# Patient Record
Sex: Female | Born: 1945
Health system: Southern US, Community
[De-identification: ages and names within clinical notes are randomized; demographics above are authoritative.]

## PROBLEM LIST (undated history)

## (undated) DIAGNOSIS — J189 Pneumonia, unspecified organism: Secondary | ICD-10-CM

## (undated) DIAGNOSIS — E669 Obesity, unspecified: Secondary | ICD-10-CM

## (undated) DIAGNOSIS — F329 Major depressive disorder, single episode, unspecified: Secondary | ICD-10-CM

## (undated) DIAGNOSIS — R011 Cardiac murmur, unspecified: Secondary | ICD-10-CM

## (undated) DIAGNOSIS — E039 Hypothyroidism, unspecified: Secondary | ICD-10-CM

## (undated) DIAGNOSIS — I10 Essential (primary) hypertension: Secondary | ICD-10-CM

## (undated) DIAGNOSIS — N200 Calculus of kidney: Secondary | ICD-10-CM

## (undated) DIAGNOSIS — M1990 Unspecified osteoarthritis, unspecified site: Secondary | ICD-10-CM

## (undated) DIAGNOSIS — N3281 Overactive bladder: Secondary | ICD-10-CM

## (undated) DIAGNOSIS — F32A Depression, unspecified: Secondary | ICD-10-CM

## (undated) DIAGNOSIS — K219 Gastro-esophageal reflux disease without esophagitis: Secondary | ICD-10-CM

## (undated) DIAGNOSIS — K5790 Diverticulosis of intestine, part unspecified, without perforation or abscess without bleeding: Secondary | ICD-10-CM

## (undated) DIAGNOSIS — R7301 Impaired fasting glucose: Secondary | ICD-10-CM

## (undated) HISTORY — DX: Major depressive disorder, single episode, unspecified: F32.9

## (undated) HISTORY — DX: Diverticulosis of intestine, part unspecified, without perforation or abscess without bleeding: K57.90

## (undated) HISTORY — DX: Overactive bladder: N32.81

## (undated) HISTORY — DX: Hypothyroidism, unspecified: E03.9

## (undated) HISTORY — DX: Impaired fasting glucose: R73.01

## (undated) HISTORY — DX: Depression, unspecified: F32.A

## (undated) HISTORY — DX: Obesity, unspecified: E66.9

## (undated) HISTORY — PX: APPENDECTOMY: SHX54

## (undated) HISTORY — PX: TONSILLECTOMY: SUR1361

---

## 1973-05-05 HISTORY — PX: THYROIDECTOMY: SHX17

## 1998-01-08 ENCOUNTER — Emergency Department (HOSPITAL_COMMUNITY): Admission: EM | Admit: 1998-01-08 | Discharge: 1998-01-08 | Payer: Self-pay | Admitting: Emergency Medicine

## 1998-10-30 ENCOUNTER — Encounter: Payer: Self-pay | Admitting: Emergency Medicine

## 1998-10-30 ENCOUNTER — Inpatient Hospital Stay (HOSPITAL_COMMUNITY): Admission: EM | Admit: 1998-10-30 | Discharge: 1998-11-02 | Payer: Self-pay | Admitting: Emergency Medicine

## 1998-10-30 ENCOUNTER — Encounter: Payer: Self-pay | Admitting: Specialist

## 2000-05-05 HISTORY — PX: ORIF FOREARM FRACTURE: SHX2124

## 2003-05-23 ENCOUNTER — Encounter: Admission: RE | Admit: 2003-05-23 | Discharge: 2003-05-23 | Payer: Self-pay | Admitting: Family Medicine

## 2004-09-26 ENCOUNTER — Other Ambulatory Visit: Admission: RE | Admit: 2004-09-26 | Discharge: 2004-09-26 | Payer: Self-pay | Admitting: Family Medicine

## 2005-09-02 DIAGNOSIS — R7301 Impaired fasting glucose: Secondary | ICD-10-CM | POA: Insufficient documentation

## 2005-09-02 HISTORY — DX: Impaired fasting glucose: R73.01

## 2005-10-06 ENCOUNTER — Ambulatory Visit: Payer: Self-pay | Admitting: Family Medicine

## 2005-11-10 ENCOUNTER — Ambulatory Visit: Payer: Self-pay | Admitting: Family Medicine

## 2006-01-19 ENCOUNTER — Encounter: Admission: RE | Admit: 2006-01-19 | Discharge: 2006-01-19 | Payer: Self-pay | Admitting: Family Medicine

## 2006-01-19 ENCOUNTER — Ambulatory Visit: Payer: Self-pay | Admitting: Family Medicine

## 2006-05-18 ENCOUNTER — Encounter: Admission: RE | Admit: 2006-05-18 | Discharge: 2006-05-18 | Payer: Self-pay | Admitting: Family Medicine

## 2006-05-18 ENCOUNTER — Ambulatory Visit: Payer: Self-pay | Admitting: Family Medicine

## 2006-06-08 ENCOUNTER — Ambulatory Visit: Payer: Self-pay | Admitting: Family Medicine

## 2006-06-29 ENCOUNTER — Ambulatory Visit (HOSPITAL_COMMUNITY): Admission: RE | Admit: 2006-06-29 | Discharge: 2006-06-29 | Payer: Self-pay | Admitting: Orthopedic Surgery

## 2006-08-10 ENCOUNTER — Ambulatory Visit: Payer: Self-pay | Admitting: Family Medicine

## 2006-11-26 ENCOUNTER — Other Ambulatory Visit: Admission: RE | Admit: 2006-11-26 | Discharge: 2006-11-26 | Payer: Self-pay | Admitting: Family Medicine

## 2006-11-26 ENCOUNTER — Ambulatory Visit: Payer: Self-pay | Admitting: Family Medicine

## 2007-05-20 ENCOUNTER — Ambulatory Visit: Payer: Self-pay | Admitting: Family Medicine

## 2007-06-21 ENCOUNTER — Ambulatory Visit: Payer: Self-pay | Admitting: Family Medicine

## 2007-08-02 ENCOUNTER — Ambulatory Visit: Payer: Self-pay | Admitting: Family Medicine

## 2007-09-06 ENCOUNTER — Ambulatory Visit: Payer: Self-pay | Admitting: Family Medicine

## 2007-12-29 ENCOUNTER — Ambulatory Visit: Payer: Self-pay | Admitting: Family Medicine

## 2008-04-17 ENCOUNTER — Ambulatory Visit: Payer: Self-pay | Admitting: Family Medicine

## 2008-07-27 ENCOUNTER — Ambulatory Visit: Payer: Self-pay | Admitting: Family Medicine

## 2008-11-30 ENCOUNTER — Ambulatory Visit: Payer: Self-pay | Admitting: Family Medicine

## 2009-07-25 ENCOUNTER — Ambulatory Visit: Payer: Self-pay | Admitting: Physician Assistant

## 2009-12-19 ENCOUNTER — Ambulatory Visit: Payer: Self-pay | Admitting: Family Medicine

## 2009-12-27 ENCOUNTER — Ambulatory Visit: Payer: Self-pay | Admitting: Family Medicine

## 2010-01-14 ENCOUNTER — Ambulatory Visit: Payer: Self-pay | Admitting: Physician Assistant

## 2010-03-07 ENCOUNTER — Ambulatory Visit: Payer: Self-pay | Admitting: Family Medicine

## 2010-05-07 ENCOUNTER — Ambulatory Visit: Admit: 2010-05-07 | Payer: Self-pay | Admitting: Family Medicine

## 2010-05-13 ENCOUNTER — Ambulatory Visit
Admission: RE | Admit: 2010-05-13 | Discharge: 2010-05-13 | Payer: Self-pay | Source: Home / Self Care | Attending: Family Medicine | Admitting: Family Medicine

## 2010-08-01 ENCOUNTER — Ambulatory Visit: Payer: Self-pay | Admitting: Family Medicine

## 2010-08-28 ENCOUNTER — Ambulatory Visit: Payer: Self-pay | Admitting: Family Medicine

## 2010-08-29 ENCOUNTER — Ambulatory Visit: Payer: Self-pay | Admitting: Family Medicine

## 2010-09-09 ENCOUNTER — Ambulatory Visit (INDEPENDENT_AMBULATORY_CARE_PROVIDER_SITE_OTHER): Payer: PRIVATE HEALTH INSURANCE | Admitting: Family Medicine

## 2010-09-09 DIAGNOSIS — E039 Hypothyroidism, unspecified: Secondary | ICD-10-CM

## 2010-09-09 DIAGNOSIS — E559 Vitamin D deficiency, unspecified: Secondary | ICD-10-CM

## 2010-09-09 DIAGNOSIS — Z79899 Other long term (current) drug therapy: Secondary | ICD-10-CM

## 2010-09-11 ENCOUNTER — Telehealth: Payer: Self-pay | Admitting: *Deleted

## 2010-09-11 NOTE — Telephone Encounter (Signed)
Spoke with pt ZO:XWRU. Notifed her that her CMP was normal as well as her vit d level. TSH was low, levothyroxine decreased from 200mg  to , called to [facility] [address] @ [phone]. Pt is scheduled for CPE 10/21/10 and we will recheck TSH at that appt.

## 2010-09-23 ENCOUNTER — Telehealth: Payer: Self-pay | Admitting: Family Medicine

## 2010-09-23 ENCOUNTER — Other Ambulatory Visit: Payer: Self-pay | Admitting: Family Medicine

## 2010-09-23 DIAGNOSIS — E039 Hypothyroidism, unspecified: Secondary | ICD-10-CM

## 2010-09-23 MED ORDER — SYNTHROID 175 MCG PO TABS: 175.0000 ug | ORAL_TABLET | Freq: Every day | ORAL | Status: DC

## 2010-09-24 NOTE — Telephone Encounter (Signed)
HANDLED BY DR Lynelle Doctor

## 2010-10-16 ENCOUNTER — Encounter: Payer: Self-pay | Admitting: *Deleted

## 2010-10-21 ENCOUNTER — Encounter: Payer: Self-pay | Admitting: Family Medicine

## 2010-10-21 ENCOUNTER — Ambulatory Visit (INDEPENDENT_AMBULATORY_CARE_PROVIDER_SITE_OTHER): Payer: PRIVATE HEALTH INSURANCE | Admitting: Family Medicine

## 2010-10-21 VITALS — BP 150/80 | HR 68 | Ht 65.0 in | Wt 263.0 lb

## 2010-10-21 DIAGNOSIS — M25561 Pain in right knee: Secondary | ICD-10-CM

## 2010-10-21 DIAGNOSIS — E039 Hypothyroidism, unspecified: Secondary | ICD-10-CM | POA: Insufficient documentation

## 2010-10-21 DIAGNOSIS — R319 Hematuria, unspecified: Secondary | ICD-10-CM

## 2010-10-21 DIAGNOSIS — M25569 Pain in unspecified knee: Secondary | ICD-10-CM

## 2010-10-21 DIAGNOSIS — Z0000 Encounter for general adult medical examination without abnormal findings: Secondary | ICD-10-CM

## 2010-10-21 LAB — POCT URINALYSIS DIPSTICK
Bilirubin, UA: NEGATIVE
Glucose, UA: NEGATIVE
Ketones, UA: NEGATIVE
Spec Grav, UA: 1.01
Urobilinogen, UA: NEGATIVE

## 2010-10-21 NOTE — Progress Notes (Signed)
  Subjective:    Patient ID: Katie Kaiser, female    DOB: 09/01/1945, 65 y.o.   MRN: [id number]  HPI Patient was originally scheduled for a CPE, but is running short on time and would prefer to get her knee injected today rather than her physical.  She is also here to get repeat thyroid bloodwork.  Dose was decreased to 6 weeks ago.  Denies any change in how she feels since lowering the dose.  Has had urinary leakage/incontinence at night for the last few weeks.  Getting up 3 times per night to void, which is typical for her.  Denies dysuria, fevers.  No h/o hematuria in the past.  Complaining of Right knee pain.  Stopped taking the Naproxen because she found the tylenol arthritis to be just as helpful.  Helps ease the pain, but still is limping, and would like cortisone shot.  Has had 2 cortisone shots previously with good results.  Hasn't been able to exercise due to the pain.  Previously loved to use the elliptical and she would like to restart.  Last knee x-ray was in 2005 (recommended to have more recently, but patient never did).  Showed mild medial degenerative changes   Review of Systems Weight gain noted by [place] (not by her).  Denies GI problems, fatigue, palpitations, fevers, skin concerns.  + knee pain    Objective:   Physical Exam BP 150/80  Pulse 68  Ht 5\' 5"  (1.651 m)  Wt 263 lb (119.296 kg)  BMI 43.77 kg/m2  LMP 05/05/1990 Well developed female, does not appear to be in any distress while sitting, moderate discomfort and limping with walking Back:  No CVA tenderness or spinal tenderness Abdomen:  No suprapubic tenderness Knees:  No effusions, warmth.  Mild crepitus on R.  FROM.  No pain with ROM, valgus or varus stress, no pain at joint lines.  Neuro:  Antalgic gait, otherwise grossly intact       Assessment & Plan:   1. Knee pain, right  PR DRAIN/INJECT LARGE JOINT/BURSA   known mild degenerative changes (last x-ray 2005)  2. Unspecified hypothyroidism  TSH    due for repeat TSH since dose adjusted 6 weeks ago.   3. Hematuria  Urine Culture  4. Routine general medical examination at a health care facility  POCT urinalysis dipstick, Visual acuity screening   R knee pain--likely due to arthritis.  Pt desires cortisone shot--this will be third injection for this joint.  Will need referral to ortho for evaluation if having ongoing pain.  Encouraged weight loss and elliptical or stationary bicycle for exercise.  Procedure note:  Patient verbally consented to procedure, stating understanding of risks and alternatives.  Right superolateral knee was cleansed with betadine swabs x 3, then used Ethyl Chloride to cool the skin.  Injected 30mg  of Kenalog and 4cc of 2% lidocaine and 4cc of Marcaine into R knee joint.  Patient tolerated the procedure without complications.  She noted significant improvement in her pain after injection, and walked normally down the hall to [employer].  Hematuria--some change in bladder symptoms.  Rule out infection.  Hypothyroidism: Has 1 pill left.  Takes branded Synthroid--will need refill sent to [facility] on [address] when results come in tomorrow--done

## 2010-10-21 NOTE — Patient Instructions (Signed)
Wear and Tear Disorders of the Knee Arthritis, Osteoarthritis Everyone will experience wear and tear injuries (arthritis, osteoarthritis) of the knee. These are the changes we all get as we age. They come from the joint stress of daily living. The amount of cartilage damage in your knee and your symptoms determine if you need surgery. Mild problems require approximately two months recovery time. More severe problems take several months to recover. With mild problems, your surgeon may find worn and rough cartilage surfaces. With severe changes, your surgeon may find cartilage that has completely worn away and exposed the bone. Loose bodies of bone and cartilage, bone spurs (excess bone growth), and injuries to the menisci (cushions between the large bones of your leg) are also common. All of these problems can cause pain. For a mild wear and tear problem, rough cartilage may simply need to be shaved and smoothed. For more severe problems with areas of exposed bone, your surgeon may use an instrument for roughing up the bone surfaces to stimulate new cartilage growth. Loose bodies are most often removed. Torn menisci may be trimmed or repaired.     CALL us IMMEDIATELY IF YOU DEVELOP ANY REDNESS OR WARMTH AT INJECTION SITE, OR  FEVER >101

## 2010-10-22 ENCOUNTER — Telehealth: Payer: Self-pay | Admitting: *Deleted

## 2010-10-22 LAB — TSH: TSH: 3.207 u[IU]/mL (ref 0.350–4.500)

## 2010-10-22 MED ORDER — SYNTHROID 175 MCG PO TABS: 175.0000 ug | ORAL_TABLET | Freq: Every day | ORAL | Status: DC

## 2010-10-22 NOTE — Telephone Encounter (Signed)
Pt notified that TSH was normal and rx for synthroid was sent to pharmacy.

## 2010-10-22 NOTE — Telephone Encounter (Signed)
Message copied by Melonie [place] on Tue Oct 22, 2010  8:34 AM ------      Message from: Joselyn Arrow      Created: Tue Oct 22, 2010  8:18 AM       Advise pt TSH normal and that I sent rx refill to pharmacy.  Plan to re-check in about 3 months (will discuss at her CPE)

## 2010-10-23 LAB — URINE CULTURE: Colony Count: 50000

## 2010-10-24 ENCOUNTER — Telehealth: Payer: Self-pay | Admitting: *Deleted

## 2010-10-24 NOTE — Telephone Encounter (Signed)
Advise--culture showed some bacteria, but a few different types, NOT indicative of an infection.  Doesn't require ABX.  Will re-check u/a at her next visit

## 2010-10-24 NOTE — Telephone Encounter (Signed)
Patient called requesting her urine culture results.

## 2010-10-24 NOTE — Telephone Encounter (Signed)
Patient notified that urine culture showed bacteria, but nothing specific to infection that would constitute ABX treatment. Dr.Knapp will recheck at patient's already scheduled CPE. Patient verbalized understanding.

## 2010-11-14 ENCOUNTER — Emergency Department (HOSPITAL_COMMUNITY)
Admission: EM | Admit: 2010-11-14 | Discharge: 2010-11-14 | Disposition: A | Discharge status: Home / Self Care | Payer: PRIVATE HEALTH INSURANCE | Attending: Emergency Medicine | Admitting: Emergency Medicine

## 2010-11-14 ENCOUNTER — Emergency Department (HOSPITAL_COMMUNITY): Payer: PRIVATE HEALTH INSURANCE

## 2010-11-14 DIAGNOSIS — X58XXXA Exposure to other specified factors, initial encounter: Secondary | ICD-10-CM | POA: Insufficient documentation

## 2010-11-14 DIAGNOSIS — E0590 Thyrotoxicosis, unspecified without thyrotoxic crisis or storm: Secondary | ICD-10-CM | POA: Insufficient documentation

## 2010-11-14 DIAGNOSIS — S9030XA Contusion of unspecified foot, initial encounter: Secondary | ICD-10-CM | POA: Insufficient documentation

## 2010-11-14 DIAGNOSIS — M7730 Calcaneal spur, unspecified foot: Secondary | ICD-10-CM | POA: Insufficient documentation

## 2010-11-14 DIAGNOSIS — Y93H9 Activity, other involving exterior property and land maintenance, building and construction: Secondary | ICD-10-CM | POA: Insufficient documentation

## 2010-11-18 ENCOUNTER — Encounter: Payer: Self-pay | Admitting: Family Medicine

## 2010-11-18 ENCOUNTER — Ambulatory Visit (INDEPENDENT_AMBULATORY_CARE_PROVIDER_SITE_OTHER): Payer: PRIVATE HEALTH INSURANCE | Admitting: Family Medicine

## 2010-11-18 ENCOUNTER — Ambulatory Visit: Payer: PRIVATE HEALTH INSURANCE | Admitting: Family Medicine

## 2010-11-18 VITALS — BP 140/80 | HR 72 | Ht 65.0 in | Wt 249.0 lb

## 2010-11-18 DIAGNOSIS — M722 Plantar fascial fibromatosis: Secondary | ICD-10-CM

## 2010-11-18 NOTE — Progress Notes (Signed)
Patient presents with complaint of L heel pain and bone spur.  See recent ER visit.  Has had pain in L foot for about 3 weeks.  Hurts in the heel with walking.  Denies pain upon getting out of bed in the morning.  Seems worse as she is on her feet throughout the day, and is limping by the end of the day.  She has tried icing the foot (temporarily helps), and was rx'd 800mg  of ibuprofen in ER, and has been taking three times daily since ER visit 4-5 days ago.  She doesn't see much improvement.  She was also given Vicodin, which she takes at bedtime, when pain is at its worst.   She wears [employer], which has a good arch support, and has been using a heel cup without benefit.  She would like a cortisone shot today.  Her knee pain has resolved s/p the last cortisone injection at her last visit here.  Past Medical History  Diagnosis Date  . Hypothyroid   . Depression   . Impaired fasting glucose 5/07  . Obesity   . Vitamin D deficiency     Past Surgical History  Procedure Date  . Thyroidectomy 1975  . Tonsillectomy   . Appendectomy   . Cesarean section     History   Social History  . Marital Status: Divorced    Spouse Name: N/A    Number of Children: N/A  . Years of Education: N/A   Occupational History  . Not on file.   Social History Main Topics  . Smoking status: Former Smoker    Quit date: 05/05/1978  . Smokeless tobacco: Not on file  . Alcohol Use: Yes     1-2 wine coolers once a week  . Drug Use: No  . Sexually Active: Not on file   Other Topics Concern  . Not on file   Social History Narrative  . No narrative on file    No family history on file.  Current outpatient prescriptions:Cholecalciferol (VITAMIN D) 1000 UNITS capsule, Take 1,000 Units by mouth daily.  , Disp: , Rfl: ;  fish oil-omega-3 fatty acids 1000 MG capsule, Take 2 g by mouth daily.  , Disp: , Rfl: ;  FLUoxetine (PROZAC) 20 MG tablet, Take 20 mg by mouth daily.  , Disp: , Rfl: ;   HYDROcodone-acetaminophen (VICODIN) 5-500 MG per tablet, Take 1 tablet by mouth at bedtime.  , Disp: , Rfl:  ibuprofen (ADVIL,MOTRIN) 800 MG tablet, Take 800 mg by mouth 3 (three) times daily.  , Disp: , Rfl: ;  SYNTHROID 175 MCG tablet, Take 1 tablet (175 mcg total) by mouth daily., Disp: 30 tablet, Rfl: 5  No Known Allergies  ROS: no fever, rash, other joint pain.  She has lost 14 pounds.  Denies GI problems or other concerns. She is scheduled for CPE later this week  PHYSICAL EXAM: BP 140/80  Pulse 72  Ht 5\' 5"  (1.651 m)  Wt 249 lb (112.946 kg)  BMI 41.44 kg/m2  LMP 05/05/1990 Well developed, pleasant, obese female in no distress at rest.  Limping and moderate discomfort during exam L foot--2+ pulse.  No edema.  Tender at plantar fascia insertion at medial calcaneus.   She brings in copy of her x-ray, showing calcaneal bone spur  Procedure:  Medial aspect of foot was cleansed with alcohol x 3.  Then injected 20mg  of Kenalog, along with 1cc of 2% lidocaine without epi and 1cc of Marcaine.  Injected through the medial aspect of the foot, aimed towards the point of maximal tenderness over medial calcaneous.  Patient tolerated the procedure well, with improvement of pain on direct palpation afterwards.  ASSESSMENT/PLAN: 1. Plantar fasciitis  PR INJECT TENDON SHEATH/LIGAMENT   left   S/p Cortisone shot today.  Continue heel cup.  Shown PF stretches.  Will refer to podiatrist if having ongoing problems  F/u as scheduled for CPE later this week

## 2010-11-18 NOTE — Patient Instructions (Signed)

## 2010-11-21 ENCOUNTER — Ambulatory Visit (INDEPENDENT_AMBULATORY_CARE_PROVIDER_SITE_OTHER): Payer: PRIVATE HEALTH INSURANCE | Admitting: Family Medicine

## 2010-11-21 ENCOUNTER — Other Ambulatory Visit (HOSPITAL_COMMUNITY)
Admission: RE | Admit: 2010-11-21 | Discharge: 2010-11-21 | Disposition: A | Discharge status: Home / Self Care | Payer: PRIVATE HEALTH INSURANCE | Source: Ambulatory Visit | Attending: Family Medicine | Admitting: Family Medicine

## 2010-11-21 ENCOUNTER — Encounter: Payer: Self-pay | Admitting: Family Medicine

## 2010-11-21 VITALS — BP 146/80 | HR 72 | Ht 65.0 in | Wt 250.0 lb

## 2010-11-21 DIAGNOSIS — M722 Plantar fascial fibromatosis: Secondary | ICD-10-CM

## 2010-11-21 DIAGNOSIS — Z0000 Encounter for general adult medical examination without abnormal findings: Secondary | ICD-10-CM

## 2010-11-21 DIAGNOSIS — R030 Elevated blood-pressure reading, without diagnosis of hypertension: Secondary | ICD-10-CM

## 2010-11-21 DIAGNOSIS — E039 Hypothyroidism, unspecified: Secondary | ICD-10-CM

## 2010-11-21 DIAGNOSIS — IMO0001 Reserved for inherently not codable concepts without codable children: Secondary | ICD-10-CM

## 2010-11-21 DIAGNOSIS — E559 Vitamin D deficiency, unspecified: Secondary | ICD-10-CM

## 2010-11-21 DIAGNOSIS — Z01419 Encounter for gynecological examination (general) (routine) without abnormal findings: Secondary | ICD-10-CM | POA: Insufficient documentation

## 2010-11-21 LAB — POCT URINALYSIS DIPSTICK
Bilirubin, UA: NEGATIVE
Blood, UA: NEGATIVE
Nitrite, UA: NEGATIVE
Protein, UA: NEGATIVE
pH, UA: 6

## 2010-11-21 LAB — LIPID PANEL
HDL: 50 mg/dL (ref >39)
LDL Cholesterol: 94 mg/dL (ref 0–99)

## 2010-11-21 MED ORDER — HYDROCODONE-ACETAMINOPHEN 5-500 MG PO TABS: 1.0000 | ORAL_TABLET | Freq: Four times a day (QID) | ORAL | Status: DC | PRN

## 2010-11-21 NOTE — Progress Notes (Signed)
Katie Kaiser is a 65 y.o. female who presents for a complete physical.  She has the following concerns: f/u L plantar fasciitis/heel spur.  Had injection Monday, some of the pain is improved, but still having significant pain.  Would like refill on Vicodin--helped to take at bedtime (doesn't take during the day).  Taking Aleve 2 pills BID during the day.   Immunization History  Administered Date(s) Administered  . DTaP 08/02/2007  . Td 10/09/1997  Gets flu shots annually Last Pap smear: 7/08 Last mammogram: 08/2010 Last colonoscopy: greater than 10 years ago (when hospitalized) Last DEXA: never Ophtho: never Dentist: 1.5 years ago Exercise: limited by pain from PF.  On her feet all day working as Producer, television/film/video  C-met, and Vitamin D were done and normal in May 2012.  TSH normal in 10/2010 (after dose adjustment)  Past Medical History  Diagnosis Date  . Hypothyroid   . Depression   . Impaired fasting glucose 5/07  . Obesity   . Vitamin D deficiency     Past Surgical History  Procedure Date  . Thyroidectomy 1975  . Tonsillectomy   . Appendectomy   . Cesarean section   . Orif forearm fracture 2002    History   Social History  . Marital Status: Divorced    Spouse Name: N/A    Number of Children: N/A  . Years of Education: N/A   Occupational History  . Not on file.   Social History Main Topics  . Smoking status: Former Smoker    Quit date: 05/05/1978  . Smokeless tobacco: Never Used  . Alcohol Use: No  . Drug Use: No  . Sexually Active: Not Currently   Other Topics Concern  . Not on file   Social History Narrative  . No narrative on file    Family History  Problem Relation Age of Onset  . Parkinsonism Mother   . Diabetes Brother   . Heart disease Brother   . Cancer Neg Hx   . HIV Brother    Current Outpatient Prescriptions on File Prior to Visit  Medication Sig Dispense Refill  . Cholecalciferol (VITAMIN D) 1000 UNITS capsule Take 1,000 Units by  mouth daily.        . fish oil-omega-3 fatty acids 1000 MG capsule Take 2 g by mouth daily.        Marland Kitchen FLUoxetine (PROZAC) 20 MG tablet Take 20 mg by mouth daily.        Marland Kitchen SYNTHROID 175 MCG tablet Take 1 tablet (175 mcg total) by mouth daily.  30 tablet  5  . ibuprofen (ADVIL,MOTRIN) 800 MG tablet Take 800 mg by mouth 3 (three) times daily.          No Known Allergies  ROS: The patient denies anorexia, fever,  headaches, decreased hearing, ear pain, sore throat, breast concerns, chest pain, palpitations, dizziness, syncope, dyspnea on exertion, cough, swelling, nausea, vomiting, diarrhea, constipation, abdominal pain, melena, hematochezia, indigestion/heartburn, hematuria, incontinence, dysuria, vaginal bleeding, discharge, odor or itch, genital lesions, joint pains, numbness, tingling, weakness, tremor, suspicious skin lesions, depression, anxiety, abnormal bleeding/bruising, or enlarged lymph nodes.  Urinary incontinence improved since cutting back fluids in the evenings  PHYSICAL EXAM: BP 146/80  Pulse 72  Ht 5\' 5"  (1.651 m)  Wt 250 lb (113.399 kg)  BMI 41.60 kg/m2  LMP 05/05/1990  General Appearance:    Alert, cooperative, obese female in no distress, appears stated age. Exam interrupted 3 times by phone calls which patient  answered  Head:    Normocephalic, without obvious abnormality, atraumatic  Eyes:    PERRL, conjunctiva/corneas clear, EOM's intact, fundi    benign  Ears:    Normal TM's and external ear canals  Nose:   Nares normal, mucosa normal, no drainage or sinus   tenderness  Throat:   Lips, mucosa, and tongue normal; teeth and gums normal  Neck:   Supple, no lymphadenopathy;  thyroid:  no   enlargement/tenderness/nodules; no carotid   bruit or JVD  Back:    Spine nontender, no curvature, ROM normal, no CVA     tenderness  Lungs:     Clear to auscultation bilaterally without wheezes, rales or     ronchi; respirations unlabored  Chest Wall:    No tenderness or deformity    Heart:    Regular rate and rhythm, S1 and S2 normal, no murmur, rub   or gallop  Breast Exam:    No tenderness, masses, or nipple discharge or inversion.      No axillary lymphadenopathy  Abdomen:     Soft, obese, non-tender, nondistended, normoactive bowel sounds, no masses, no hepatosplenomegaly  Genitalia:    Normal external genitalia without lesions.  BUS and vagina normal; cervix without lesions, or cervical motion tenderness. No abnormal vaginal discharge.  Exam limited by body habitus--no pelvic masses appreciable. Pap performed  Rectal:    Normal tone, no masses or tenderness; guaiac negative stool  Extremities:   No clubbing, cyanosis or edema.  No tenderness at PF insertion at calcaneous  Pulses:   2+ and symmetric all extremities  Skin:   Skin color, texture, turgor normal, no rashes or lesions  Lymph nodes:   Cervical, supraclavicular, and axillary nodes normal  Neurologic:   CNII-XII intact, normal strength, sensation and gait; reflexes 2+ and symmetric throughout          Psych:   Normal mood, affect, hygiene and grooming.    ASSESSMENT/PLAN: 1. Routine general medical examination at a health care facility  Visual acuity screening, POCT urinalysis dipstick, Ambulatory referral to Gastroenterology, Lipid panel, Cytology - PAP  2. Unspecified vitamin D deficiency  DG Bone Density  3. Unspecified hypothyroidism  DG Bone Density  4. Plantar fasciitis  HYDROcodone-acetaminophen (VICODIN) 5-500 MG per tablet  5. Elevated BP     PF--improved; nontender on exam today.  rx Vicodin since patient still having significant pain at night.  Expect to need short-term only.  Continue with Aleve 2 BID during day prn  Elevated BP--discussed need for low sodium diet, daily exercise and weight loss.  Check BP elsewhere Eye exam recommended (poor vision today; routine screening Routine dental visits recommended TSH due in September or October Colonoscopy referral [facility] D[facility] at [facility]  Discussed  monthly self breast exams and yearly mammograms after the age of 35; at least 30 minutes of aerobic activity at least 5 days/week; proper sunscreen use reviewed; healthy diet, including goals of calcium and vitamin D intake and alcohol recommendations (less than or equal to 1 drink/day) reviewed; regular seatbelt use; changing batteries in smoke detectors.  Immunization recommendations discussed--flu shots annually.  Will have pt look into insurance coverage for Zostavax.  Colonoscopy recommendations reviewed--pt willing to have now (past due)

## 2010-11-21 NOTE — Patient Instructions (Addendum)
Low sodium diet; start exercising; continue weight loss.  Check BP at pharmacy at least once a week if you can;  Follow up here in 2 months with list of blood pressures (we'll also do thyroid testing at visit)  Exercise 30-6 minutes daily, at least 5 days Ophtho (poor vision today; routine screening)--please schedule an eye appointment Routine dental visits recommended TSH in September or October We will be scheduling colonoscopy for you We will be scheduling Bone Density test for you   2 Gram Low Sodium Diet A 2 gram sodium diet restricts the amount of sodium in the diet to no more than 2 grams (g) or 2000 milligrams (mg) daily. Limiting the amount of sodium is often used to help lower blood pressure. It is important if you have heart, liver, or kidney problems. Many foods contain sodium for flavor and sometimes as a preservative. When the amount of sodium in a diet needs to be low, it is important to know what to look for when choosing foods and drinks. The following includes some information and guidelines to help make it easier for you to adapt to a low sodium diet. QUICK TIPS  Do not add salt to food.   Avoid convenience items and fast food.   Choose unsalted snack foods.   Buy lower sodium products, often labeled as "lower sodium" or "no salt added."   Check food labels to learn how much sodium is in 1 serving.   When eating at a restaurant, ask that your food be prepared with less salt or none, if possible.  READING FOOD LABELS FOR SODIUM INFORMATION The nutrition facts label is a good place to find how much sodium is in foods. Look for products with no more than 500 to 600 mg of sodium per meal and no more than 150 mg per serving. Remember that 2 g = 2000 mg. The food label may also list foods as:  Sodium-free: Less than 5 mg in a serving.   Very low sodium: 35 mg or less in a serving.   Low-sodium: 140 mg or less in a serving.   Light in sodium: 50% less sodium in a  serving. For example, if a food that usually has 300 mg of sodium is changed to become light in sodium, it will have 150 mg of sodium.   Reduced sodium: 25% less sodium in a serving. For example, if a food that usually has 400 mg of sodium is changed to reduced sodium, it will have 300 mg of sodium.  CHOOSING FOODS AVOID CHOOSE  Grains: Salted crackers and snack items. Some cereals, including instant hot cereals. Bread stuffing and biscuit mixes. Seasoned rice or pasta mixes. Grains: Unsalted snack items. Low-sodium cereals, oats, puffed wheat and rice, shredded wheat. English muffins and bread. Pasta.  Meats: Salted, canned, smoked, spiced, or pickled meats, including fish and poultry. Bacon, ham, sausage, cold cuts, hot dogs, anchovies. Meats: Low-sodium canned tuna and salmon. Fresh or frozen meat, poultry, and fish.  Dairy: Processed cheese and spreads. Cottage cheese. Buttermilk and condensed milk. Regular cheese. Dairy: Milk. Low-sodium cottage cheese. Yogurt. Sour cream. Low-sodium cheese.  Fruits and Vegetables: Regular canned vegetables. Regular canned tomato sauce and paste. Frozen vegetables in sauces. Olives. Rosita Fire. Relishes. Sauerkraut. Fruits and Vegetables: Low-sodium canned vegetables. Low-sodium tomato sauce and paste. Fresh and frozen vegetables. Fresh and frozen fruit.  Condiments: Canned and packaged gravies. Worcestershire sauce. Tartar sauce. Barbecue sauce. Soy sauce. Steak sauce. Ketchup. Onion, garlic, and table salt.  Meat flavorings and tenderizers. Condiments: Fresh and dried herbs and spices. Low-sodium varieties of mustard and ketchup. Lemon juice. Tabasco sauce. Horseradish.  SAMPLE 2 GRAM SODIUM MEAL PLAN Meal Foods Sodium (mg)  Breakfast 1 cup low-fat milk 2 slices whole-wheat toast 1 tablespoon heart-healthy margarine 1 hard-boiled egg 1 small orange [phone]  Lunch 1 cup raw carrots  cup hummus 1 cup  low-fat milk  cup red grapes 1 whole-wheat pita bread [phone]  Dinner 1 cup whole-wheat pasta 1 cup low-sodium tomato sauce 3 ounces lean ground beef 1 small side salad (1 cup raw spinach leaves,  cup cucumber,  cup yellow bell pepper) with 1 teaspoon olive oil and 1 teaspoon red wine vinegar 2 73 [phone] 25  Snack 1 container low-fat vanilla yogurt 3 graham cracker squares 107 127  Nutrient Analysis Calories: 2033 Protein: 77 g Carbohydrate: 282 g Fat: 72 g Sodium: 1971 mg Document Released: 04/21/2005 Document Re-Released: 10/09/2009 [facility] Patient Information 2011 [place], [place].

## 2010-11-22 ENCOUNTER — Telehealth: Payer: Self-pay

## 2010-11-22 NOTE — Telephone Encounter (Signed)
Left message for pt to call me back also faxed for dexa at  [facility] they will contact pt and set up appt

## 2010-11-22 NOTE — Telephone Encounter (Signed)
Pt called and has been informed and mailed copy

## 2010-11-23 LAB — HM PAP SMEAR: HM PAP: NORMAL

## 2010-11-26 ENCOUNTER — Encounter: Payer: Self-pay | Admitting: Family Medicine

## 2010-12-16 ENCOUNTER — Telehealth: Payer: Self-pay | Admitting: Family Medicine

## 2010-12-16 NOTE — Telephone Encounter (Signed)
Okay to refer to Dr. Al Corpus (he is with [facility]) for plantar fasciitis

## 2010-12-16 NOTE — Telephone Encounter (Signed)
Shot helped for 2 days, but not now.  Pt wants referral to foot specialist.  Someone told her about Dr. Al Corpus, she would like to be referred to Dr. Al Corpus if you think this would be a good fit.

## 2011-01-13 ENCOUNTER — Other Ambulatory Visit: Payer: PRIVATE HEALTH INSURANCE | Admitting: Internal Medicine

## 2011-01-22 ENCOUNTER — Ambulatory Visit: Payer: PRIVATE HEALTH INSURANCE | Admitting: Family Medicine

## 2011-01-29 ENCOUNTER — Ambulatory Visit (AMBULATORY_SURGERY_CENTER): Payer: PRIVATE HEALTH INSURANCE | Admitting: *Deleted

## 2011-01-29 VITALS — Ht 65.0 in | Wt 249.6 lb

## 2011-01-29 DIAGNOSIS — Z1211 Encounter for screening for malignant neoplasm of colon: Secondary | ICD-10-CM

## 2011-01-29 MED ORDER — PEG-KCL-NACL-NASULF-NA ASC-C 100 G PO SOLR: 1.0000 | Freq: Once | ORAL | Status: DC

## 2011-02-03 ENCOUNTER — Ambulatory Visit: Payer: PRIVATE HEALTH INSURANCE | Admitting: Family Medicine

## 2011-02-04 ENCOUNTER — Ambulatory Visit (AMBULATORY_SURGERY_CENTER): Payer: PRIVATE HEALTH INSURANCE | Admitting: Internal Medicine

## 2011-02-04 ENCOUNTER — Encounter: Payer: Self-pay | Admitting: Internal Medicine

## 2011-02-04 ENCOUNTER — Other Ambulatory Visit: Payer: PRIVATE HEALTH INSURANCE | Admitting: Internal Medicine

## 2011-02-04 VITALS — BP 132/66 | HR 67 | Temp 97.8°F | Resp 18 | Ht 66.0 in | Wt 249.0 lb

## 2011-02-04 DIAGNOSIS — Z1211 Encounter for screening for malignant neoplasm of colon: Secondary | ICD-10-CM

## 2011-02-04 HISTORY — PX: COLONOSCOPY: SHX174

## 2011-02-04 LAB — HM COLONOSCOPY

## 2011-02-04 MED ORDER — SODIUM CHLORIDE 0.9 % IV SOLN: 500.0000 mL | INTRAVENOUS | Status: DC

## 2011-02-04 NOTE — Progress Notes (Signed)
Pt was uncomfortable with the scope advancement.  Medication was titrated per Dr. Marvell Fuller orders.  Pt did relax and rested comfortably as the scope was withdrawn.  Pt's eyes closed and sleeping. maw

## 2011-02-04 NOTE — Patient Instructions (Addendum)
The colonoscopy was normal - no polyps or cancer were seen. You should have a routine repeat colonoscopy in 10 years. Iva Boop, MD, [facility] Resume all medications.

## 2011-02-05 ENCOUNTER — Telehealth: Payer: Self-pay | Admitting: *Deleted

## 2011-02-05 NOTE — Telephone Encounter (Signed)
Follow up Call- Patient questions:From beginning to the end of the procedure, everyone was wonderful.  Please thank everyone for me for my wonderful care   Do you have a fever, pain , or abdominal swelling? no Pain Score  0 *  Have you tolerated food without any problems? yes  Have you been able to return to your normal activities? yes  Do you have any questions about your discharge instructions: Diet   no Medications  no Follow up visit  no  Do you have questions or concerns about your Care? no  Actions: * If pain score is 4 or above: No action needed, pain <4.

## 2011-02-12 ENCOUNTER — Telehealth: Payer: Self-pay | Admitting: *Deleted

## 2011-02-12 NOTE — Telephone Encounter (Signed)
Patient called and wanted to let you know that she has to work late tomorrow evening so she will not be able to make her 3pm appt tomorrow. She wants you to know that everything is ok, she is doing well and she will reschedule to see you in the next 3 weeks sometime. Thanks.

## 2011-02-12 NOTE — Telephone Encounter (Signed)
noted 

## 2011-02-13 ENCOUNTER — Ambulatory Visit: Payer: PRIVATE HEALTH INSURANCE | Admitting: Family Medicine

## 2011-03-17 ENCOUNTER — Encounter: Payer: Self-pay | Admitting: Family Medicine

## 2011-03-17 ENCOUNTER — Ambulatory Visit (INDEPENDENT_AMBULATORY_CARE_PROVIDER_SITE_OTHER): Payer: PRIVATE HEALTH INSURANCE | Admitting: Family Medicine

## 2011-03-17 VITALS — BP 130/78 | HR 74 | Wt 254.0 lb

## 2011-03-17 DIAGNOSIS — S63509A Unspecified sprain of unspecified wrist, initial encounter: Secondary | ICD-10-CM

## 2011-03-17 DIAGNOSIS — IMO0002 Reserved for concepts with insufficient information to code with codable children: Secondary | ICD-10-CM

## 2011-03-17 DIAGNOSIS — S93409A Sprain of unspecified ligament of unspecified ankle, initial encounter: Secondary | ICD-10-CM

## 2011-03-17 DIAGNOSIS — S8390XA Sprain of unspecified site of unspecified knee, initial encounter: Secondary | ICD-10-CM

## 2011-03-17 DIAGNOSIS — Z2911 Encounter for prophylactic immunotherapy for respiratory syncytial virus (RSV): Secondary | ICD-10-CM

## 2011-03-17 DIAGNOSIS — S43409A Unspecified sprain of unspecified shoulder joint, initial encounter: Secondary | ICD-10-CM

## 2011-03-17 NOTE — Patient Instructions (Signed)
Use heat for 20 minutes 3 or 4 times a day. Continue on your ibuprofen and this should slowly go.

## 2011-03-17 NOTE — Progress Notes (Signed)
  Subjective:    Patient ID: Katie Kaiser, female    DOB: 02/14/1946, 65 y.o.   MRN: [id number]  HPI On November 7 she fell wall and a trip to [place] injuring her left shoulder,arm and left knee and ankle. He went to the emergency room. They did evaluate her knee and found some degenerative changes apparently did not evaluate other problem areas. She has been using ice packs as well as ibuprofen 800 mg 3 times a day and getting relief   Review of Systems     Objective:   Physical Exam Alert and in no distress. Left shoulder exam shows good motion with some palpable tenderness over the deltoid. No laxity noted. Full motion of the elbow. She is tender proximal to the wrist but no swelling in wrist itself. Good strength of her arms and hands. Left knee exam shows no effusion or laxity. Right ankle does have some slight swelling near lateral malleolus with good motion and strength.       Assessment & Plan:  Shoulder, wrist, knee and ankle sprain Supportive care with heat and ibuprofen. Continue with present medications. Zostavax also given.

## 2011-04-02 ENCOUNTER — Encounter: Payer: Self-pay | Admitting: Family Medicine

## 2011-04-02 ENCOUNTER — Ambulatory Visit (INDEPENDENT_AMBULATORY_CARE_PROVIDER_SITE_OTHER): Payer: PRIVATE HEALTH INSURANCE | Admitting: Family Medicine

## 2011-04-02 DIAGNOSIS — E039 Hypothyroidism, unspecified: Secondary | ICD-10-CM

## 2011-04-02 LAB — TSH: TSH: 1.34 u[IU]/mL (ref 0.350–4.500)

## 2011-04-02 NOTE — Patient Instructions (Signed)
We have referred you to [facility] to discuss weight loss surgical options. Continue to try and get at least 30 minutes of exercise daily (goal is 5-6 days/week), and continue lowfat diet  We are checking your thyroid today, and will notify you of results, and adjust meds if necessary

## 2011-04-02 NOTE — Progress Notes (Signed)
Patient presents for f/u on blood pressure and thyroid, and to discuss lap banding.  She had some borderline/elevated blood pressures at previous visit, but most recently they have been improved. She states that she thyroid medication dose was lowered from 200 to 175 mcg back in early May, and has felt more fatigued since then.  TSH was re-checked in mid-June and was normal.  She is very disappointed about her weight, stating that her weight, and inability to lose it, is causing her to feel depressed.  She denies depression other than related to her weight.  She wants help losing weight.  In the past she has tried various diets, took weight loss pills, joined gyms.  She lost some of the weight, but regains it.  She is now interested in pursuing lap band surgery.  She has not yet attended the seminar by [facility].  She fell 3 weeks ago while in [place], and injured the left side of her body.  Has known arthritis in her R knee. Seeing Dr. Darrelyn Hillock for her ongoing arm pain from the fall.  Prior to injury, was going to the gym 3-4 times/week, and going 4 miles on the elliptical.  She isn't snacking, doesn't eat after 6pm, avoids fried foods, and tries to avoid white foods/carbs.    Past Medical History  Diagnosis Date  . Hypothyroid   . Depression   . Impaired fasting glucose 5/07  . Obesity   . Vitamin D deficiency     Past Surgical History  Procedure Date  . Thyroidectomy 1975  . Tonsillectomy   . Appendectomy   . Cesarean section   . Orif forearm fracture 2002  . Colonoscopy 02/04/2011    diverticulosis    History   Social History  . Marital Status: Divorced    Spouse Name: N/A    Number of Children: N/A  . Years of Education: N/A   Occupational History  . Not on file.   Social History Main Topics  . Smoking status: Former Smoker    Quit date: 05/05/1978  . Smokeless tobacco: Never Used  . Alcohol Use: No  . Drug Use: No  . Sexually Active: Not Currently   Other Topics Concern  .  Not on file   Social History Narrative  . No narrative on file    Family History  Problem Relation Age of Onset  . Parkinsonism Mother   . Diabetes Brother   . Heart disease Brother   . Cancer Neg Hx   . Colon cancer Neg Hx   . Stomach cancer Neg Hx   . HIV Brother    Current Outpatient Prescriptions on File Prior to Visit  Medication Sig Dispense Refill  . aspirin 81 MG tablet Take 81 mg by mouth daily.        . Cholecalciferol (VITAMIN D) 1000 UNITS capsule Take 1,000 Units by mouth daily.        . fish oil-omega-3 fatty acids 1000 MG capsule Take 2 g by mouth daily.        Marland Kitchen FLUoxetine (PROZAC) 20 MG tablet Take 20 mg by mouth daily.        Marland Kitchen ibuprofen (ADVIL,MOTRIN) 800 MG tablet Take 800 mg by mouth 3 (three) times daily.        Marland Kitchen SYNTHROID 175 MCG tablet Take 1 tablet (175 mcg total) by mouth daily.  30 tablet  5   No Known Allergies  ROS:  +fatigue, weight gain.  Denies hair/skin/bowel changes.  Denies  chest pain, headaches, dizziness, GI complaints. + knee and L sided pain from recent fall  PHYSICAL EXAM: BP 132/86  Pulse 72  Ht 5\' 5"  (1.651 m)  Wt 256 lb (116.121 kg)  BMI 42.60 kg/m2  LMP 05/05/1990 Well developed, pleasant female in no distress Neck: no lymphadenopathy or thyromegaly Heart: regular rate and rhythm without murmur Lungs: clear bilaterally Extremities: no edema Skin: no rash  ASSESSMENT/PLAN: 1. Unspecified hypothyroidism  TSH  2. Obesity, Class III, BMI 40-49.9 (morbid obesity)  Ambulatory referral to General Surgery   65 year old obese female, who has struggled with weight loss. She has been dieting, and exercising regularly.  Perhaps there is some contribution of hypothyroidism--will check TSH and adjust dose as indicated.  Her complications from her obesity involve worsening of OA in her knee, and borderline blood pressure  She is being referred to [facility] to attend their seminar, and determine if she is a candidate for weight loss surgery.   Discussed the need to continue with diet and exercise. Try and increase exercise to 5-6 days/week.

## 2011-06-21 ENCOUNTER — Emergency Department (HOSPITAL_COMMUNITY)
Admission: EM | Admit: 2011-06-21 | Discharge: 2011-06-21 | Disposition: A | Discharge status: Home / Self Care | Payer: PRIVATE HEALTH INSURANCE | Attending: Emergency Medicine | Admitting: Emergency Medicine

## 2011-06-21 ENCOUNTER — Encounter (HOSPITAL_COMMUNITY): Payer: Self-pay | Admitting: *Deleted

## 2011-06-21 DIAGNOSIS — L03119 Cellulitis of unspecified part of limb: Secondary | ICD-10-CM | POA: Insufficient documentation

## 2011-06-21 DIAGNOSIS — L03116 Cellulitis of left lower limb: Secondary | ICD-10-CM

## 2011-06-21 DIAGNOSIS — L02419 Cutaneous abscess of limb, unspecified: Secondary | ICD-10-CM | POA: Insufficient documentation

## 2011-06-21 DIAGNOSIS — M25519 Pain in unspecified shoulder: Secondary | ICD-10-CM | POA: Insufficient documentation

## 2011-06-21 DIAGNOSIS — Z87828 Personal history of other (healed) physical injury and trauma: Secondary | ICD-10-CM | POA: Insufficient documentation

## 2011-06-21 DIAGNOSIS — F3289 Other specified depressive episodes: Secondary | ICD-10-CM | POA: Insufficient documentation

## 2011-06-21 DIAGNOSIS — E039 Hypothyroidism, unspecified: Secondary | ICD-10-CM | POA: Insufficient documentation

## 2011-06-21 DIAGNOSIS — F329 Major depressive disorder, single episode, unspecified: Secondary | ICD-10-CM | POA: Insufficient documentation

## 2011-06-21 DIAGNOSIS — M25579 Pain in unspecified ankle and joints of unspecified foot: Secondary | ICD-10-CM | POA: Insufficient documentation

## 2011-06-21 DIAGNOSIS — M2540 Effusion, unspecified joint: Secondary | ICD-10-CM | POA: Insufficient documentation

## 2011-06-21 DIAGNOSIS — M25569 Pain in unspecified knee: Secondary | ICD-10-CM | POA: Insufficient documentation

## 2011-06-21 DIAGNOSIS — M79609 Pain in unspecified limb: Secondary | ICD-10-CM | POA: Insufficient documentation

## 2011-06-21 MED ORDER — CEPHALEXIN 500 MG PO CAPS: 500.0000 mg | ORAL_CAPSULE | Freq: Four times a day (QID) | ORAL | Status: AC

## 2011-06-21 NOTE — ED Notes (Signed)
Pt in c/o left leg pain since fall in novemeber, states pain has gotten progressively worse, increased last night

## 2011-06-21 NOTE — ED Provider Notes (Signed)
History     CSN: [id number]  Arrival date & time 06/21/11  1343   First MD Initiated Contact with Patient 06/21/11 1354      Chief Complaint  Patient presents with  . Leg Pain    (Consider location/radiation/quality/duration/timing/severity/associated sxs/prior treatment) Patient is a 66 y.o. female presenting with leg pain. The history is provided by the patient.  Leg Pain  The incident occurred more than 2 days ago.  Pt states she fell in November. Since then she has been evaluated multiple times by her orthopedist and is currently doing some PT for injury to left arm and left leg. She is still having some pain in left ankle, left knee, left shoulder since that fall. No new injuries. She states about 4 days ago noticed a small area of redness to left anterior shin. States this redness has gotten slightly larger and now has some harderning to the touch over that area. States when she was in PT, she was told "you may want to get this checked out to make sure its not a blood clot." Pt states she showed this to her orthopedist who did not think anything of it. States today area is a little more painful so she wanted to get it checked. She denies fever, chills, pain to the calf, recent surgeries, recent travel. States she is on the go everyday, works 6 days a week, not sure if maybe bumped this are on something. No other complaints.   Past Medical History  Diagnosis Date  . Hypothyroid   . Depression   . Impaired fasting glucose 5/07  . Obesity   . Vitamin d deficiency     Past Surgical History  Procedure Date  . Thyroidectomy 1975  . Tonsillectomy   . Appendectomy   . Cesarean section   . Orif forearm fracture 2002  . Colonoscopy 02/04/2011    diverticulosis    Family History  Problem Relation Age of Onset  . Parkinsonism Mother   . Diabetes Brother   . Heart disease Brother   . Cancer Neg Hx   . Colon cancer Neg Hx   . Stomach cancer Neg Hx   . HIV Brother     History   Substance Use Topics  . Smoking status: Former Smoker    Quit date: 05/05/1978  . Smokeless tobacco: Never Used  . Alcohol Use: No    OB History    Grav Para Term Preterm Abortions TAB SAB Ect Mult Living                  Review of Systems  Constitutional: Negative for fever and chills.  HENT: Negative.   Eyes: Negative.   Respiratory: Negative.   Cardiovascular: Negative.   Gastrointestinal: Negative.   Genitourinary: Negative.   Musculoskeletal: Positive for joint swelling and arthralgias.  Skin: Positive for color change.  Neurological: Negative.   Psychiatric/Behavioral: Negative.     Allergies  Review of patient's allergies indicates no known allergies.  Home Medications   Current Outpatient Rx  Name Route Sig Dispense Refill  . ASPIRIN 81 MG PO TABS Oral Take 81 mg by mouth daily.      Marland Kitchen VITAMIN D 1000 UNITS PO CAPS Oral Take 1,000 Units by mouth daily.      . OMEGA-3 FATTY ACIDS 1000 MG PO CAPS Oral Take 2 g by mouth daily.      Marland Kitchen FLUOXETINE HCL 20 MG PO TABS Oral Take 20 mg by mouth daily.      Marland Kitchen  IBUPROFEN 800 MG PO TABS Oral Take 800 mg by mouth 3 (three) times daily.      Marland Kitchen SYNTHROID 175 MCG PO TABS Oral Take 1 tablet (175 mcg total) by mouth daily. 30 tablet 5    Dispense as written.    BP 174/90  Pulse 85  Temp 98.1 F (36.7 C)  Resp 20  SpO2 100%  LMP 05/05/1990  Physical Exam  Nursing note and vitals reviewed. Constitutional: She is oriented to person, place, and time. She appears well-developed and well-nourished. No distress.  HENT:  Head: Normocephalic and atraumatic.  Eyes: Conjunctivae are normal.  Neck: Neck supple.  Cardiovascular: Normal rate and regular rhythm.   Pulmonary/Chest: Effort normal. No respiratory distress.  Musculoskeletal: Normal range of motion.       Normal rom and exam of both left ankle and left knee. There is a small area about 3 cm in diameter to the left lateral shin that is erythmous, indurated, tender to  palpation. No fluctuance. No streaking. No calf tenderness. No swelling in left leg compared to right. Negative homans test  Neurological: She is alert and oriented to person, place, and time.  Skin: Skin is warm and dry.  Psychiatric: She has a normal mood and affect.    ED Course  Procedures (including critical care time)  Pt has no clinical signs or exam findings of DVT. She has no leg swelling, no calf tenderness, negative homans sign. No prior hx of blood clots. No recent surgeries, travel, exogenous estrogens. PT does have a small area that appears to be cellulitic to left leg. Will start on antibiotics with close follow up.  No diagnosis found.    MDM          Lottie Mussel, PA 06/21/11 1421

## 2011-06-21 NOTE — Discharge Instructions (Signed)
I do not see any signs of a blood clot in your leg. Start keflex as prescribed for possible infection. Keep a close eye on the leg, if have increased redness, swelling, streaks coming from the area, fever, return to the hospital otherwise follow up with primary care doctor in 2-3 days.   Cellulitis Cellulitis is an infection of the skin and the tissue beneath it. The area is typically red and tender. It is caused by germs (bacteria) (usually staph or strep) that enter the body through cuts or sores. Cellulitis most commonly occurs in the arms or lower legs.  HOME CARE INSTRUCTIONS   If you are given a prescription for medications which kill germs (antibiotics), take as directed until finished.   If the infection is on the arm or leg, keep the limb elevated as able.   Use a warm cloth several times per day to relieve pain and encourage healing.   See your caregiver for recheck of the infected site as directed if problems arise.   Only take over-the-counter or prescription medicines for pain, discomfort, or fever as directed by your caregiver.  SEEK MEDICAL CARE IF:   The area of redness (inflammation) is spreading, there are red streaks coming from the infected site, or if a part of the infection begins to turn dark in color.   The joint or bone underneath the infected skin becomes painful after the skin has healed.   The infection returns in the same or another area after it seems to have gone away.   A boil or bump swells up. This may be an abscess.   New, unexplained problems such as pain or fever develop.  SEEK IMMEDIATE MEDICAL CARE IF:   You have a fever.   You or your child feels drowsy or lethargic.   There is vomiting, diarrhea, or lasting discomfort or feeling ill (malaise) with muscle aches and pains.  MAKE SURE YOU:   Understand these instructions.   Will watch your condition.   Will get help right away if you are not doing well or get worse.  Document Released:  01/29/2005 Document Revised: 01/01/2011 Document Reviewed: 12/08/2007 [facility] Patient Information 2012 [place], [place].

## 2011-06-22 NOTE — ED Provider Notes (Signed)
Medical screening examination/treatment/procedure(s) were performed by non-physician practitioner and as supervising physician I was immediately available for consultation/collaboration. Jaevin Medearis Y.   Gavin Pound. Lela Gell, MD 06/22/11 1058

## 2011-07-25 ENCOUNTER — Ambulatory Visit: Payer: PRIVATE HEALTH INSURANCE | Admitting: Medical

## 2011-08-11 ENCOUNTER — Encounter: Payer: Self-pay | Admitting: Internal Medicine

## 2011-08-11 ENCOUNTER — Ambulatory Visit (INDEPENDENT_AMBULATORY_CARE_PROVIDER_SITE_OTHER): Payer: PRIVATE HEALTH INSURANCE | Admitting: Family Medicine

## 2011-08-11 ENCOUNTER — Encounter: Payer: Self-pay | Admitting: Family Medicine

## 2011-08-11 VITALS — BP 130/80 | HR 76 | Wt 250.0 lb

## 2011-08-11 DIAGNOSIS — M76899 Other specified enthesopathies of unspecified lower limb, excluding foot: Secondary | ICD-10-CM

## 2011-08-11 DIAGNOSIS — M7061 Trochanteric bursitis, right hip: Secondary | ICD-10-CM

## 2011-08-11 NOTE — Progress Notes (Signed)
  Subjective:    Patient ID: Katie Kaiser, female    DOB: 10-Nov-1945, 66 y.o.   MRN: [id number]  HPI Over the weekend while walking more than normal she experienced a sudden onset of right hip pain. No history of injury to that side. No other joints are involved. She had a mammogram approximately one year ago   Review of Systems     Objective:   Physical Exam Full motion of the hip without pain. Tenderness to palpation over the right greater trochanter.       Assessment & Plan:  Trochanteric bursitis. I discussed options with her including anti-inflammatories versus injection. She much would like an injection. 3 cc of Xylocaine and 40 mg of Kenalog was injected into the greater enteric bursa without difficulty. She obtained very quick relief of her symptoms.

## 2011-08-18 ENCOUNTER — Other Ambulatory Visit: Payer: Self-pay | Admitting: *Deleted

## 2011-08-18 DIAGNOSIS — E039 Hypothyroidism, unspecified: Secondary | ICD-10-CM

## 2011-08-18 MED ORDER — SYNTHROID 175 MCG PO TABS: 175.0000 ug | ORAL_TABLET | Freq: Every day | ORAL | Status: DC

## 2011-08-27 ENCOUNTER — Encounter (HOSPITAL_COMMUNITY): Payer: Self-pay | Admitting: General Practice

## 2011-08-27 ENCOUNTER — Emergency Department (HOSPITAL_COMMUNITY): Payer: PRIVATE HEALTH INSURANCE

## 2011-08-27 ENCOUNTER — Emergency Department (HOSPITAL_COMMUNITY)
Admission: EM | Admit: 2011-08-27 | Discharge: 2011-08-27 | Disposition: A | Discharge status: Home / Self Care | Payer: PRIVATE HEALTH INSURANCE | Attending: Emergency Medicine | Admitting: Emergency Medicine

## 2011-08-27 DIAGNOSIS — Z7982 Long term (current) use of aspirin: Secondary | ICD-10-CM | POA: Insufficient documentation

## 2011-08-27 DIAGNOSIS — E039 Hypothyroidism, unspecified: Secondary | ICD-10-CM | POA: Insufficient documentation

## 2011-08-27 DIAGNOSIS — S62639A Displaced fracture of distal phalanx of unspecified finger, initial encounter for closed fracture: Secondary | ICD-10-CM | POA: Insufficient documentation

## 2011-08-27 DIAGNOSIS — F329 Major depressive disorder, single episode, unspecified: Secondary | ICD-10-CM | POA: Insufficient documentation

## 2011-08-27 DIAGNOSIS — Z79899 Other long term (current) drug therapy: Secondary | ICD-10-CM | POA: Insufficient documentation

## 2011-08-27 DIAGNOSIS — M79609 Pain in unspecified limb: Secondary | ICD-10-CM | POA: Insufficient documentation

## 2011-08-27 DIAGNOSIS — F3289 Other specified depressive episodes: Secondary | ICD-10-CM | POA: Insufficient documentation

## 2011-08-27 DIAGNOSIS — M7989 Other specified soft tissue disorders: Secondary | ICD-10-CM | POA: Insufficient documentation

## 2011-08-27 DIAGNOSIS — W268XXA Contact with other sharp object(s), not elsewhere classified, initial encounter: Secondary | ICD-10-CM | POA: Insufficient documentation

## 2011-08-27 DIAGNOSIS — S61219A Laceration without foreign body of unspecified finger without damage to nail, initial encounter: Secondary | ICD-10-CM

## 2011-08-27 DIAGNOSIS — S61209A Unspecified open wound of unspecified finger without damage to nail, initial encounter: Secondary | ICD-10-CM | POA: Insufficient documentation

## 2011-08-27 MED ORDER — HYDROCODONE-ACETAMINOPHEN 5-325 MG PO TABS: 1.0000 | ORAL_TABLET | Freq: Four times a day (QID) | ORAL | Status: AC | PRN

## 2011-08-27 NOTE — Discharge Instructions (Signed)
Finger Avulsion  When the tip of the finger is lost, a new nail may grow back if part of the fingernail is left. The new nail may be deformed. If just the tip of the finger is lost, no repair may be needed unless there is bone showing. If bone is showing, your caregiver may need to remove the protruding bone and put on a bandage. Your caregiver will do what is best for you. Most of the time when a fingertip is lost, the end will gradually grow back on and look fairly normal, but it may remain sensitive to pressure and temperature extremes for a long time. HOME CARE INSTRUCTIONS   Keep your hand elevated above your heart to relieve pain and swelling.   Keep your dressing dry and clean.   Change your bandage in 24 hours or as directed.   After your bandage is changed, soak your hand in warm soapy water for 10 to 15 minutes. Do this 3 times per day. This helps reduce pain and swelling.   After soaking your hand, apply a clean, dry bandage. Change your bandage if it is wet or dirty.   Only take over-the-counter or prescription medicines for pain, discomfort, or fever as directed by your caregiver.   See your caregiver as needed for problems.  SEEK MEDICAL CARE IF:   You have increased pain, swelling, drainage, or bleeding.   You have a fever.   You have swelling that spreads from your finger and into your hand.  Make sure to check to see if you need a tetanus booster. Document Released: 06/30/2001 Document Revised: 04/10/2011 Document Reviewed: 05/25/2008 [facility] Patient Information 2012 [place], [place].Finger Fracture Fractures of fingers are breaks in the bones of the fingers. There are many types of fractures. There are different ways of treating these fractures, all of which can be correct. Your caregiver will discuss the best way to treat your fracture. TREATMENT  Finger fractures can be treated with:   Non-reduction - this means the bones are in place. The finger is splinted without  changing the positions of the bone pieces. The splint is usually left on for about a week to ten days. This will depend on your fracture and what your caregiver thinks.   Closed reduction - the bones are put back into position without using surgery. The finger is then splinted.   ORIF (open reduction and internal fixation) - the fracture site is opened. Then the bone pieces are fixed into place with pins or some type of hardware. This is seldom required. It depends on the severity of the fracture.  Your caregiver will discuss the type of fracture you have and the treatment that will be best for that problem. If surgery is the treatment of choice, the following is information for you to know and also let your caregiver know about prior to surgery. LET YOUR CAREGIVER KNOW ABOUT:  Allergies   Medications taken including herbs, eye drops, over the counter medications, and creams   Use of steroids (by mouth or creams)   Previous problems with anesthetics or Novocaine   Possibility of pregnancy, if this applies   History of blood clots (thrombophlebitis)   History of bleeding or blood problems   Previous surgery   Other health problems  AFTER THE PROCEDURE After surgery, you will be taken to the recovery area where a nurse will check your progress. Once you're awake, stable, and taking fluids well, barring other problems you will be allowed to  go home. Once home an ice pack applied to your operative site may help with discomfort and keep the swelling down. HOME CARE INSTRUCTIONS   Follow your caregiver's instructions as to activities, exercises, physical therapy, and driving a car.   Use your finger and exercise as directed.   Only take over-the-counter or prescription medicines for pain, discomfort, or fever as directed by your caregiver. Do not take aspirin until your caregiver OK's it, as this can increase bleeding immediately following surgery.   Stop using ibuprofen if it upsets  your stomach. Let your caregiver know about it.  SEEK MEDICAL CARE IF:  You have increased bleeding (more than a small spot) from the wound or from beneath your splint.   You develop redness, swelling, or increasing pain in the wound or from beneath your splint.   There is pus coming from the wound or from beneath your splint.   An unexplained oral temperature above 102 F (38.9 C) develops, or as your caregiver suggests.   There is a foul smell coming from the wound or dressing or from beneath your splint.  SEEK IMMEDIATE MEDICAL CARE IF:   You develop a rash.   You have difficulty breathing.   You have any allergic problems.  MAKE SURE YOU:   Understand these instructions.   Will watch your condition.   Will get help right away if you are not doing well or get worse.  Document Released: 08/03/2000 Document Revised: 04/10/2011 Document Reviewed: 12/09/2007 [facility] Patient Information 2012 [place], [place].Nail Avulsion Injury Nail avulsion means that you have lost the whole, or part of a nail. The nail will usually grow back in 2 to 6 months. If your injury damaged the growth center of the nail, the nail may be deformed, split, or not stuck to the nail bed. Sometimes the avulsed nail is stitched back in place. This provides temporary protection to the nail bed until the new nail grows in.  HOME CARE INSTRUCTIONS   Raise (elevate) your injury as much as possible.   Protect the injury and cover it with bandages (dressings) or splints as instructed.   Change dressings as instructed.  SEEK MEDICAL CARE IF:   There is increasing pain, redness, or swelling.   You cannot move your fingers or toes.  Document Released: 05/29/2004 Document Revised: 04/10/2011 Document Reviewed: 03/23/2009 [facility] Patient Information 2012 [place], [place].

## 2011-08-27 NOTE — ED Notes (Signed)
Pt was doing yard-work, cut finger at 1830 on motor of leaf blower. Laceration to top of left middle finger as well as on the fingernail, X-Ray needed on finger.

## 2011-08-27 NOTE — ED Provider Notes (Signed)
History     CSN: [id number]  Arrival date & time 08/27/11  1846   First MD Initiated Contact with Patient 08/27/11 1917     7:16  HPI Patient reports she was using a blower. Reports a loose piece on the leaf blower somehow cut her left middle finger. Reports significant bleeding and possible deformity. Unable to control bleeding. Denies taking blood thinners.   Patient is a 66 y.o. female presenting with hand pain. The history is provided by the patient.  Hand Pain This is a new problem. The current episode started today. The problem has been unchanged. Pertinent negatives include no chills, fever, numbness or weakness. Exacerbated by: Palpation. Treatments tried: Pressure. The treatment provided no relief.    Past Medical History  Diagnosis Date  . Hypothyroid   . Depression   . Impaired fasting glucose 5/07  . Obesity   . Vitamin d deficiency     Past Surgical History  Procedure Date  . Thyroidectomy 1975  . Tonsillectomy   . Appendectomy   . Cesarean section   . Orif forearm fracture 2002  . Colonoscopy 02/04/2011    diverticulosis    Family History  Problem Relation Age of Onset  . Parkinsonism Mother   . Diabetes Brother   . Heart disease Brother   . Cancer Neg Hx   . Colon cancer Neg Hx   . Stomach cancer Neg Hx   . HIV Brother     History  Substance Use Topics  . Smoking status: Former Smoker    Quit date: 05/05/1978  . Smokeless tobacco: Never Used  . Alcohol Use: Yes     occasionally, 2-3 times a week    OB History    Grav Para Term Preterm Abortions TAB SAB Ect Mult Living                  Review of Systems  Constitutional: Negative for fever and chills.  Skin: Positive for wound. Negative for color change.  Neurological: Negative for weakness and numbness.    Allergies  Review of patient's allergies indicates no known allergies.  Home Medications   Current Outpatient Rx  Name Route Sig Dispense Refill  . ASPIRIN 81 MG PO TABS Oral  Take 81 mg by mouth daily.      Marland Kitchen VITAMIN D 1000 UNITS PO CAPS Oral Take 1,000 Units by mouth daily.      . OMEGA-3 FATTY ACIDS 1000 MG PO CAPS Oral Take 2 g by mouth daily.      . IBUPROFEN 200 MG PO TABS Oral Take 600 mg by mouth every 8 (eight) hours as needed. For pain.    Marland Kitchen LEVOTHYROXINE SODIUM 200 MCG PO TABS Oral Take 200 mcg by mouth daily.    Marland Kitchen HYDROCODONE-ACETAMINOPHEN 5-325 MG PO TABS Oral Take 1-2 tablets by mouth every 6 (six) hours as needed for pain. 15 tablet 0    BP 173/91  Pulse 67  Temp(Src) 98.1 F (36.7 C) (Oral)  Resp 18  Ht 5\' 5"  (1.651 m)  Wt 250 lb (113.399 kg)  BMI 41.60 kg/m2  SpO2 98%  LMP 05/05/1990  Physical Exam  Vitals reviewed. Constitutional: She is oriented to person, place, and time. Vital signs are normal. She appears well-developed and well-nourished. No distress.  HENT:  Head: Normocephalic and atraumatic.  Eyes: Pupils are equal, round, and reactive to light.  Neck: Neck supple.  Pulmonary/Chest: Effort normal.  Musculoskeletal:       Decreased range  of motion left third distal phalanx. Likely due to pain and swelling patient does tendon function. Is able to flex and extend against resistance.  Neurological: She is alert and oriented to person, place, and time.  Skin: Skin is warm and dry. No rash noted. No erythema. No pallor.       Patient has partial avulsion of finger and fingernail of the left third digit. To palpation in persistently bleeding. No obvious laceration suture.  Psychiatric: She has a normal mood and affect. Her behavior is normal.    ED Course  Wound repair Date/Time: 08/27/2011 9:30 PM Performed by: Thomasene Lot Authorized by: Thomasene Lot Consent: Verbal consent obtained. Consent given by: patient Patient understanding: patient states understanding of the procedure being performed Imaging studies: imaging studies available Patient identity confirmed: verbally with patient Preparation: Patient was prepped  and draped in the usual sterile fashion. Local anesthesia used: yes Anesthesia: digital block Local anesthetic: lidocaine 1% without epinephrine Anesthetic total: 5 ml Patient sedated: no Comments: 2 4-0 Prolene sutures along bilateral nailbed patient has persistent bleeding from partial avulsion/ laceration of distal tip of finger. Also used wound seal powder and a pressure dressing to help stop bleeding. Patient had a finger splint placed  By ortho tech, jon     Labs Reviewed - No data to display Dg Finger Middle Left  08/27/2011  *RADIOLOGY REPORT*  Clinical Data: Finger laceration  LEFT MIDDLE FINGER 2+V  Comparison: None.  Findings: Mildly displaced fracture at the base of the distal phalanx of the left third digit. Bones are osteopenic, which may mask subtle fracture.  There is also a nondisplaced tuft fracture of the third digit.  If there is nail bed injury, this may predispose to osteomyelitis.  No radiopaque foreign body.  IMPRESSION: Tuft fracture and fracture at the base of the distal phalanx of the third digit.  Original Report Authenticated By: Harrel Lemon, M.D.     1. Laceration of finger   2. Fracture of distal phalanx of finger       MDM   Patient's finger reexamined after splint placement. Wound appears to be hemostatic. Advised changing the dressing every 24 hours and monitor for signs of infection. Advised she may return if wound continues to bleed. Patient agrees with plan and is ready for discharge      Thomasene Lot, Cordelia Poche 08/28/11 7829

## 2011-08-29 NOTE — ED Provider Notes (Signed)
Medical screening examination/treatment/procedure(s) were performed by non-physician practitioner and as supervising physician I was immediately available for consultation/collaboration.   Rolan Bucco, MD 08/29/11 [phone]

## 2011-09-04 ENCOUNTER — Other Ambulatory Visit: Payer: Self-pay

## 2011-09-04 ENCOUNTER — Encounter: Payer: Self-pay | Admitting: Family Medicine

## 2011-09-04 ENCOUNTER — Ambulatory Visit (INDEPENDENT_AMBULATORY_CARE_PROVIDER_SITE_OTHER): Payer: PRIVATE HEALTH INSURANCE | Admitting: Family Medicine

## 2011-09-04 VITALS — BP 128/80 | HR 80 | Temp 100.6°F

## 2011-09-04 DIAGNOSIS — R059 Cough, unspecified: Secondary | ICD-10-CM

## 2011-09-04 DIAGNOSIS — R05 Cough: Secondary | ICD-10-CM

## 2011-09-04 MED ORDER — ALBUTEROL SULFATE HFA 108 (90 BASE) MCG/ACT IN AERS: 2.0000 | INHALATION_SPRAY | Freq: Four times a day (QID) | RESPIRATORY_TRACT | Status: DC | PRN

## 2011-09-04 NOTE — Telephone Encounter (Signed)
Sent med to [facility]

## 2011-09-04 NOTE — Progress Notes (Signed)
  Subjective:    Patient ID: Katie Kaiser, female    DOB: 30-Dec-1945, 66 y.o.   MRN: [id number]  HPI She has a six-day history of started with cough and slight shortness of breath but no sore throat, fever, chills, headache, sneezing, itchy watery eyes. She did develop some rhinorrhea 2 days ago. She was seen recently in the emergency room for a finger fracture and does have sutures in that finger.   Review of Systems     Objective:   Physical Exam alert and in no distress. Tympanic membranes and canals are normal. Throat is clear. Tonsils are normal. Neck is supple without adenopathy or thyromegaly. Cardiac exam shows a regular sinus rhythm without murmurs or gallops. Lungs showed a few scattered rhonchi that do clear with a nebulizer treatment        Assessment & Plan:  Cough, etiology unclear. She has no good history for a bacterial or viral infection. I will get a chest x-ray and followup pending this. Albuterol inhaler given.

## 2011-09-05 ENCOUNTER — Other Ambulatory Visit: Payer: Self-pay

## 2011-09-05 ENCOUNTER — Ambulatory Visit
Admission: RE | Admit: 2011-09-05 | Discharge: 2011-09-05 | Disposition: A | Discharge status: Home / Self Care | Payer: PRIVATE HEALTH INSURANCE | Source: Ambulatory Visit | Attending: Family Medicine | Admitting: Family Medicine

## 2011-09-05 DIAGNOSIS — R05 Cough: Secondary | ICD-10-CM

## 2011-09-05 DIAGNOSIS — R059 Cough, unspecified: Secondary | ICD-10-CM

## 2011-09-05 MED ORDER — AZITHROMYCIN 500 MG PO TABS: 500.0000 mg | ORAL_TABLET | Freq: Every day | ORAL | Status: DC

## 2011-09-05 NOTE — Telephone Encounter (Signed)
Sent azithromycin 500 mg 1 QD #3 0 refill

## 2011-09-08 ENCOUNTER — Encounter: Payer: Self-pay | Admitting: Medical

## 2011-09-08 ENCOUNTER — Ambulatory Visit (INDEPENDENT_AMBULATORY_CARE_PROVIDER_SITE_OTHER): Payer: PRIVATE HEALTH INSURANCE | Admitting: Medical

## 2011-09-08 VITALS — BP 140/72 | HR 72 | Temp 98.3°F | Resp 16 | Wt 246.0 lb

## 2011-09-08 DIAGNOSIS — T148XXA Other injury of unspecified body region, initial encounter: Secondary | ICD-10-CM

## 2011-09-08 DIAGNOSIS — Z4802 Encounter for removal of sutures: Secondary | ICD-10-CM

## 2011-09-08 DIAGNOSIS — J189 Pneumonia, unspecified organism: Secondary | ICD-10-CM

## 2011-09-08 DIAGNOSIS — S62609A Fracture of unspecified phalanx of unspecified finger, initial encounter for closed fracture: Secondary | ICD-10-CM

## 2011-09-08 MED ORDER — LEVOFLOXACIN 500 MG PO TABS: 500.0000 mg | ORAL_TABLET | Freq: Every day | ORAL | Status: AC

## 2011-09-08 NOTE — Patient Instructions (Signed)
Begin Levaquin antibiotic daily for 7-10 days.  Rest, drink plenty of water, continue inhaler 3-4 times daily.   Keep finger clean with soap and water, use some salt water soaks with the finger, continue the splint for another 5 weeks.  The antibiotic is for both the finger and the pneumonia.   If worse or not improving in the next 3 days, call or return.  Otherwise recheck in 1 week.

## 2011-09-08 NOTE — Progress Notes (Signed)
Subjective: Here for recheck and suture removal.  Was seen here last week for pneumonia, was put on 3 day course of Azithromycin but worse now.  She notes worsening cough, more SOB ane wheezing, coughing up phlegm.  She was given inhaler and using this 3-4 times daily.  Denies hx/o COPD or asthma.  She also needs sutures removed from left third finger.   She got finger caught in a rotating part of leaf blower 12 days ago and part of end of her finger and nail was cut off.  She was seen in the ED, had 2 sutures placed, and here for suture removal.  She has been using finger splint daily.  No other aggravating or relieving factors.  No other c/o.  The following portions of the patient's history were reviewed and updated as appropriate: allergies, current medications, past family history, past medical history, past social history, past surgical history and problem list.   Objective:   Filed Vitals:   09/08/11 1122  BP: 140/72  Pulse: 72  Temp: 98.3 F (36.8 C)  Resp: 16    General appearance: Alert, WD/WN, no distress, ill appearing                             Skin: left 3rd distal phalanx with 2 interrupted blue Prelone bilaterally, nail and superivical portion of dorsal distal phalanx with obvious recent trauma but healing, scaling currently, distal phalanx with purplish blood blister, crusting along site of medial distal phalanx at site of recent avulsion, mild generalized tenderness, slight erythema generalized, but no warmth or induration, no fluctuance or purulent drainage.                           Head: no sinus tenderness                            Eyes: conjunctiva normal, corneas clear, PERRLA                            Ears: pearly TMs, external ear canals normal                          Nose: septum midline, turbinates swollen, with erythema and clear discharge             Mouth/throat: MMM, tongue normal, mild pharyngeal erythema                           Neck: supple, no  adenopathy, no thyromegaly, nontender                          Heart: RRR, normal S1, S2, no murmurs                         Lungs: +bronchial breath sounds, +scattered rhonchi, no wheezes, no rales MSK: finger ROM WNL Neuro: normal sensation of fingers                Extremities: no edema, nontender     Assessment and Plan:   Encounter Diagnoses  Name Primary?  . Pneumonia Yes  . Avulsion injury   . Finger fracture   . Visit for suture removal  Pneumonia - begin Levaquin, rest, hydrate well, c/t Albuterol.  Recheck 1wk, sooner prn if worse or not improving.  Left 3rd distal phalanx with some erythema today, but healing/improving.  Begin Levaquin, salt water warm finger soaks, finger elevation, call if worse.  Removed 2 simple interrupted sutures from left 3rd distal phalanx.  Pt tolerated procedure well.  C/t splint x 5 more weeks.   Recheck 1wk.

## 2011-09-12 ENCOUNTER — Encounter: Payer: Self-pay | Admitting: Medical

## 2011-09-12 ENCOUNTER — Ambulatory Visit (INDEPENDENT_AMBULATORY_CARE_PROVIDER_SITE_OTHER): Payer: PRIVATE HEALTH INSURANCE | Admitting: Medical

## 2011-09-12 VITALS — BP 130/80 | HR 84 | Temp 98.4°F | Resp 16

## 2011-09-12 DIAGNOSIS — J189 Pneumonia, unspecified organism: Secondary | ICD-10-CM

## 2011-09-12 DIAGNOSIS — S62609A Fracture of unspecified phalanx of unspecified finger, initial encounter for closed fracture: Secondary | ICD-10-CM

## 2011-09-12 DIAGNOSIS — L089 Local infection of the skin and subcutaneous tissue, unspecified: Secondary | ICD-10-CM

## 2011-09-12 NOTE — Progress Notes (Signed)
Subjective: Here for recheck on pneumonia and finger.  I saw her earlier in the week for ongoing cough and feeling ill with SOB from pneumonia.  Since earlier in the week feels much improvement, still has some cough though.   Breathing a lot better.  2 days after starting Levaquin noticed remarkable improvement.  Finger is looking better too, less redness, less swelling and pain.  No other c/o.  The following portions of the patient's history were reviewed and updated as appropriate: allergies, current medications, past family history, past medical history, past social history, past surgical history and problem list.   Objective:   Filed Vitals:   09/12/11 1604  BP: 130/80  Pulse: 84  Temp: 98.4 F (36.9 C)  Resp: 16    General appearance: Alert, WD/WN, no distress                             Skin: left 3rd distal phalanx with nail and superficial portion of dorsal distal phalanx with obvious recent trauma but healing, scaling currently, distal phalanx with purplish blood blister,less generalized tenderness, less erythema than earlier in the week, but no warmth or induration, no fluctuance or purulent drainage.                           Head: no sinus tenderness                            Eyes: conjunctiva normal, corneas clear, PERRLA                            Ears: pearly TMs, external ear canals normal                          Nose: septum midline, turbinates swollen, with erythema and clear discharge             Mouth/throat: MMM, tongue normal, mild pharyngeal erythema                           Neck: supple, no adenopathy, no thyromegaly, nontender                          Heart: RRR, normal S1, S2, no murmurs                         Lungs: few +scattered rhonchi, no wheezes, no rales MSK: finger ROM WNL Neuro: normal sensation of fingers             Assessment and Plan:   Encounter Diagnoses  Name Primary?  . Pneumonia Yes  . Finger fracture   . Finger infection     Pneumonia - improved significantly.  Finish Levaquin, rest, hydrate well, c/t Albuterol.  Call if worse or not improving.  Left 3rd distal phalanx improved. Finish Levaquin, c/t salt water soaks, rest, c/t splint x 5 more weeks.  Recheck in 5wk.

## 2011-09-26 ENCOUNTER — Other Ambulatory Visit: Payer: Self-pay | Admitting: Family Medicine

## 2011-09-26 ENCOUNTER — Telehealth: Payer: Self-pay | Admitting: Family Medicine

## 2011-09-26 DIAGNOSIS — F3289 Other specified depressive episodes: Secondary | ICD-10-CM

## 2011-09-26 DIAGNOSIS — F329 Major depressive disorder, single episode, unspecified: Secondary | ICD-10-CM

## 2011-09-26 MED ORDER — FLUOXETINE HCL 20 MG PO TABS: 20.0000 mg | ORAL_TABLET | Freq: Every day | ORAL | Status: DC

## 2011-09-26 NOTE — Telephone Encounter (Signed)
Is this okay?

## 2011-09-26 NOTE — Telephone Encounter (Signed)
Advise pt rx sent to pharmacy--needs to f/u if symptoms aren't improving in the next month (4-6 weeks)

## 2011-09-26 NOTE — Telephone Encounter (Signed)
I got this as a separate telephone message--med wasn't in system, so I redid it--I did it as a tablet rather than a capsule.  If pt has a preference, then go ahead and change to capsule (it is just how it came up in system when I typed in fluoxetine 20mg --they make both types, and she didn't specify in her message).  If she doesn't mind tablet, then deny this, as tablet was already sent to pharmacy

## 2011-09-26 NOTE — Telephone Encounter (Signed)
Pt states that she was not taking this as much but due to recent stress she want to continue and needs refill

## 2011-09-26 NOTE — Telephone Encounter (Signed)
Addended by: Barbette Or A on: 09/26/2011 03:53 PM   Modules accepted: Orders

## 2011-09-26 NOTE — Telephone Encounter (Signed)
Pt notified that she would only be at 20mg  and she could come in 2 weeks and discuss going up to 40mg 

## 2011-09-26 NOTE — Telephone Encounter (Signed)
PT INFORMED

## 2011-09-26 NOTE — Telephone Encounter (Signed)
Cancelled the tablet and sent in the capsule

## 2011-09-26 NOTE — Telephone Encounter (Signed)
She really should make OV to discuss what is going on.  It looks like her last rx was for 20mg , not 40mg .  You don't usually start all at once at the higher dose (will have increased side effects and increased anxiety).  Start at 20mg .  Okay to send in for capsules, but then call and cancel the tablets that was already sent in through phone message.   Schedule appt for 2 weeks, at which time we can consider increasing the dose to 40 mg if needed

## 2011-09-26 NOTE — Telephone Encounter (Signed)
Pt would like to stay on the capsule and go back to 40mg  just for a month if she can then will go back down to 20mg . Pt states she is going through a lot with her Son getting a divorce and is heartbroken and she just would like to go back up to 40mg 

## 2011-11-03 ENCOUNTER — Encounter: Payer: Self-pay | Admitting: Family Medicine

## 2011-11-03 ENCOUNTER — Ambulatory Visit (INDEPENDENT_AMBULATORY_CARE_PROVIDER_SITE_OTHER): Payer: PRIVATE HEALTH INSURANCE | Admitting: Family Medicine

## 2011-11-03 VITALS — BP 136/72 | HR 76 | Ht 65.0 in | Wt 247.0 lb

## 2011-11-03 DIAGNOSIS — F329 Major depressive disorder, single episode, unspecified: Secondary | ICD-10-CM

## 2011-11-03 DIAGNOSIS — F3289 Other specified depressive episodes: Secondary | ICD-10-CM | POA: Insufficient documentation

## 2011-11-03 MED ORDER — FLUOXETINE HCL 20 MG PO CAPS: 20.0000 mg | ORAL_CAPSULE | Freq: Every day | ORAL | Status: DC

## 2011-11-03 MED ORDER — BUPROPION HCL ER (XL) 150 MG PO TB24: ORAL_TABLET | ORAL | Status: DC

## 2011-11-03 NOTE — Patient Instructions (Addendum)
Continue the prozac at 20mg . Start wellbutrin 150 mg in the mornings. Take 1 tablet for a week, then, if not having side effects then increase to 2 tablets in the morning.  Consider counseling  If and when you do well, we can consider tapering down the prozac further, to see if you need to continue it longterm.  For now, stay on both meds together.

## 2011-11-03 NOTE — Progress Notes (Signed)
Chief Complaint  Patient presents with  . Medication Dose Change    patient interested in switching from Prozac to Zoloft, took Zoloft in the past(long time ago) she thinks it worked well and was changed due to doctor preference.   HPI:  Has been on prozac for years for depression.  It seemed to work fine for a while, but hasn't been helping much for the last 6-8 months.  Feeling sad, cries easily, hard to fall asleep.  Feels more sadness.  Some stress (son and his wife are having marital problems), but think she's dealing with it.  Has been feeling this way since Christmas.  Went down from 40mg  to 20mg  of prozac several months ago, as she didn't seem to think it was effective, and so she figured she would wean herself off of it, hoping to start something else instead.  Also recalls having been on Cymbalta, and felt it was ineffective.  Started going back to the gym yesterday, and starting to get in the sun a little for some Vitamin D.  Saw endocrinologist in [place] (Dr. Cherlyn Roberts) 3 months ago, and thyroid dose was increased to 0.2, and diagnosed with Vitamin D deficiency.  She was treated with Rx Vitamin D.  She reports feeling better.  She also had an ultrasound of her thyroid. She just had her TSH rechecked 2 weeks ago and level was perfect. Has another appointment in September.  PHYSICAL EXAM: BP 136/72  Pulse 76  Ht 5\' 5"  (1.651 m)  Wt 247 lb (112.038 kg)  BMI 41.10 kg/m2  LMP 05/05/1990 Pleasant, well-appearing obese female in no distress Psych: normal mood, affect, hygiene and grooming with full range of affect, despite her intermittently reporting that she feels sad. Exam was limited to counseling and discussion.  ASSESSMENT/PLAN: 1. Depressive disorder, not elsewhere classified  buPROPion (WELLBUTRIN XL) 150 MG 24 hr tablet, FLUoxetine (PROZAC) 20 MG capsule   Add wellbutrin. Continue prozac at 20mg  150 mg x 1 week, then increase to 300mg . At some point, if doing very well at f/u, can  consider coming off the prozac to see if wellbutrin can treat the depression alone

## 2011-12-02 ENCOUNTER — Other Ambulatory Visit: Payer: Self-pay | Admitting: Family Medicine

## 2011-12-02 MED ORDER — BUPROPION HCL ER (XL) 300 MG PO TB24: 300.0000 mg | ORAL_TABLET | Freq: Every day | ORAL | Status: DC

## 2011-12-02 NOTE — Telephone Encounter (Signed)
She was due to f/u, and it appears that she doesn't have an appointment scheduled.  Please schedule appt for f/u depression.  Do NOT refill Wellbutrin XL 150mg --instead, please change to Wellbutrin XL 300mg , 1 tab daily, #30 with no refills.  Thanks

## 2011-12-02 NOTE — Telephone Encounter (Signed)
Is this ok?

## 2011-12-02 NOTE — Telephone Encounter (Signed)
Sent med in and pt has appt also changed med per Dr.Knapp

## 2011-12-11 ENCOUNTER — Ambulatory Visit (INDEPENDENT_AMBULATORY_CARE_PROVIDER_SITE_OTHER): Payer: PRIVATE HEALTH INSURANCE | Admitting: Family Medicine

## 2011-12-11 ENCOUNTER — Encounter: Payer: Self-pay | Admitting: Family Medicine

## 2011-12-11 VITALS — BP 118/72 | HR 68 | Ht 65.0 in | Wt 232.0 lb

## 2011-12-11 DIAGNOSIS — G4700 Insomnia, unspecified: Secondary | ICD-10-CM

## 2011-12-11 DIAGNOSIS — F3289 Other specified depressive episodes: Secondary | ICD-10-CM

## 2011-12-11 DIAGNOSIS — F329 Major depressive disorder, single episode, unspecified: Secondary | ICD-10-CM

## 2011-12-11 MED ORDER — TRAZODONE HCL 50 MG PO TABS: 50.0000 mg | ORAL_TABLET | Freq: Every evening | ORAL | Status: DC | PRN

## 2011-12-11 NOTE — Patient Instructions (Signed)
Start trazadone at bedtime to help you sleep.  If one tablet isn't effective, then try two.   If this isn't effective, or if you can't tolerate the medication, then call to change to [place]. If it is needed longterm, we may need to change from the Ambien to the long-acting version which is safer to use longterm.

## 2011-12-11 NOTE — Progress Notes (Signed)
Chief Complaint  Patient presents with  . Follow-up    depression follow up.   HPI:  Patient presents for follow up on depression.  Last month Wellbutrin was added to her 20mg  of Prozac (she had self decreased dose).  She thinks the Wellbutrin helped decrease her appetite--she is eating healthier, less, and has lost 15 pounds and is feeling great.  Doing better overall as far as moods, but still is not sleeping well. She isn't crying as often.  She is still upset about her situation--her son moved out, leaving the wife pregnant, and with an 16 month old.  She is angry with her son's choice. She is having some trouble falling asleep.  She tried melatonin and benadryl without improvement in her sleep. She denies side effects of the Wellbutrin (trouble sleeping preceded being on med.  Past Medical History  Diagnosis Date  . Hypothyroid   . Depression   . Impaired fasting glucose 5/07  . Obesity   . Vitamin d deficiency    History   Social History  . Marital Status: Divorced    Spouse Name: N/A    Number of Children: N/A  . Years of Education: N/A   Occupational History  . Not on file.   Social History Main Topics  . Smoking status: Former Smoker    Quit date: 05/05/1978  . Smokeless tobacco: Never Used  . Alcohol Use: Yes     occasionally, 2-3 times a week  . Drug Use: No  . Sexually Active: Not Currently   Other Topics Concern  . Not on file   Social History Narrative  . No narrative on file   Current Outpatient Prescriptions on File Prior to Visit  Medication Sig Dispense Refill  . aspirin 81 MG tablet Take 81 mg by mouth daily.        Marland Kitchen buPROPion (WELLBUTRIN XL) 300 MG 24 hr tablet Take 1 tablet (300 mg total) by mouth daily.  30 tablet  0  . Cholecalciferol (VITAMIN D) 1000 UNITS capsule Take 1,000 Units by mouth daily.        . fish oil-omega-3 fatty acids 1000 MG capsule Take 2 g by mouth daily.        Marland Kitchen FLUoxetine (PROZAC) 20 MG capsule Take 1 capsule (20 mg total)  by mouth daily.  90 capsule  1  . ibuprofen (ADVIL,MOTRIN) 200 MG tablet Take 600 mg by mouth every 8 (eight) hours as needed. For pain.      Marland Kitchen levothyroxine (SYNTHROID, LEVOTHROID) 200 MCG tablet Take 200 mcg by mouth daily. Name brand only      . albuterol (PROVENTIL HFA;VENTOLIN HFA) 108 (90 BASE) MCG/ACT inhaler Inhale 2 puffs into the lungs every 6 (six) hours as needed for wheezing.  1 Inhaler  0  . traZODone (DESYREL) 50 MG tablet Take 1-2 tablets (50-100 mg total) by mouth at bedtime as needed for sleep.  60 tablet  1  (Trazadone added today, NOT taking prior to visit)  No Known Allergies ROS:  Denies headaches, fevers, URI symptoms, chest pain, shortness of breath. Knee pain is improved.  No GI complaints.  See HPI  PHYSICAL EXAM: BP 118/72  Pulse 68  Ht 5\' 5"  (1.651 m)  Wt 232 lb (105.235 kg)  BMI 38.61 kg/m2  LMP 05/05/1990 Well developed, pleasant female, 15 pounds lighter, smiling, in no distress Psych: normal mood, affect, hygiene and grooming  ASSESSMENT/PLAN: 1. Insomnia  traZODone (DESYREL) 50 MG tablet  2. Depressive disorder,  not elsewhere classified  traZODone (DESYREL) 50 MG tablet   Depression improved--continue on both Wellbutrin and Prozac. Insomnia--Given that her insomnia appears to be more chronic rather than sporadic, will try trazadone 50 mg nightly, may increase to 100mg  if needed.  If ineffective, consider ambien, but if needs it every night, may need to change to the CR. She will call and let us know if Trazadone is ineffective or not tolerating.   Risks/side effects of meds reviewed.  Hypothyroidism--TSH last checked the end of November.  Will need to recheck within the next few months, especially if has ongoing weight loss (not as cause necessarily, but dose may need to be adjusted if continues to lose weight).

## 2011-12-17 ENCOUNTER — Telehealth: Payer: Self-pay | Admitting: Family Medicine

## 2011-12-17 MED ORDER — ZOLPIDEM TARTRATE 5 MG PO TABS: 5.0000 mg | ORAL_TABLET | Freq: Every evening | ORAL | Status: DC | PRN

## 2011-12-17 NOTE — Telephone Encounter (Signed)
Called pt and informed her of med change and appt needed in 1 mth pt verbalize understanding called med in per [place]

## 2011-12-17 NOTE — Telephone Encounter (Signed)
Let her know that it could be the medication. As per Dr. Delford Field note switch her to Ambien 5 mg,#30, 1 each bedtime and have her followup with me or Dr. Lynelle Doctor in about a month

## 2011-12-17 NOTE — Telephone Encounter (Signed)
Pt saw Dr. Lynelle Doctor last week and was placed on Trazodone to assist her with sleeping. Since taking the new med she has had moments of feeling faint and lightheaded and just a general feeling weirdness. Pt wants to know if it is the medication, please call pt

## 2011-12-19 ENCOUNTER — Telehealth: Payer: Self-pay | Admitting: Family Medicine

## 2011-12-24 NOTE — Telephone Encounter (Signed)
LM

## 2012-01-06 ENCOUNTER — Other Ambulatory Visit: Payer: Self-pay | Admitting: Family Medicine

## 2012-01-06 DIAGNOSIS — F3289 Other specified depressive episodes: Secondary | ICD-10-CM

## 2012-01-06 DIAGNOSIS — F329 Major depressive disorder, single episode, unspecified: Secondary | ICD-10-CM

## 2012-01-06 NOTE — Telephone Encounter (Signed)
done

## 2012-01-06 NOTE — Telephone Encounter (Signed)
Is this ok?

## 2012-01-16 ENCOUNTER — Other Ambulatory Visit: Payer: Self-pay | Admitting: Family Medicine

## 2012-01-16 ENCOUNTER — Telehealth: Payer: Self-pay | Admitting: Family Medicine

## 2012-01-16 NOTE — Telephone Encounter (Signed)
PHARMACIST SAID THAT PT DIDN'T HAVE A REFILL AND SOMEONE ELSE HAD CALLED IT IN ?

## 2012-01-16 NOTE — Telephone Encounter (Signed)
Is this ok?

## 2012-01-16 NOTE — Telephone Encounter (Signed)
It was called in for #30 with 1 refill last month. Did she get the refill?  If she has used #60, then deny and schedule f/u. If she didn't, then okay for the refill (#30 only).

## 2012-01-16 NOTE — Telephone Encounter (Signed)
DONE

## 2012-01-16 NOTE — Addendum Note (Signed)
Addended byJoselyn Arrow on: 01/16/2012 04:38 PM   Modules accepted: Orders

## 2012-01-16 NOTE — Telephone Encounter (Signed)
According to computer, it was you Dennard Nip) who called in #30 per Dr. Jola Babinski approval--per computer, it looks like it was done as #30 with 1 refill.  If she only got #30 mid August, then okay for another #30.  If she got rx'd by another MD, I'd like to know from which doctor (last month should have been from our office)

## 2012-02-13 ENCOUNTER — Telehealth: Payer: Self-pay | Admitting: Family Medicine

## 2012-02-13 MED ORDER — ZOLPIDEM TARTRATE 5 MG PO TABS: 5.0000 mg | ORAL_TABLET | Freq: Every evening | ORAL | Status: DC | PRN

## 2012-02-13 NOTE — Telephone Encounter (Signed)
Renew this with one refill and reinforced the need to use this sparingly

## 2012-02-16 ENCOUNTER — Encounter: Payer: Self-pay | Admitting: Family Medicine

## 2012-02-16 ENCOUNTER — Ambulatory Visit (INDEPENDENT_AMBULATORY_CARE_PROVIDER_SITE_OTHER): Payer: Self-pay | Admitting: Family Medicine

## 2012-02-16 VITALS — BP 126/70 | HR 72 | Temp 97.9°F | Ht 65.0 in | Wt 221.0 lb

## 2012-02-16 DIAGNOSIS — N390 Urinary tract infection, site not specified: Secondary | ICD-10-CM

## 2012-02-16 DIAGNOSIS — R109 Unspecified abdominal pain: Secondary | ICD-10-CM

## 2012-02-16 DIAGNOSIS — R1032 Left lower quadrant pain: Secondary | ICD-10-CM

## 2012-02-16 LAB — POCT URINALYSIS DIPSTICK
Ketones, UA: NEGATIVE
Urobilinogen, UA: NEGATIVE
pH, UA: 5

## 2012-02-16 MED ORDER — CIPROFLOXACIN HCL 500 MG PO TABS: 500.0000 mg | ORAL_TABLET | Freq: Two times a day (BID) | ORAL | Status: DC

## 2012-02-16 MED ORDER — METRONIDAZOLE 500 MG PO TABS: 500.0000 mg | ORAL_TABLET | Freq: Four times a day (QID) | ORAL | Status: DC

## 2012-02-16 NOTE — Progress Notes (Signed)
Chief Complaint  Patient presents with  . Flank Pain    left side pain, lower abdominal pain and pain over her kidney area x 10 days.   HPI: Starting feeling bad just over a week ago--thought she hurt her back.  Saw chiropractor, did x-ray, and told she needed adjustment.  Then she had pain low in her L abdomen, thought it was diverticulitis, but then pain also radiated to her lower back.  Denies nausea, vomiting or fevers.  A few weeks ago she was bothered with constipation, but has resolved, and bowels are now normal.  Denies any blood or mucus in the stools.  Has been using ibuprofen and using an ice pack for the back. Having some urinary urgency and frequency, small amounts.  Denies dysuria. Denies vaginal discharge or bleeding.  Denies any pain radiating into the leg.  Denies numbness/tingling/weakness of the left leg.  Recently had TFT's done by endo in [place], and TSH 0.16--has appt with him in 2 weeks.  "Wellbutrin is a godsend"--no longer uses food to feel better, and has helped her lose weight.  She has lost 26 pounds in the last 3.5 months.  Past Medical History  Diagnosis Date  . Hypothyroid   . Depression   . Impaired fasting glucose 5/07  . Obesity   . Vitamin D deficiency    Past Surgical History  Procedure Date  . Thyroidectomy 1975  . Tonsillectomy   . Appendectomy   . Cesarean section   . Orif forearm fracture 2002  . Colonoscopy 02/04/2011    diverticulosis   History   Social History  . Marital Status: Divorced    Spouse Name: N/A    Number of Children: N/A  . Years of Education: N/A   Occupational History  . Not on file.   Social History Main Topics  . Smoking status: Former Smoker    Quit date: 05/05/1978  . Smokeless tobacco: Never Used  . Alcohol Use: Yes     occasionally, 2-3 times a week  . Drug Use: No  . Sexually Active: Not Currently   Other Topics Concern  . Not on file   Social History Narrative  . No narrative on file   Current  outpatient prescriptions:aspirin 81 MG tablet, Take 81 mg by mouth daily.  , Disp: , Rfl: ;  buPROPion (WELLBUTRIN XL) 300 MG 24 hr tablet, TAKE 1 TABLET (300 MG TOTAL) BY MOUTH DAILY., Disp: 30 tablet, Rfl: 11;  Cholecalciferol (VITAMIN D) 1000 UNITS capsule, Take 1,000 Units by mouth daily.  , Disp: , Rfl: ;  fish oil-omega-3 fatty acids 1000 MG capsule, Take 2 g by mouth daily.  , Disp: , Rfl:  FLUoxetine (PROZAC) 20 MG capsule, Take 1 capsule (20 mg total) by mouth daily., Disp: 90 capsule, Rfl: 1;  ibuprofen (ADVIL,MOTRIN) 200 MG tablet, Take 600 mg by mouth every 8 (eight) hours as needed. For pain., Disp: , Rfl: ;  levothyroxine (SYNTHROID, LEVOTHROID) 200 MCG tablet, Take 200 mcg by mouth daily. Name brand only, Disp: , Rfl:  zolpidem (AMBIEN) 5 MG tablet, Take 1 tablet (5 mg total) by mouth at bedtime as needed for sleep., Disp: 30 tablet, Rfl: 0;  albuterol (PROVENTIL HFA;VENTOLIN HFA) 108 (90 BASE) MCG/ACT inhaler, Inhale 2 puffs into the lungs every 6 (six) hours as needed for wheezing., Disp: 1 Inhaler, Rfl: 0  No Known Allergies  ROS:  Denies fevers, vomiting.  See HPI re: bowel changes.  Denies vaginal discharge.  +urinary complaints.  Denies URI symptoms, chest pain, shortness of breath, skin rash, edema. + weight loss.  Denies hair/skin changes.  Moods are good.  PHYSICAL EXAM: BP 126/70  Pulse 72  Temp 97.9 F (36.6 C) (Oral)  Ht 5\' 5"  (1.651 m)  Wt 221 lb (100.245 kg)  BMI 36.78 kg/m2  LMP 05/05/1990  Well developed, well-appearing female in no distress Neck: no lymphadenopathy or thyromegaly or mass Heart: regular rate and rhythm Lungs: clear bilaterally Back: no spine or CVA tenderness.  Only very mild tenderness at L SI joint.  No muscle spasm Abdomen: soft,  Normal bowel sounds.  No organomegaly or mass.  Tender at LLQ.  No mass or rebound tenderness or guarding.  No hernia.  No inguinal adenopathy.  No pain with weight-bearing or range of motion Neuro: alert and  oriented.  DTR's 2+ and symmetric.  Normal strength and sensation.  Negative SLR.   Urine dip:  2+ leuks, 1+ protein, 1+ blood.  ASSESSMENT/PLAN: 1. Flank pain  POCT Urinalysis Dipstick  2. LLQ abdominal pain  metroNIDAZOLE (FLAGYL) 500 MG tablet, ciprofloxacin (CIPRO) 500 MG tablet  3. UTI (lower urinary tract infection)  ciprofloxacin (CIPRO) 500 MG tablet    LLQ abdominal pain, with abnormal urine dip.  Exam seems more consistent with diverticulitis (mild, pt has had in past, feels similar), but given abnormal urine dip, and lack of insurance, will not send for culture, but just treat with cipro and flagyl, which would cover both.  Return next week if symptoms persist or worsen.  May ultimately need to send urine culture if urinary symptoms (and abnormal urine dip) persist. May need CBC and/or scan if symptoms not improving.  hypothyroidsim--discussed that likely her med dose would be decreased at her f/u with her endo in 2 weeks.

## 2012-02-16 NOTE — Patient Instructions (Addendum)
We are treating you presumptively for diverticulitis and UTI (urinary infection).  Take the medications as directed for 10 days.  Return if symptoms persist or worsen, especially if you develop fever, vomiting (where you can't keep down the medicine).  If symptoms persist/worsen, you may need additional evaluation/testing done (bloodwork, possible scan).  Avoid alcohol with the metronidazole.  Keep up the good work losing weight!!  You are going to likely have your thyroid dose adjusted by your endocrinologist--looks like the dose is a little too high for you.

## 2012-02-19 ENCOUNTER — Telehealth: Payer: Self-pay | Admitting: Family Medicine

## 2012-02-19 DIAGNOSIS — R110 Nausea: Secondary | ICD-10-CM

## 2012-02-19 MED ORDER — ONDANSETRON 8 MG PO TBDP: 8.0000 mg | ORAL_TABLET | Freq: Three times a day (TID) | ORAL | Status: DC | PRN

## 2012-02-19 NOTE — Telephone Encounter (Signed)
Flagyl may contribute to nausea--can try taking with or without food to see if makes a difference.  Okay to rx zofran or phenergan if needed for nausea, but continue with ABX

## 2012-02-19 NOTE — Telephone Encounter (Signed)
Spoke with patient, called in Zofran 8mg  ODT and she will continue on her Flagyl as rx'd.

## 2012-02-20 ENCOUNTER — Telehealth: Payer: Self-pay | Admitting: Family Medicine

## 2012-02-20 ENCOUNTER — Encounter: Payer: Self-pay | Admitting: Family Medicine

## 2012-02-20 MED ORDER — ONDANSETRON HCL 4 MG PO TABS: ORAL_TABLET | ORAL | Status: DC

## 2012-02-20 NOTE — Telephone Encounter (Signed)
The Zofran that was called in does have potential interaction (QT prolongation) with some of her other meds, including the Cipro, but isn't contraindicated, just to use with caution.  She isn't going to be using it longterm, just prn, short-term, and should be okay--I recommend using the lower dose though--change to 4mg  tablet (ODT) instead, and use sparingly, only if very nauseated (not every 8hrs).  The only other option is phenergan, which may make her drowsy and affect her job.

## 2012-02-20 NOTE — Telephone Encounter (Signed)
Pt called and stated that when she went to pick up medication that was sent in for her yesterday, the pharmacy would not fill. Per pt the pharmacy stated that the mediation that was called in had a drug interaction with one of her other meds. Pt states she is still having nausea issues and would like something else. [facility] [address].

## 2012-02-20 NOTE — Telephone Encounter (Signed)
Sent in zofran 4mg  to [facility]

## 2012-02-20 NOTE — Telephone Encounter (Signed)
Pt informed given to sabrina to document

## 2012-02-22 ENCOUNTER — Emergency Department (HOSPITAL_COMMUNITY): Payer: Self-pay

## 2012-02-22 ENCOUNTER — Emergency Department (HOSPITAL_COMMUNITY)
Admission: EM | Admit: 2012-02-22 | Discharge: 2012-02-22 | Disposition: A | Discharge status: Home / Self Care | Payer: Self-pay | Attending: Emergency Medicine | Admitting: Emergency Medicine

## 2012-02-22 ENCOUNTER — Encounter (HOSPITAL_COMMUNITY): Payer: Self-pay | Admitting: *Deleted

## 2012-02-22 DIAGNOSIS — R1032 Left lower quadrant pain: Secondary | ICD-10-CM | POA: Insufficient documentation

## 2012-02-22 DIAGNOSIS — Z79899 Other long term (current) drug therapy: Secondary | ICD-10-CM | POA: Insufficient documentation

## 2012-02-22 DIAGNOSIS — N390 Urinary tract infection, site not specified: Secondary | ICD-10-CM | POA: Insufficient documentation

## 2012-02-22 DIAGNOSIS — Z9089 Acquired absence of other organs: Secondary | ICD-10-CM | POA: Insufficient documentation

## 2012-02-22 DIAGNOSIS — Z7982 Long term (current) use of aspirin: Secondary | ICD-10-CM | POA: Insufficient documentation

## 2012-02-22 DIAGNOSIS — E039 Hypothyroidism, unspecified: Secondary | ICD-10-CM | POA: Insufficient documentation

## 2012-02-22 LAB — COMPREHENSIVE METABOLIC PANEL
Albumin: 3.6 g/dL (ref 3.5–5.2)
BUN: 16 mg/dL (ref 6–23)
Calcium: 8.9 mg/dL (ref 8.4–10.5)
Chloride: 103 mEq/L (ref 96–112)
Creatinine, Ser: 0.84 mg/dL (ref 0.50–1.10)
Total Bilirubin: 0.2 mg/dL — ABNORMAL LOW (ref 0.3–1.2)
Total Protein: 6.8 g/dL (ref 6.0–8.3)

## 2012-02-22 LAB — CBC WITH DIFFERENTIAL/PLATELET
Basophils Relative: 0 % (ref 0–1)
Eosinophils Absolute: 0.2 10*3/uL (ref 0.0–0.7)
Eosinophils Relative: 3 % (ref 0–5)
HCT: 34.8 % — ABNORMAL LOW (ref 36.0–46.0)
Hemoglobin: 11.8 g/dL — ABNORMAL LOW (ref 12.0–15.0)
MCH: 27.4 pg (ref 26.0–34.0)
MCHC: 33.9 g/dL (ref 30.0–36.0)
MCV: 80.7 fL (ref 78.0–100.0)
Monocytes Absolute: 0.5 10*3/uL (ref 0.1–1.0)
Monocytes Relative: 6 % (ref 3–12)

## 2012-02-22 LAB — URINALYSIS, ROUTINE W REFLEX MICROSCOPIC
Glucose, UA: NEGATIVE mg/dL
Protein, ur: NEGATIVE mg/dL

## 2012-02-22 LAB — URINE MICROSCOPIC-ADD ON

## 2012-02-22 MED ORDER — IOHEXOL 300 MG/ML  SOLN
100.0000 mL | Freq: Once | INTRAMUSCULAR | Status: AC | PRN
  Administered 2012-02-22: 100 mL via INTRAVENOUS

## 2012-02-22 MED ORDER — DEXTROSE 5 % IV SOLN
1.0000 g | Freq: Once | INTRAVENOUS | Status: AC
  Administered 2012-02-22: 1 g via INTRAVENOUS
  Filled 2012-02-22: qty 10

## 2012-02-22 MED ORDER — ONDANSETRON HCL 4 MG/2ML IJ SOLN
4.0000 mg | Freq: Once | INTRAMUSCULAR | Status: AC
  Administered 2012-02-22: 4 mg via INTRAVENOUS
  Filled 2012-02-22: qty 2

## 2012-02-22 MED ORDER — SULFAMETHOXAZOLE-TRIMETHOPRIM 800-160 MG PO TABS: 1.0000 | ORAL_TABLET | Freq: Two times a day (BID) | ORAL | Status: DC

## 2012-02-22 MED ORDER — TRAMADOL HCL 50 MG PO TABS: 50.0000 mg | ORAL_TABLET | Freq: Four times a day (QID) | ORAL | Status: DC | PRN

## 2012-02-22 MED ORDER — MORPHINE SULFATE 4 MG/ML IJ SOLN
4.0000 mg | Freq: Once | INTRAMUSCULAR | Status: AC
  Administered 2012-02-22: 4 mg via INTRAVENOUS
  Filled 2012-02-22: qty 1

## 2012-02-22 NOTE — ED Notes (Addendum)
Pt reports lower left abdominal pain x2 weeks. Pt went to pcp on Monday, pcp prescribed medications cipro, flagyl, and zofran. pt has hx of diverticulitis. Last night the pain returned and today has gotten progressively worse, at present 9/10.  Last bowel movement today.

## 2012-02-22 NOTE — ED Provider Notes (Signed)
History     CSN: [id number]  Arrival date & time 02/22/12  1638   First MD Initiated Contact with Patient 02/22/12 1719      Chief Complaint  Patient presents with  . Abdominal Pain    (Consider location/radiation/quality/duration/timing/severity/associated sxs/prior treatment) HPI Pt with 2 weeks of LLQ pain. Seen by PMD on Monday and started on antibiotics for possible diverticulitis. Pt is not improved. Pt describes pain as sharp and episodic. No N/V/D/C. No GI bleeding. No fever or chills. No urinary symptoms Past Medical History  Diagnosis Date  . Hypothyroid   . Depression   . Impaired fasting glucose 5/07  . Obesity   . Vitamin D deficiency     Past Surgical History  Procedure Date  . Thyroidectomy 1975  . Tonsillectomy   . Appendectomy   . Cesarean section   . Orif forearm fracture 2002  . Colonoscopy 02/04/2011    diverticulosis    Family History  Problem Relation Age of Onset  . Parkinsonism Mother   . Diabetes Brother   . Heart disease Brother   . Cancer Neg Hx   . Colon cancer Neg Hx   . Stomach cancer Neg Hx   . HIV Brother     History  Substance Use Topics  . Smoking status: Former Smoker    Quit date: 05/05/1978  . Smokeless tobacco: Never Used  . Alcohol Use: Yes     occasionally, 2-3 times a week    OB History    Grav Para Term Preterm Abortions TAB SAB Ect Mult Living                  Review of Systems  Constitutional: Negative for fever and chills.  Respiratory: Negative for shortness of breath.   Cardiovascular: Negative for chest pain.  Gastrointestinal: Positive for abdominal pain. Negative for nausea, vomiting, diarrhea, constipation, blood in stool and abdominal distention.  Genitourinary: Positive for flank pain. Negative for dysuria, frequency and hematuria.  Musculoskeletal: Negative for back pain.  Skin: Negative for pallor, rash and wound.  Neurological: Negative for dizziness, weakness, light-headedness, numbness and  headaches.    Allergies  Review of patient's allergies indicates no known allergies.  Home Medications   Current Outpatient Rx  Name Route Sig Dispense Refill  . ASPIRIN 81 MG PO TABS Oral Take 81 mg by mouth daily.      . BUPROPION HCL ER (XL) 300 MG PO TB24 Oral Take 300 mg by mouth daily.    Marland Kitchen VITAMIN D 1000 UNITS PO CAPS Oral Take 1,000 Units by mouth daily.      . OMEGA-3 FATTY ACIDS 1000 MG PO CAPS Oral Take 2 g by mouth daily.      Marland Kitchen FLUOXETINE HCL 20 MG PO CAPS Oral Take 1 capsule (20 mg total) by mouth daily. 90 capsule 1  . IBUPROFEN 200 MG PO TABS Oral Take 600 mg by mouth every 8 (eight) hours as needed. For pain.    Marland Kitchen LEVOTHYROXINE SODIUM 200 MCG PO TABS Oral Take 200 mcg by mouth daily. Name brand only    . OSTEO BI-FLEX ADV JOINT SHIELD PO TABS Oral Take 1 tablet by mouth daily.    Marland Kitchen ONDANSETRON 8 MG PO TBDP Oral Take 8 mg by mouth every 8 (eight) hours as needed. nausea    . ZOLPIDEM TARTRATE 5 MG PO TABS Oral Take 5 mg by mouth at bedtime.    . SULFAMETHOXAZOLE-TRIMETHOPRIM 800-160 MG PO TABS Oral Take  1 tablet by mouth 2 (two) times daily. 14 tablet 0  . TRAMADOL HCL 50 MG PO TABS Oral Take 1 tablet (50 mg total) by mouth every 6 (six) hours as needed for pain. 15 tablet 0    BP 151/60  Pulse 65  Temp 98.4 F (36.9 C) (Oral)  Resp 16  SpO2 100%  LMP 05/05/1990  Physical Exam  Nursing note and vitals reviewed. Constitutional: She is oriented to person, place, and time. She appears well-developed and well-nourished. No distress.  HENT:  Head: Normocephalic and atraumatic.  Mouth/Throat: Oropharynx is clear and moist.  Eyes: EOM are normal. Pupils are equal, round, and reactive to light.  Neck: Normal range of motion. Neck supple.  Cardiovascular: Normal rate and regular rhythm.   Pulmonary/Chest: Effort normal and breath sounds normal. No respiratory distress. She has no wheezes. She has no rales.  Abdominal: Soft. Bowel sounds are normal. She exhibits no  distension and no mass. There is tenderness (LLQ TTP. No rebound or guarding). There is no rebound and no guarding.  Musculoskeletal: Normal range of motion. She exhibits no edema and no tenderness.       No flank tenderness to percussion   Neurological: She is alert and oriented to person, place, and time.  Skin: Skin is warm and dry. No rash noted. No erythema.  Psychiatric: She has a normal mood and affect. Her behavior is normal.    ED Course  Procedures (including critical care time)  Labs Reviewed  CBC WITH DIFFERENTIAL - Abnormal; Notable for the following:    Hemoglobin 11.8 (*)     HCT 34.8 (*)     All other components within normal limits  COMPREHENSIVE METABOLIC PANEL - Abnormal; Notable for the following:    Glucose, Bld 111 (*)     Total Bilirubin 0.2 (*)     GFR calc non Af Amer 72 (*)     GFR calc Af Amer 83 (*)     All other components within normal limits  URINALYSIS, ROUTINE W REFLEX MICROSCOPIC - Abnormal; Notable for the following:    Color, Urine AMBER (*)  BIOCHEMICALS MAY BE AFFECTED BY COLOR   APPearance CLOUDY (*)     Leukocytes, UA MODERATE (*)     All other components within normal limits  URINE MICROSCOPIC-ADD ON - Abnormal; Notable for the following:    Squamous Epithelial / LPF FEW (*)     Bacteria, UA FEW (*)     All other components within normal limits  URINE CULTURE   Ct Abdomen Pelvis W Contrast  02/22/2012  *RADIOLOGY REPORT*  Clinical Data: Abdominal pain.  History of appendectomy.  CT ABDOMEN AND PELVIS WITH CONTRAST  Technique:  Multidetector CT imaging of the abdomen and pelvis was performed following the standard protocol during bolus administration of intravenous contrast.  Contrast: OMNIPAQUE IOHEXOL 300 MG/ML  SOLN  Comparison: 01/19/2006  Findings: The lung bases are clear.  There is no evidence for free intraperitoneal air.  Mild heterogeneity of the right hepatic lobe which is nonspecific. There is no focal abnormality within the  liver.  Normal appearance of the portal venous system and gallbladder.  Normal appearance of the spleen, pancreas and right adrenal gland.  Again noted are low density structures within the kidneys that most likely represent cysts.  There is mild nodularity of the left adrenal gland which is unchanged.  Again noted are small lymph nodes in the gastrohepatic ligament which have not significantly changed.  Small retroperitoneal lymph nodes appear stable.  No gross abnormality to the uterus are adnexa tissue.  There is fluid within the urinary bladder.  No gross abnormality to the small or large bowel.  No acute bony abnormality.  Marked degenerative disc disease at L4-L5 and L5-S1.  There is a hemangioma involving the T11 vertebral body.  IMPRESSION: No acute abnormalities within the abdomen or pelvis.   Original Report Authenticated By: Richarda Overlie, M.D.      1. UTI (urinary tract infection)       MDM   Pt symptoms have improved. UTI treated with IV abx and cultures drawn. Ct normal. Advised to d/c bactrim and cipro and f/u with PMD. Return for worsening symptoms or concerns       Loren Racer, MD 02/22/12 2129

## 2012-02-23 NOTE — ED Notes (Signed)
Prescription [facility] in [facility] [place] in pharmacy for vicodin 5/325mg  one q4h, dispense 10, no refills per [employer], pa-c.

## 2012-02-25 ENCOUNTER — Telehealth: Payer: Self-pay | Admitting: *Deleted

## 2012-02-25 NOTE — Telephone Encounter (Signed)
Patient called and wanted you to know that she had gotten really sick Sunday, so sick that she went to the ER(I know you are aware of this).  She had a CAT scan among other tests done-diagnosed with a UTI. ER doc gave her a shot of morphine and changed her abx to Bactrim. She was feeling a little bit better until yesterday she began to feel horrible again, today even worse. She states that she had a urine culture done but they stated it would not be ready until after 11:00am today. Patient would like to know if you can look at her culture and see if she is on the right medications. I told her I would ask, thanks.

## 2012-02-25 NOTE — Telephone Encounter (Signed)
Her culture shows enterococcus, but sensitivities aren't back yet to know which ABX are effective.  Continue on current ones until she hears back from [place] (probably tomorrow). She was given tramadol rx from ER, which is a pain pill.  Use this in interim to treat pain while waiting for results.

## 2012-02-25 NOTE — Telephone Encounter (Signed)
Patient advised.

## 2012-02-26 ENCOUNTER — Other Ambulatory Visit: Payer: Self-pay | Admitting: *Deleted

## 2012-02-26 DIAGNOSIS — N390 Urinary tract infection, site not specified: Secondary | ICD-10-CM

## 2012-02-26 LAB — URINE CULTURE: Colony Count: >=100000

## 2012-02-26 MED ORDER — AMOXICILLIN 875 MG PO TABS: 875.0000 mg | ORAL_TABLET | Freq: Two times a day (BID) | ORAL | Status: DC

## 2012-02-26 MED ORDER — HYDROCODONE-ACETAMINOPHEN 5-325 MG PO TABS: 1.0000 | ORAL_TABLET | Freq: Four times a day (QID) | ORAL | Status: DC | PRN

## 2012-02-26 NOTE — Telephone Encounter (Signed)
Okay for #6 of the Norco 5/325 (I think that is what you said she had)

## 2012-02-26 NOTE — Telephone Encounter (Signed)
Spoke with patient and she is not allergic to PCN. Called in amoxil 875 BID #14 to [facility]. Patient states that she took 2 of the norco last night and it did help some. She has 1 left and would like to know if you would be able to call her just a couple more for her to take for the next day or so until the abx gets in her system. Please advise, thanks.

## 2012-02-27 NOTE — ED Notes (Signed)
+   urine Chart sent to EDP office for review. 

## 2012-02-28 ENCOUNTER — Telehealth (HOSPITAL_COMMUNITY): Payer: Self-pay | Admitting: Emergency Medicine

## 2012-02-28 NOTE — ED Notes (Signed)
Chart returned from EDP office. Prescribed Nitrofurantoin 100 mg po qid x 7 days. Prescribed by Felicie Morn NP.

## 2012-02-29 ENCOUNTER — Telehealth (HOSPITAL_COMMUNITY): Payer: Self-pay | Admitting: Emergency Medicine

## 2012-03-01 ENCOUNTER — Telehealth (HOSPITAL_COMMUNITY): Payer: Self-pay | Admitting: Emergency Medicine

## 2012-03-02 NOTE — ED Notes (Signed)
rx called to [facility] by [facility] [phone]

## 2012-03-23 ENCOUNTER — Other Ambulatory Visit: Payer: Self-pay | Admitting: Family Medicine

## 2012-03-24 ENCOUNTER — Telehealth: Payer: Self-pay | Admitting: Internal Medicine

## 2012-03-24 ENCOUNTER — Other Ambulatory Visit: Payer: Self-pay | Admitting: *Deleted

## 2012-03-24 DIAGNOSIS — G4700 Insomnia, unspecified: Secondary | ICD-10-CM

## 2012-03-24 MED ORDER — ZOLPIDEM TARTRATE 5 MG PO TABS: 5.0000 mg | ORAL_TABLET | Freq: Every day | ORAL | Status: DC

## 2012-03-24 MED ORDER — ZOLPIDEM TARTRATE ER 6.25 MG PO TBCR: 6.2500 mg | EXTENDED_RELEASE_TABLET | Freq: Every evening | ORAL | Status: DC | PRN

## 2012-03-24 NOTE — Telephone Encounter (Signed)
Spoke with patient and she would love to try the ambien cr-called in 3 months to [facility].

## 2012-03-24 NOTE — Telephone Encounter (Signed)
It appears that she has been using the [place] almost daily since early August.  It is not meant for chronic, longterm use, just short-term or sporadic/intermittent.  If using longterm, then it is recommended that she change to Ambien CR 6.25mg , which is indicated for longterm use.  Or can make appt to discuss other meds--I know she didn't tolerate the trazadone.  Okay for #30 of the CR with 2 refills

## 2012-03-24 NOTE — Telephone Encounter (Signed)
Duplicate--see other message (which was for FIVE mg--pt not taking 10mg  dose)

## 2012-03-24 NOTE — Telephone Encounter (Signed)
Is this ok?

## 2012-03-24 NOTE — Telephone Encounter (Signed)
[facility] and said that the Ambien CR 6.25 generic form was $109, regula5mg  is only 10. Dr.Knapp ok'd me to call in #30, but patient was told to use sparingly as this form of the medication is not indicated for nightly use.

## 2012-03-29 ENCOUNTER — Encounter: Payer: Self-pay | Admitting: Family Medicine

## 2012-03-29 ENCOUNTER — Ambulatory Visit (INDEPENDENT_AMBULATORY_CARE_PROVIDER_SITE_OTHER): Payer: No Typology Code available for payment source | Admitting: Family Medicine

## 2012-03-29 VITALS — BP 138/80 | HR 72 | Temp 97.4°F | Ht 65.0 in | Wt 215.0 lb

## 2012-03-29 DIAGNOSIS — R6884 Jaw pain: Secondary | ICD-10-CM

## 2012-03-29 DIAGNOSIS — R51 Headache: Secondary | ICD-10-CM

## 2012-03-29 DIAGNOSIS — M62838 Other muscle spasm: Secondary | ICD-10-CM

## 2012-03-29 MED ORDER — CYCLOBENZAPRINE HCL 10 MG PO TABS: 5.0000 mg | ORAL_TABLET | Freq: Every evening | ORAL | Status: DC | PRN

## 2012-03-29 MED ORDER — NAPROXEN 500 MG PO TABS: 500.0000 mg | ORAL_TABLET | Freq: Two times a day (BID) | ORAL | Status: DC

## 2012-03-29 NOTE — Progress Notes (Signed)
Chief Complaint  Patient presents with  . Otalgia    left ear pain, started last week with a HA behind her left ear, throbbing and painful. Jaw pain and her ear feels warm. Also mentions right mid back pain x several weeks.   HPI:  Started 8 days ago with headache behind her left ear, into her head.  Lasted a few days, then went around to the right side, but never into the right ear (just headache).  She is having "catching" of her right jaw for about a week, with clicking/popping.  Feels "warm stuff" inside her ear, "heat" which is shortlived.  Thinks she might be grinding her teeth at night as she has been under a lot of stress.  No h/o bruxism, no mention by dentist.  No change in diet, no gum chewing.  Denies clenching her teeth.  Soreness in left ear which is constant, but occasional sharp twinges of pain in her left ear.  Took ibuprofen 600mg  BID for 4-5 days, last week, not recently, not really sure if it helped.  Also complaining of pain at her R back, in flank area which comes and goes throughout the day.  Denies radiation of pain.  Denies urgency, frequency, incontinence or hematuria.  Pain with certain movements where it "catches", tender to touch.  Past Medical History  Diagnosis Date  . Hypothyroid   . Depression   . Impaired fasting glucose 5/07  . Obesity   . Vitamin D deficiency    Past Surgical History  Procedure Date  . Thyroidectomy 1975  . Tonsillectomy   . Appendectomy   . Cesarean section   . Orif forearm fracture 2002  . Colonoscopy 02/04/2011    diverticulosis   History   Social History  . Marital Status: Divorced    Spouse Name: N/A    Number of Children: N/A  . Years of Education: N/A   Occupational History  . Not on file.   Social History Main Topics  . Smoking status: Former Smoker    Quit date: 05/05/1978  . Smokeless tobacco: Never Used  . Alcohol Use: Yes     Comment: occasionally, 2-3 times a week  . Drug Use: No  . Sexually Active: Not  Currently   Other Topics Concern  . Not on file   Social History Narrative  . No narrative on file   Current outpatient prescriptions:aspirin 81 MG tablet, Take 81 mg by mouth daily.  , Disp: , Rfl: ;  buPROPion (WELLBUTRIN XL) 300 MG 24 hr tablet, Take 300 mg by mouth daily., Disp: , Rfl: ;  Cholecalciferol (VITAMIN D) 1000 UNITS capsule, Take 1,000 Units by mouth daily.  , Disp: , Rfl: ;  fish oil-omega-3 fatty acids 1000 MG capsule, Take 2 g by mouth daily.  , Disp: , Rfl:  FLUoxetine (PROZAC) 20 MG capsule, Take 1 capsule (20 mg total) by mouth daily., Disp: 90 capsule, Rfl: 1;  levothyroxine (SYNTHROID, LEVOTHROID) 200 MCG tablet, Take 200 mcg by mouth daily. Name brand only, Disp: , Rfl: ;  Misc Natural Products (OSTEO BI-FLEX ADV JOINT SHIELD) TABS, Take 1 tablet by mouth daily., Disp: , Rfl: ;  zolpidem (AMBIEN) 5 MG tablet, Take 1 tablet (5 mg total) by mouth at bedtime., Disp: 30 tablet, Rfl: 0 HYDROcodone-acetaminophen (NORCO/VICODIN) 5-325 MG per tablet, Take 1-2 tablets by mouth every 6 (six) hours as needed., Disp: 6 tablet, Rfl: 0;  ibuprofen (ADVIL,MOTRIN) 200 MG tablet, Take 600 mg by mouth every 8 (  eight) hours as needed. For pain., Disp: , Rfl: ;  ondansetron (ZOFRAN-ODT) 8 MG disintegrating tablet, Take 8 mg by mouth every 8 (eight) hours as needed. nausea, Disp: , Rfl:  zolpidem (AMBIEN CR) 6.25 MG CR tablet, Take 1 tablet (6.25 mg total) by mouth at bedtime as needed., Disp: 30 tablet, Rfl: 2  No Known Allergies  ROS:  Denies fevers, denies URI or allergy symptoms.  Some bloody nose.  No numbness/tingling, weakness.  No decrease in hearing.  No nausea, vomiting. +intentional weight loss, which she attributes to the Wellbutrin decreasing her stress eating  PHYSICAL EXAM: BP 138/80  Pulse 72  Temp 97.4 F (36.3 C) (Oral)  Ht 5\' 5"  (1.651 m)  Wt 215 lb (97.523 kg)  BMI 35.78 kg/m2  LMP  05/05/1990  Well developed, pleasant female in no distress. HEENT:  PERRL,  conjunctiva clear. Tender at L mastoid and SCM with mild spasm.  Pain with SCM contraction/testing.  TM's and EAC's normal (curvy canal). OP clear. Sinuses nontender. Mild clicking of TMJ's bilaterally, nontender at TMJ.  Upon wiggling her jaw around, was able to cause acute onset of ear pain. Neck: no lymphadenopathy or mass Heart: regular rate and rhythm Lungs: clear bilaterally Back: no spine tenderness, no CVA tenderness.  Focal area of spasm at paraspinous muscle in thoracic spine, which reproduces pain.   ASSESSMENT/PLAN: 1. Muscle spasm  naproxen (NAPROSYN) 500 MG tablet, cyclobenzaprine (FLEXERIL) 10 MG tablet  2. Headache  naproxen (NAPROSYN) 500 MG tablet  3. TMJ (dislocation of temporomandibular joint)  naproxen (NAPROSYN) 500 MG tablet    Otalgia--likely some component related to TMJ, also having muscular headaches related to spasm in SCM. Thoracic paraspinous muscle spasm.  Naproxen 500mg  BID #30--risks and side effects reviewed Stop ibuprofen, may use Tylenol as needed for pain if pain not adequately controlled with naproxen alone. Flexeril --start at 1/2 tablet, increase to full if needed.  Use just at bedtime due to it making you sleepy, and make sure you have 8 hours before driving. Heat, stretches and massage to the back and neck will also help. If you are having ongoing jaw pain, despite the anti-inflammatories you might want to discuss with your dentist.  F/u prn

## 2012-03-29 NOTE — Patient Instructions (Signed)
  Naproxen 500mg  BID #30 Stop ibuprofen, may use Tylenol as needed for pain if pain not adequately controlled with naproxen alone. Flexeril --start at 1/2 tablet, increase to full if needed.  Use just at bedtime due to it making you sleepy, and make sure you have 8 hours before driving. Heat, stretches and massage to the back and neck will also help. If you are having ongoing jaw pain, despite the anti-inflammatories you might want to discuss with your dentist.

## 2012-05-18 ENCOUNTER — Telehealth: Payer: Self-pay | Admitting: Internal Medicine

## 2012-05-19 ENCOUNTER — Other Ambulatory Visit: Payer: Self-pay | Admitting: *Deleted

## 2012-05-19 ENCOUNTER — Telehealth: Payer: Self-pay | Admitting: *Deleted

## 2012-05-19 DIAGNOSIS — E039 Hypothyroidism, unspecified: Secondary | ICD-10-CM

## 2012-05-19 MED ORDER — LEVOTHYROXINE SODIUM 200 MCG PO TABS: 200.0000 ug | ORAL_TABLET | Freq: Every day | ORAL | Status: DC

## 2012-05-19 NOTE — Telephone Encounter (Signed)
Patient called and would like synthroid called into [facility]. She was seeing an endocrinologist but it is too difficult for her to get there. Wants to know if you would be able to continue her thyroid care? Please advise. Thanks.

## 2012-05-19 NOTE — Telephone Encounter (Signed)
Faxed back denied, patient sees endo please send to them.

## 2012-05-19 NOTE — Telephone Encounter (Signed)
Last TSH was in October, and was LOW (over-replaced).  Was her dose lowered after that lab result? Did she ever discuss those results with the endocrinologist? May need visit and repeat labs, so refill only until she can be seen again for labs.  If dose was lowered, she would have been due for recheck TSH 6-8 weeks after med change. So, either way we probably need to see her (to ask about symptoms, check weight and draw labs, as an OV, not a nurse visit)

## 2012-05-19 NOTE — Telephone Encounter (Signed)
Done

## 2012-06-03 ENCOUNTER — Institutional Professional Consult (permissible substitution): Payer: No Typology Code available for payment source | Admitting: Family Medicine

## 2012-06-19 ENCOUNTER — Other Ambulatory Visit: Payer: Self-pay

## 2012-06-23 ENCOUNTER — Ambulatory Visit (INDEPENDENT_AMBULATORY_CARE_PROVIDER_SITE_OTHER): Payer: No Typology Code available for payment source | Admitting: Family Medicine

## 2012-06-23 ENCOUNTER — Encounter: Payer: Self-pay | Admitting: Family Medicine

## 2012-06-23 VITALS — BP 148/74 | HR 72 | Temp 98.2°F | Ht 65.0 in | Wt 222.0 lb

## 2012-06-23 DIAGNOSIS — E039 Hypothyroidism, unspecified: Secondary | ICD-10-CM

## 2012-06-23 DIAGNOSIS — N390 Urinary tract infection, site not specified: Secondary | ICD-10-CM

## 2012-06-23 DIAGNOSIS — F329 Major depressive disorder, single episode, unspecified: Secondary | ICD-10-CM

## 2012-06-23 DIAGNOSIS — F3289 Other specified depressive episodes: Secondary | ICD-10-CM

## 2012-06-23 DIAGNOSIS — R109 Unspecified abdominal pain: Secondary | ICD-10-CM

## 2012-06-23 LAB — POCT URINALYSIS DIPSTICK
Nitrite, UA: NEGATIVE
Urobilinogen, UA: NEGATIVE
pH, UA: 6

## 2012-06-23 MED ORDER — SYNTHROID 200 MCG PO TABS: 200.0000 ug | ORAL_TABLET | Freq: Every day | ORAL | Status: DC

## 2012-06-23 MED ORDER — AMOXICILLIN 875 MG PO TABS: 875.0000 mg | ORAL_TABLET | Freq: Two times a day (BID) | ORAL | Status: DC

## 2012-06-23 MED ORDER — FLUOXETINE HCL 20 MG PO CAPS: 20.0000 mg | ORAL_CAPSULE | Freq: Every day | ORAL | Status: DC

## 2012-06-23 NOTE — Patient Instructions (Signed)
Call or return if worsening abdominal pain, fevers, or other symptoms develop. Take full course of antibiotic, drink plenty of fluids  Okay to remain off prozac if moods remain stable.  Refill sent to pharmacy to restart if needed.  Sign release forms for your endocrinologist so I can review records, see if I'm supposed to be keeping your thyroid suppressed with slightly higher dose or not.

## 2012-06-23 NOTE — Progress Notes (Signed)
Chief Complaint  Patient presents with  . Abdominal Pain    lower left sided abdominal pain x 8-10 days.    Seen for same symptoms back in October.  Eventually diagnosed as enterococcus UTI, and treated with amoxacillin.  Symptoms resolved but recurred about 10 days ago.  Pain is in LLQ.  She denies any urinary complaints--ie no dysuria, hematuria, urgency, frequency, but she recalls that she also did not have any urinary symptoms the last time this occurred.  She had CT done in ER which was normal (some chronic/stable changes).  Denies fevers, nausea, vomiting, diarrhea, constipation or changes in stool. Pain has gradually returned over the last 10 days.  Ibuprofen 800mg  eases the pain.  There is a focal area of tenderness in L lower abdomen. No pain with bending/twisting/movements.  4-5/10 pain.  Denies fevers, skin rashes, joint pains or other concerns.  No vaginal discharge, odor, itch  DepressioN:  She has been off her prozac for over a month.  Head has been feeling a little fuzzy.  She is still on Wellbutrin and moods overall are unchanged. Episode at work when she hadn't eaten, turned quickly and bent forward, and felt discomfort in her chest, a "swooshing" feeling, felt like she was going to pass out.  Lasted just for a few seconds.  Hypothyroidism--had been seeing endo in [place].  She had 1 rx refill left from former endo.  Has not missed any doses.  Unable to get out there to have ongoing care.  Records reviewed--last TSH was in October, looks like TSH was suppressed, but dose wasn't adjusted.  Unsure if she could have h/o nodules/goiter where she is supposed to be maintained on suppressive doses.  Will need to get records.  Past Medical History  Diagnosis Date  . Hypothyroid   . Depression   . Impaired fasting glucose 5/07  . Obesity   . Vitamin D deficiency    Past Surgical History  Procedure Laterality Date  . Thyroidectomy  1975  . Tonsillectomy    . Appendectomy    .  Cesarean section    . Orif forearm fracture  2002  . Colonoscopy  02/04/2011    diverticulosis   History   Social History  . Marital Status: Divorced    Spouse Name: N/A    Number of Children: N/A  . Years of Education: N/A   Occupational History  . Not on file.   Social History Main Topics  . Smoking status: Former Smoker    Quit date: 05/05/1978  . Smokeless tobacco: Never Used  . Alcohol Use: Yes     Comment: occasionally, 2-3 times a week  . Drug Use: No  . Sexually Active: Not Currently   Other Topics Concern  . Not on file   Social History Narrative  . No narrative on file   Current Outpatient Prescriptions on File Prior to Visit  Medication Sig Dispense Refill  . aspirin 81 MG tablet Take 81 mg by mouth daily.        Marland Kitchen buPROPion (WELLBUTRIN XL) 300 MG 24 hr tablet Take 300 mg by mouth daily.      . Cholecalciferol (VITAMIN D) 1000 UNITS capsule Take 1,000 Units by mouth daily.        . cyclobenzaprine (FLEXERIL) 10 MG tablet Take 0.5-1 tablets (5-10 mg total) by mouth at bedtime as needed for muscle spasms.  20 tablet  0  . fish oil-omega-3 fatty acids 1000 MG capsule Take 2 g by  mouth daily.        Marland Kitchen HYDROcodone-acetaminophen (NORCO/VICODIN) 5-325 MG per tablet Take 1-2 tablets by mouth every 6 (six) hours as needed.  6 tablet  0  . ibuprofen (ADVIL,MOTRIN) 200 MG tablet Take 600 mg by mouth every 8 (eight) hours as needed. For pain.      . Misc Natural Products (OSTEO BI-FLEX ADV JOINT SHIELD) TABS Take 1 tablet by mouth daily.      . naproxen (NAPROSYN) 500 MG tablet Take 1 tablet (500 mg total) by mouth 2 (two) times daily with a meal.  30 tablet  0  . ondansetron (ZOFRAN-ODT) 8 MG disintegrating tablet Take 8 mg by mouth every 8 (eight) hours as needed. nausea      . zolpidem (AMBIEN CR) 6.25 MG CR tablet Take 1 tablet (6.25 mg total) by mouth at bedtime as needed.  30 tablet  2  . zolpidem (AMBIEN) 5 MG tablet Take 1 tablet (5 mg total) by mouth at bedtime.  30  tablet  0   No current facility-administered medications on file prior to visit.   No Known Allergies  ROS: denies fevers, nausea, vomiting, diarrhea, skin rash, urinary symptoms, vaginal discharge, skin rash, hair changes.  Depression stable.  See HPI.  PHYSICAL EXAM: BP 148/74  Pulse 72  Temp(Src) 98.2 F (36.8 C)  Ht 5\' 5"  (1.651 m)  Wt 222 lb (100.699 kg)  BMI 36.94 kg/m2  LMP 05/05/1990 Pleasant female in no distress Neck: no lymphadenopathy, thyromegaly or nodules noted Heart: regular rate and rhythm with 2/6 SEM noted at RUSB Lungs: clear bilaterally Abdomen: soft, mildly tender at LLQ.  No rebound tenderness, guarding or mass.  No organomegaly. No suprapubic tenderness Skin: no rash Psych: normal mood, affect, hygiene and grooming  Urine dip: trace leuks and trace blood  ASSESSMENT/PLAN:  Abdominal  pain, other specified site - Plan: POCT Urinalysis Dipstick  Infection of urinary tract - Plan: Urine culture  Depressive disorder, not elsewhere classified - Plan: FLUoxetine (PROZAC) 20 MG capsule  Unspecified hypothyroidism - Plan: SYNTHROID 200 MCG tablet, TSH  Urinary tract infection, site not specified - Plan: amoxicillin (AMOXIL) 875 MG tablet  LLQ abdominal pain, with mildly abnormal urine dip--previously with same symptoms with enterococcus UTI. Therefore, will treat with amox for UTI while awaiting culture.  Differential diagnosis reviewed with patient.  Return if symptoms persist/worsen.  Depression--prefers not to restart prozac.  Continue wellbutrin.  Sent refill to have on hand in case moods worsen, in order to restart. Plans to wait and only restart if she gets worse.  Hypothyroidism--last TSH was 0.16 in October.  Dose was not changed after that lab test.  Needs TSH. Sign ROR to see if she needs to be on suppressive dose, or if TSH goal should be 1-3.

## 2012-06-25 NOTE — Progress Notes (Signed)
Quick Note:  CALLED PT CELL # PT STATES SYMPTOMS A LITTLE BETTER NOT GONE AWAY COMPLETELY AND SHE SAID SHE WANTED TO FINISH ANTIBIOTIC SHE WHAT EVER IT IS THE ANTIBIOTIC IS WORKING ______

## 2012-06-30 ENCOUNTER — Ambulatory Visit: Payer: No Typology Code available for payment source | Admitting: Family Medicine

## 2012-06-30 ENCOUNTER — Telehealth: Payer: Self-pay | Admitting: Family Medicine

## 2012-06-30 NOTE — Telephone Encounter (Signed)
Noted.  Will discuss at visit next week. She still needs to get TSH done--make sure she doesn't run out of her thyroid med prior to labs being done

## 2012-06-30 NOTE — Telephone Encounter (Signed)
Patient called to let you know that she was scheduled for appointment this afternoon but she has come down with a stomach virus today, came on her violently. She has diarrhea and vomiting. She rescheduled for next Monday @ 1:30pm. She just wanted you to know that she is still having pain in her lower left side and did finish the abx as prescribed. She has done some "research" and really strongly feels that she has IBS, she states that "she just knows that this is what's wrong with her." Just an FYI, thanks.

## 2012-06-30 NOTE — Telephone Encounter (Signed)
Spoke with patient and she stated that she is not out of her thyroid medication and she will be here on Monday.

## 2012-07-05 ENCOUNTER — Encounter: Payer: Self-pay | Admitting: Family Medicine

## 2012-07-05 ENCOUNTER — Ambulatory Visit (INDEPENDENT_AMBULATORY_CARE_PROVIDER_SITE_OTHER): Payer: No Typology Code available for payment source | Admitting: Family Medicine

## 2012-07-05 ENCOUNTER — Other Ambulatory Visit: Payer: Self-pay | Admitting: Family Medicine

## 2012-07-05 VITALS — BP 130/74 | HR 72 | Ht 65.0 in | Wt 219.0 lb

## 2012-07-05 DIAGNOSIS — E039 Hypothyroidism, unspecified: Secondary | ICD-10-CM

## 2012-07-05 DIAGNOSIS — R1032 Left lower quadrant pain: Secondary | ICD-10-CM

## 2012-07-05 LAB — CBC WITH DIFFERENTIAL/PLATELET
Basophils Relative: 0 % (ref 0–1)
Eosinophils Absolute: 0.1 10*3/uL (ref 0.0–0.7)
HCT: 33.1 % — ABNORMAL LOW (ref 36.0–46.0)
Hemoglobin: 11.4 g/dL — ABNORMAL LOW (ref 12.0–15.0)
MCH: 27.3 pg (ref 26.0–34.0)
MCHC: 34.4 g/dL (ref 30.0–36.0)
Monocytes Absolute: 0.5 10*3/uL (ref 0.1–1.0)
Monocytes Relative: 8 % (ref 3–12)

## 2012-07-05 MED ORDER — HYOSCYAMINE SULFATE 0.125 MG SL SUBL: 0.1250 mg | SUBLINGUAL_TABLET | SUBLINGUAL | Status: DC | PRN

## 2012-07-05 NOTE — Patient Instructions (Addendum)
Diverticulosis Diverticulosis is a common condition that develops when small pouches (diverticula) form in the wall of the colon. The risk of diverticulosis increases with age. It happens more often in people who eat a low-fiber diet. Most individuals with diverticulosis have no symptoms. Those individuals with symptoms usually experience abdominal pain, constipation, or loose stools (diarrhea). HOME CARE INSTRUCTIONS   Increase the amount of fiber in your diet as directed by your caregiver or dietician. This may reduce symptoms of diverticulosis.  Your caregiver may recommend taking a dietary fiber supplement.  Drink at least 6 to 8 glasses of water each day to prevent constipation.  Try not to strain when you have a bowel movement.  Your caregiver may recommend avoiding nuts and seeds to prevent complications, although this is still an uncertain benefit.  Only take over-the-counter or prescription medicines for pain, discomfort, or fever as directed by your caregiver. FOODS WITH HIGH FIBER CONTENT INCLUDE:  Fruits. Apple, peach, pear, tangerine, raisins, prunes.  Vegetables. Brussels sprouts, asparagus, broccoli, cabbage, carrot, cauliflower, romaine lettuce, spinach, summer squash, tomato, winter squash, zucchini.  Starchy Vegetables. Baked beans, kidney beans, lima beans, split peas, lentils, potatoes (with skin).  Grains. Whole wheat bread, brown rice, bran flake cereal, plain oatmeal, white rice, shredded wheat, bran muffins. SEEK IMMEDIATE MEDICAL CARE IF:   You develop increasing pain or severe bloating.  You have an oral temperature above 102 F (38.9 C), not controlled by medicine.  You develop vomiting or bowel movements that are bloody or black. Document Released: 01/17/2004 Document Revised: 07/14/2011 Document Reviewed: 09/19/2009 [facility] Patient Information 2013 [place], [place].  Irritable Bowel Syndrome Irritable Bowel Syndrome (IBS) is caused by a disturbance of  normal bowel function. Other terms used are spastic colon, mucous colitis, and irritable colon. It does not require surgery, nor does it lead to cancer. There is no cure for IBS. But with proper diet, stress reduction, and medication, you will find that your problems (symptoms) will gradually disappear or improve. IBS is a common digestive disorder. It usually appears in late adolescence or early adulthood. Women develop it twice as often as men. CAUSES  After food has been digested and absorbed in the small intestine, waste material is moved into the colon (large intestine). In the colon, water and salts are absorbed from the undigested products coming from the small intestine. The remaining residue, or fecal material, is held for elimination. Under normal circumstances, gentle, rhythmic contractions on the bowel walls push the fecal material along the colon towards the rectum. In IBS, however, these contractions are irregular and poorly coordinated. The fecal material is either retained too long, resulting in constipation, or expelled too soon, producing diarrhea. SYMPTOMS  The most common symptom of IBS is pain. It is typically in the lower left side of the belly (abdomen). But it may occur anywhere in the abdomen. It can be felt as heartburn, backache, or even as a dull pain in the arms or shoulders. The pain comes from excessive bowel-muscle spasms and from the buildup of gas and fecal material in the colon. This pain:  Can range from sharp belly (abdominal) cramps to a dull, continuous ache.  Usually worsens soon after eating.  Is typically relieved by having a bowel movement or passing gas. Abdominal pain is usually accompanied by constipation. But it may also produce diarrhea. The diarrhea typically occurs right after a meal or upon arising in the morning. The stools are typically soft and watery. They are often flecked with  secretions (mucus). Other symptoms of IBS include:  Bloating.  Loss  of appetite.  Heartburn.  Feeling sick to your stomach (nausea).  Belching  Vomiting  Gas. IBS may also cause a number of symptoms that are unrelated to the digestive system:  Fatigue.  Headaches.  Anxiety  Shortness of breath  Difficulty in concentrating.  Dizziness. These symptoms tend to come and go. DIAGNOSIS  The symptoms of IBS closely mimic the symptoms of other, more serious digestive disorders. So your caregiver may wish to perform a variety of additional tests to exclude these disorders. He/she wants to be certain of learning what is wrong (diagnosis). The nature and purpose of each test will be explained to you. TREATMENT A number of medications are available to help correct bowel function and/or relieve bowel spasms and abdominal pain. Among the drugs available are:  Mild, non-irritating laxatives for severe constipation and to help restore normal bowel habits.  Specific anti-diarrheal medications to treat severe or prolonged diarrhea.  Anti-spasmodic agents to relieve intestinal cramps.  Your caregiver may also decide to treat you with a mild tranquilizer or sedative during unusually stressful periods in your life. The important thing to remember is that if any drug is prescribed for you, make sure that you take it exactly as directed. Make sure that your caregiver knows how well it worked for you. HOME CARE INSTRUCTIONS   Avoid foods that are high in fat or oils. Some examples [id number]:[id number] cream, butter, frankfurters, sausage, and other fatty meats.  Avoid foods that have a laxative effect, such as fruit, fruit juice, and dairy products.  Cut out carbonated drinks, chewing gum, and "gassy" foods, such as beans and cabbage. This may help relieve bloating and belching.  Bran taken with plenty of liquids may help relieve constipation.  Keep track of what foods seem to trigger your symptoms.  Avoid emotionally charged situations or circumstances that produce  anxiety.  Start or continue exercising.  Get plenty of rest and sleep. MAKE SURE YOU:   Understand these instructions.  Will watch your condition.  Will get help right away if you are not doing well or get worse. Document Released: 04/21/2005 Document Revised: 07/14/2011 Document Reviewed: 12/10/2007 [facility] Patient Information 2013 [place], [place].  Try taking a fiber supplement once or twice daily (ie metamucil or citricel).  Drink plenty of fluids. Try the anti-spasmodic medication as needed for abdominal pain. Follow up with Dr. Leone Payor if you have ongoing pain

## 2012-07-05 NOTE — Progress Notes (Signed)
Chief Complaint  Patient presents with  . Follow-up    still having left side pain and bowel problems, think she may have IBS.   Patient presents with ongoing complaint of LLQ, same as previous visit.  She thinks she may have IBS.  Had a course of amoxacillin, as this is what seemed to be effective for similar episode of LLQ pain in October, but she had no improvement.  Urine culture this time was negative (positive for enterococcus back then).    She reports having had bad constipation a few months ago.  Most recently she has had some mild constipation, straining.  Has loose stools a couple of times/week.  Denies blood or mucus in the stool Ongoing pain in LLQ, which comes and goes.  She had a stomach virus last week (vomiting and diarrhea), resolved.  Still hasn't started back to eating meats.  Pain overall unchanged (didn't get worse or better with illness)  October--treated presumptively for diverticulitis and UTI (no insurance)--got worse, went to ER.  Had urine culture which showed enterococcus, and had CT which was normal.  Treated with amoxacillin and symptoms completely resolved.  When she returned recently, we presumptively put her back on amoxacillin but urine culture this time was normal, and her symptoms didn't improve with antibiotics.  Colonoscopy with Dr. Leone Payor 02/2011 showed diverticulosis.  Hypothyroidism--was supposed to have TSH drawn at last visit, but in error, it did not get done. She denies any fatigue, weight change, just the bowel changes as above.  Past Medical History  Diagnosis Date  . Hypothyroid   . Depression   . Impaired fasting glucose 5/07  . Obesity   . Vitamin D deficiency    Past Surgical History  Procedure Laterality Date  . Thyroidectomy  1975  . Tonsillectomy    . Appendectomy    . Cesarean section    . Orif forearm fracture  2002  . Colonoscopy  02/04/2011    diverticulosis   History   Social History  . Marital Status: Divorced     Spouse Name: N/A    Number of Children: N/A  . Years of Education: N/A   Occupational History  . Not on file.   Social History Main Topics  . Smoking status: Former Smoker    Quit date: 05/05/1978  . Smokeless tobacco: Never Used  . Alcohol Use: Yes     Comment: occasionally, 2-3 times a week  . Drug Use: No  . Sexually Active: Not Currently   Other Topics Concern  . Not on file   Social History Narrative  . No narrative on file   Current Outpatient Prescriptions on File Prior to Visit  Medication Sig Dispense Refill  . aspirin 81 MG tablet Take 81 mg by mouth daily.        Marland Kitchen buPROPion (WELLBUTRIN XL) 300 MG 24 hr tablet Take 300 mg by mouth daily.      . Cholecalciferol (VITAMIN D) 1000 UNITS capsule Take 1,000 Units by mouth daily.        . fish oil-omega-3 fatty acids 1000 MG capsule Take 2 g by mouth daily.        Marland Kitchen FLUoxetine (PROZAC) 20 MG capsule Take 1 capsule (20 mg total) by mouth daily.  90 capsule  1  . ibuprofen (ADVIL,MOTRIN) 200 MG tablet Take 600 mg by mouth every 8 (eight) hours as needed. For pain.      . Misc Natural Products (OSTEO BI-FLEX ADV JOINT SHIELD) TABS Take  1 tablet by mouth daily.      Marland Kitchen SYNTHROID 200 MCG tablet Take 1 tablet (200 mcg total) by mouth daily.  30 tablet  0  . zolpidem (AMBIEN) 5 MG tablet Take 1 tablet (5 mg total) by mouth at bedtime.  30 tablet  0  . cyclobenzaprine (FLEXERIL) 10 MG tablet Take 0.5-1 tablets (5-10 mg total) by mouth at bedtime as needed for muscle spasms.  20 tablet  0  . zolpidem (AMBIEN CR) 6.25 MG CR tablet Take 1 tablet (6.25 mg total) by mouth at bedtime as needed.  30 tablet  2   No current facility-administered medications on file prior to visit.   No Known Allergies  ROS:  Denies fevers.   LLQ abdominal pain, and recent vomiting, diarrhea, and intermittent constipation and diarrhea per HPI.  Denies cough, URI symptoms, shortness of breath, bleeding, bruising, or other concerns.  See HPI  PHYSICAL  EXAM: BP 130/74  Pulse 72  Ht 5\' 5"  (1.651 m)  Wt 219 lb (99.338 kg)  BMI 36.44 kg/m2  LMP 05/05/1990 Well developed, pleasant female in no distress Neck: no lymphadenopathy or mass Heart: regular rate and rhythm with 2/6 SEM loudest at RUSB Lungs: clear bilaterally Abdomen: normal bowel sounds.  +tenderness at LLQ.  No rebound tenderness or guarding.  No hernias Extremities: no edema Skin: no rash Psych: normal mood, affect, hygiene and grooming  ASSESSMENT/PLAN:  Unspecified hypothyroidism - Plan: TSH  LLQ abdominal pain - Plan: CBC with Differential, hyoscyamine (LEVSIN/SL) 0.125 MG SL tablet  LLQ pain--ddx includes constipation (although then pain should have improved after recent AGE), diverticulosis, diverticulitis, IBS.   Has known diverticulosis--high fiber diet recommended.  Add fiber supplement R/o diverticulitis--CT normal in 02/2012 when pain more severe (ER visit), so doubtful, but will check CBC and treat as outpt if WBC elevated. Will check TSH to r/o thyroid contributing to constipation. Consider IBS, trial of antispasmodic prn pain.  If ongoing LLQ pain, f/u with Dr. Leone Payor

## 2012-07-06 LAB — VITAMIN B12: Vitamin B-12: 913 pg/mL — ABNORMAL HIGH (ref 211–911)

## 2012-07-07 ENCOUNTER — Other Ambulatory Visit: Payer: Self-pay | Admitting: *Deleted

## 2012-07-07 DIAGNOSIS — E039 Hypothyroidism, unspecified: Secondary | ICD-10-CM

## 2012-07-07 MED ORDER — SYNTHROID 200 MCG PO TABS: 200.0000 ug | ORAL_TABLET | Freq: Every day | ORAL | Status: DC

## 2012-07-08 ENCOUNTER — Encounter (HOSPITAL_COMMUNITY): Payer: Self-pay | Admitting: *Deleted

## 2012-07-08 ENCOUNTER — Emergency Department (HOSPITAL_COMMUNITY)
Admission: EM | Admit: 2012-07-08 | Discharge: 2012-07-08 | Disposition: A | Discharge status: Home / Self Care | Payer: No Typology Code available for payment source | Attending: Emergency Medicine | Admitting: Emergency Medicine

## 2012-07-08 DIAGNOSIS — E039 Hypothyroidism, unspecified: Secondary | ICD-10-CM | POA: Insufficient documentation

## 2012-07-08 DIAGNOSIS — Z87891 Personal history of nicotine dependence: Secondary | ICD-10-CM | POA: Insufficient documentation

## 2012-07-08 DIAGNOSIS — R109 Unspecified abdominal pain: Secondary | ICD-10-CM

## 2012-07-08 DIAGNOSIS — Z8639 Personal history of other endocrine, nutritional and metabolic disease: Secondary | ICD-10-CM | POA: Insufficient documentation

## 2012-07-08 DIAGNOSIS — F329 Major depressive disorder, single episode, unspecified: Secondary | ICD-10-CM | POA: Insufficient documentation

## 2012-07-08 DIAGNOSIS — Z7982 Long term (current) use of aspirin: Secondary | ICD-10-CM | POA: Insufficient documentation

## 2012-07-08 DIAGNOSIS — Z79899 Other long term (current) drug therapy: Secondary | ICD-10-CM | POA: Insufficient documentation

## 2012-07-08 DIAGNOSIS — Z862 Personal history of diseases of the blood and blood-forming organs and certain disorders involving the immune mechanism: Secondary | ICD-10-CM | POA: Insufficient documentation

## 2012-07-08 DIAGNOSIS — E669 Obesity, unspecified: Secondary | ICD-10-CM | POA: Insufficient documentation

## 2012-07-08 DIAGNOSIS — R1032 Left lower quadrant pain: Secondary | ICD-10-CM | POA: Insufficient documentation

## 2012-07-08 DIAGNOSIS — F3289 Other specified depressive episodes: Secondary | ICD-10-CM | POA: Insufficient documentation

## 2012-07-08 LAB — COMPREHENSIVE METABOLIC PANEL
ALT: 23 U/L (ref 0–35)
AST: 22 U/L (ref 0–37)
Albumin: 3.4 g/dL — ABNORMAL LOW (ref 3.5–5.2)
CO2: 24 mEq/L (ref 19–32)
Calcium: 9.1 mg/dL (ref 8.4–10.5)
Creatinine, Ser: 0.54 mg/dL (ref 0.50–1.10)
GFR calc non Af Amer: >90 mL/min (ref >90)
Sodium: 135 mEq/L (ref 135–145)

## 2012-07-08 LAB — CBC WITH DIFFERENTIAL/PLATELET
Basophils Absolute: 0 10*3/uL (ref 0.0–0.1)
Basophils Relative: 0 % (ref 0–1)
Eosinophils Relative: 2 % (ref 0–5)
Lymphocytes Relative: 29 % (ref 12–46)
MCHC: 33.8 g/dL (ref 30.0–36.0)
MCV: 81.1 fL (ref 78.0–100.0)
Monocytes Absolute: 0.5 10*3/uL (ref 0.1–1.0)
Neutro Abs: 4.2 10*3/uL (ref 1.7–7.7)
Platelets: 203 10*3/uL (ref 150–400)
RDW: 13.8 % (ref 11.5–15.5)
WBC: 6.8 10*3/uL (ref 4.0–10.5)

## 2012-07-08 LAB — URINALYSIS, MICROSCOPIC ONLY
Glucose, UA: NEGATIVE mg/dL
Hgb urine dipstick: NEGATIVE
Specific Gravity, Urine: 1.027 (ref 1.005–1.030)
pH: 6 (ref 5.0–8.0)

## 2012-07-08 MED ORDER — SODIUM CHLORIDE 0.9 % IV SOLN
1000.0000 mL | Freq: Once | INTRAVENOUS | Status: AC
  Administered 2012-07-08: 1000 mL via INTRAVENOUS

## 2012-07-08 MED ORDER — HYDROMORPHONE HCL PF 1 MG/ML IJ SOLN
1.0000 mg | Freq: Once | INTRAMUSCULAR | Status: AC
  Administered 2012-07-08: 1 mg via INTRAVENOUS
  Filled 2012-07-08: qty 1

## 2012-07-08 MED ORDER — SODIUM CHLORIDE 0.9 % IV SOLN
1000.0000 mL | INTRAVENOUS | Status: DC
  Administered 2012-07-08: 1000 mL via INTRAVENOUS

## 2012-07-08 MED ORDER — DICYCLOMINE HCL 20 MG PO TABS: 20.0000 mg | ORAL_TABLET | Freq: Two times a day (BID) | ORAL | Status: DC

## 2012-07-08 MED ORDER — ONDANSETRON HCL 4 MG/2ML IJ SOLN
4.0000 mg | Freq: Once | INTRAMUSCULAR | Status: AC
  Administered 2012-07-08: 4 mg via INTRAVENOUS
  Filled 2012-07-08: qty 2

## 2012-07-08 NOTE — ED Provider Notes (Signed)
History    CSN: [id number] Arrival date & time 07/08/12  1807 First MD Initiated Contact with Patient 07/08/12 1855      Chief Complaint  Patient presents with  . Abdominal Pain    HPI Comments: Patient has been having this problem waxing and waning since October..  She has been treated presumptively for diverticulitis as well as for an enterococcus UTI. She had a CT scan in October that was normal.  She saw her primary Dr. recently and is getting a referral to GI but her symptoms increased again today so she could not wait.  Patient is a 67 y.o. female presenting with abdominal pain. The history is provided by the patient.  Abdominal Pain Pain location:  LLQ Pain quality: sharp   Pain radiates to:  Back Pain severity:  Moderate Onset quality:  Gradual Duration: since october. Timing:  Intermittent Chronicity:  Recurrent Relieved by:  Nothing Associated symptoms: no chills, no dysuria and no nausea    patient has had some episodes of constipation associated with these spells. She currently is not having any difficulty with that  Past Medical History  Diagnosis Date  . Hypothyroid   . Depression   . Impaired fasting glucose 5/07  . Obesity   . Vitamin D deficiency     Past Surgical History  Procedure Laterality Date  . Thyroidectomy  1975  . Tonsillectomy    . Appendectomy    . Cesarean section    . Orif forearm fracture  2002  . Colonoscopy  02/04/2011    diverticulosis    Family History  Problem Relation Age of Onset  . Parkinsonism Mother   . Diabetes Brother   . Heart disease Brother   . Cancer Neg Hx   . Colon cancer Neg Hx   . Stomach cancer Neg Hx   . HIV Brother     History  Substance Use Topics  . Smoking status: Former Smoker    Quit date: 05/05/1978  . Smokeless tobacco: Never Used  . Alcohol Use: Yes     Comment: occasionally, 2-3 times a week    OB History   Grav Para Term Preterm Abortions TAB SAB Ect Mult Living                   Review of Systems  Constitutional: Negative for chills.  Gastrointestinal: Positive for abdominal pain. Negative for nausea.  Genitourinary: Negative for dysuria.    Allergies  Review of patient's allergies indicates no known allergies.  Home Medications   Current Outpatient Rx  Name  Route  Sig  Dispense  Refill  . aspirin 81 MG tablet   Oral   Take 81 mg by mouth daily.           Marland Kitchen buPROPion (WELLBUTRIN XL) 300 MG 24 hr tablet   Oral   Take 300 mg by mouth daily.         . Cholecalciferol (VITAMIN D) 1000 UNITS capsule   Oral   Take 1,000 Units by mouth daily.           . fish oil-omega-3 fatty acids 1000 MG capsule   Oral   Take 2 g by mouth daily.           Marland Kitchen FLUoxetine (PROZAC) 20 MG capsule   Oral   Take 1 capsule (20 mg total) by mouth daily.   90 capsule   1     Please keep this rx on file for pt.  She will call ...   . ibuprofen (ADVIL,MOTRIN) 200 MG tablet   Oral   Take 600 mg by mouth every 8 (eight) hours as needed. For pain.         . Misc Natural Products (OSTEO BI-FLEX ADV JOINT SHIELD) TABS   Oral   Take 1 tablet by mouth daily.         . Probiotic Product (PROBIOTIC PO)   Oral   Take 1 capsule by mouth daily.         Marland Kitchen SYNTHROID 200 MCG tablet   Oral   Take 1 tablet (200 mcg total) by mouth daily.   30 tablet   11     Dispense as written.   . zolpidem (AMBIEN) 5 MG tablet   Oral   Take 1 tablet (5 mg total) by mouth at bedtime.   30 tablet   0     BP 158/74  Pulse 74  Temp(Src) 97.9 F (36.6 C) (Oral)  Resp 16  SpO2 99%  LMP 05/05/1990  Physical Exam  Nursing note and vitals reviewed. Constitutional: She appears well-developed and well-nourished. No distress.  Obese  HENT:  Head: Normocephalic and atraumatic.  Right Ear: External ear normal.  Left Ear: External ear normal.  Eyes: Conjunctivae are normal. Right eye exhibits no discharge. Left eye exhibits no discharge. No scleral icterus.  Neck: Neck  supple. No tracheal deviation present.  Cardiovascular: Normal rate, regular rhythm and intact distal pulses.   Pulmonary/Chest: Effort normal and breath sounds normal. No stridor. No respiratory distress. She has no wheezes. She has no rales.  Abdominal: Soft. Bowel sounds are normal. She exhibits no distension. There is tenderness in the left lower quadrant. There is no rebound, no guarding and no CVA tenderness. No hernia.  Musculoskeletal: She exhibits no edema and no tenderness.  Neurological: She is alert. She has normal strength. No sensory deficit. Cranial nerve deficit:  no gross defecits noted. She exhibits normal muscle tone. She displays no seizure activity. Coordination normal.  Skin: Skin is warm and dry. No rash noted.  Psychiatric: She has a normal mood and affect.    ED Course  Procedures (including critical care time)  Labs Reviewed  CBC WITH DIFFERENTIAL - Abnormal; Notable for the following:    HCT 35.5 (*)    All other components within normal limits  COMPREHENSIVE METABOLIC PANEL - Abnormal; Notable for the following:    Glucose, Bld 113 (*)    Albumin 3.4 (*)    Alkaline Phosphatase 121 (*)    Total Bilirubin 0.2 (*)    All other components within normal limits  URINALYSIS, MICROSCOPIC ONLY - Abnormal; Notable for the following:    Color, Urine AMBER (*)    APPearance CLOUDY (*)    Leukocytes, UA SMALL (*)    All other components within normal limits  LIPASE, BLOOD  URINALYSIS, ROUTINE W REFLEX MICROSCOPIC   No results found.    MDM  The patient presents with this recurrent left lower abdominal pain. Previously she had a CT scan that did not show evidence of diverticulitis. Her laboratory testing in the emergency department are unremarkable.  At this time do not feel there is evidence to suggest an acute emergency medical condition. Her abdomen is benign and she doesn't appear in any acute surgical process. The patient can safely followup with her primary  Dr. and gastroenterologist for further evaluation        Celene Kras, MD 07/08/12 2121

## 2012-07-08 NOTE — ED Notes (Addendum)
Pt reports left sided abdominal pain. Was seen at pcp on 3/3 wth possible dx of diverticulosis. Pt reports left sided abdominal pain that now radiates into back, 7/10 pain. Denies n/v/d. Was suppose to be seen at pcp again tom, but could not wait due to pain.

## 2012-07-19 ENCOUNTER — Telehealth: Payer: Self-pay | Admitting: *Deleted

## 2012-07-19 DIAGNOSIS — R109 Unspecified abdominal pain: Secondary | ICD-10-CM

## 2012-07-19 MED ORDER — DICYCLOMINE HCL 20 MG PO TABS: ORAL_TABLET | ORAL | Status: DC

## 2012-07-19 NOTE — Telephone Encounter (Signed)
Patient called and just wanted to let you know that after she last saw you she was in so much pain that she went to ER. She was given Bentyl and she is finally well. Just wanted it noted in her chart.

## 2012-07-19 NOTE — Telephone Encounter (Signed)
done

## 2012-07-19 NOTE — Telephone Encounter (Signed)
Patient called back and would like to know if you would be able to call in an rx to [facility] on [address] if she has a flare up she has some on hand.

## 2012-08-13 ENCOUNTER — Telehealth: Payer: Self-pay | Admitting: Family Medicine

## 2012-08-13 ENCOUNTER — Encounter: Payer: Self-pay | Admitting: Internal Medicine

## 2012-08-13 NOTE — Telephone Encounter (Signed)
Pt called and stated that she is again having ab pain. She states that it is bad today. She states that last time she was in you told her if continued you would make a referral to Dr. Leone Payor. She would like to get that started asap if possible. Please make referral and call pt with status.

## 2012-08-13 NOTE — Telephone Encounter (Signed)
Ok for referral to Dr. Leone Payor.  She has had multiple visits with me, and ER visits for this

## 2012-08-13 NOTE — Telephone Encounter (Signed)
Patient is aware of her appointment to Dr. Leone Payor at [facility] on May 14.2014 @ 845 pm. CLS Patient is aware that this is his first available appointment and she will be placed on the cancellation list. Patient said to keep the appointment and she will wait and maybe they will have a cancellation so she can be seen early. CLS   Patient is aware that Dr. Lynelle Doctor want to refer her to see Dr. Leone Payor for the Abd. Pain. CLS

## 2012-08-25 ENCOUNTER — Telehealth: Payer: Self-pay | Admitting: *Deleted

## 2012-08-25 NOTE — Telephone Encounter (Signed)
Patient called and wanted to let you know that she has stopped taking Ambien completely she was afraid of taking too often and never being able to sleep again without it. She has a current rx for 20mg  of prozac, she was on 40mg  some time ago. She said that she has been having a lot of depression/anxiety issues in dealing with her son's divorce.  She went back up to the 40mg  and states that she is much better on the 40mg , are you okay with continuing on this dose? If so she will need a refill sent to [facility] as she has #7 of the 20mg  remaining. Thanks.

## 2012-08-25 NOTE — Telephone Encounter (Signed)
We have only rx'd 20mg  in the last year (she had decreased dose from 40 to 20 prior to July 2013 visit, where she returned with worsening depression and wellbutrin was started).  OV is needed for f/u on depression.  Med refills and dose changes are not made through phone encounters.

## 2012-08-26 ENCOUNTER — Ambulatory Visit (INDEPENDENT_AMBULATORY_CARE_PROVIDER_SITE_OTHER): Payer: Self-pay | Admitting: Family Medicine

## 2012-08-26 ENCOUNTER — Encounter: Payer: Self-pay | Admitting: Family Medicine

## 2012-08-26 VITALS — BP 128/80 | HR 68 | Ht 64.0 in | Wt 217.0 lb

## 2012-08-26 DIAGNOSIS — F329 Major depressive disorder, single episode, unspecified: Secondary | ICD-10-CM

## 2012-08-26 DIAGNOSIS — F3289 Other specified depressive episodes: Secondary | ICD-10-CM

## 2012-08-26 MED ORDER — FLUOXETINE HCL 40 MG PO CAPS: 40.0000 mg | ORAL_CAPSULE | Freq: Every day | ORAL | Status: DC

## 2012-08-26 NOTE — Progress Notes (Signed)
Chief Complaint  Patient presents with  . follow-up    follow-up on med   Patient presents for follow up on her depression.  Her son is going through a divorce, and she has trouble sleeping.  She tried Ambien, but she didn't like the way it made her feel, and she was afraid of getting hooked on it. She stopped taking it several weeks ago.  She got some counseling at the [facility] (last went a few months ago).  She went back up to 40mg  of Prozac about 3 weeks ago, and within 2-3 days she noticed she was sleeping better.  She continues to take Wellbutrin daily.  "This is the best I have felt in a long time", no longer having trouble falling asleep.   She tried Trazadone and didn't like (recalls "weird sensations", heart raced, felt "off", like she thought she would faint during the day).  She has tried melatonin and diphenhydramine-containing OTC meds without benefit.  She still has some left sided abdominal pain, and has appt scheduled with Dr. Leone Payor.  Past Medical History  Diagnosis Date  . Hypothyroid   . Depression   . Impaired fasting glucose 5/07  . Obesity   . Vitamin D deficiency    Current Outpatient Prescriptions on File Prior to Visit  Medication Sig Dispense Refill  . aspirin 81 MG tablet Take 81 mg by mouth daily.        Marland Kitchen buPROPion (WELLBUTRIN XL) 300 MG 24 hr tablet Take 300 mg by mouth daily.      . Cholecalciferol (VITAMIN D) 1000 UNITS capsule Take 1,000 Units by mouth daily.        . fish oil-omega-3 fatty acids 1000 MG capsule Take 2 g by mouth daily.        Marland Kitchen ibuprofen (ADVIL,MOTRIN) 200 MG tablet Take 600 mg by mouth every 8 (eight) hours as needed. For pain.      . Misc Natural Products (OSTEO BI-FLEX ADV JOINT SHIELD) TABS Take 1 tablet by mouth daily.      . Probiotic Product (PROBIOTIC PO) Take 1 capsule by mouth daily.      Marland Kitchen SYNTHROID 200 MCG tablet Take 1 tablet (200 mcg total) by mouth daily.  30 tablet  11  . dicyclomine (BENTYL) 20 MG tablet Take 1  tablet every 6 hours as needed for abdominal pain  30 tablet  0  . zolpidem (AMBIEN) 5 MG tablet Take 1 tablet (5 mg total) by mouth at bedtime.  30 tablet  0   No current facility-administered medications on file prior to visit.   No Known Allergies  ROS:  Denies fevers, URI symptoms, weight changes, thyroid symptoms.  +abdominal pain.  Some L hip pain since [place].  See HPI  PHYSICAL EXAM: BP 128/80  Pulse 68  Ht 5\' 4"  (1.626 m)  Wt 217 lb (98.431 kg)  BMI 37.23 kg/m2  LMP 05/05/1990  Well developed, pleasant female in no distress.  Mildly antalgic gait noted. Normal mood, affect, hygiene and grooming.  Normal speech, eye contact.  ASSESSMENT/PLAN: Depressive disorder, not elsewhere classified - Plan: FLUoxetine (PROZAC) 40 MG capsule  Continue with prozac 40mg , along with the wellbutrin.  If sleep issues recur, can retry agents she has at home (ie melatonin, benadryl), and okay to go back to using [place] as needed.  Encouraged to continue with regular counseling.  Briefly discussed stress reduction techniques.  F/u with GI as scheduled re:  ongoing issues with L sided abdominal pain

## 2012-08-26 NOTE — Patient Instructions (Signed)
Continue with prozac 40, along with the wellbutrin.  If sleep issues recur, can retry agents she has at home (ie melatonin, benadryl), and okay to go back to using [place] as needed. Continue with regular counseling.

## 2012-08-27 ENCOUNTER — Telehealth: Payer: Self-pay | Admitting: Family Medicine

## 2012-08-27 DIAGNOSIS — F3289 Other specified depressive episodes: Secondary | ICD-10-CM

## 2012-08-27 DIAGNOSIS — F329 Major depressive disorder, single episode, unspecified: Secondary | ICD-10-CM

## 2012-08-27 NOTE — Telephone Encounter (Signed)
lm

## 2012-09-15 ENCOUNTER — Ambulatory Visit: Payer: No Typology Code available for payment source | Admitting: Internal Medicine

## 2012-09-15 ENCOUNTER — Telehealth: Payer: Self-pay | Admitting: Family Medicine

## 2012-09-15 NOTE — Telephone Encounter (Signed)
noted 

## 2012-09-22 ENCOUNTER — Telehealth: Payer: Self-pay | Admitting: Internal Medicine

## 2012-09-22 NOTE — Telephone Encounter (Signed)
She mentioned left hip pain that started the day prior to visit (for depression f/u), after [place].  This was not evaluated, and may not be related to a muscle spasm.  Needs eval for muscle relaxants

## 2012-09-22 NOTE — Telephone Encounter (Signed)
Pt is having left leg pain last 3 weeks and she said she mention it to you the last time she was in. She has keep it wrapped, ice and elevated and would like a muscle relaxer to relax the muscle. send to [facility]

## 2012-09-22 NOTE — Telephone Encounter (Signed)
Spoke with patient and scheduled her with Dr.Lalonde for 09/24/12 @ 9:15 am she would like a phone call if anyone cancels tomorrow.

## 2012-09-24 ENCOUNTER — Ambulatory Visit: Payer: Self-pay | Admitting: Family Medicine

## 2012-11-08 ENCOUNTER — Telehealth: Payer: Self-pay | Admitting: Internal Medicine

## 2012-11-08 DIAGNOSIS — G4700 Insomnia, unspecified: Secondary | ICD-10-CM

## 2012-11-08 MED ORDER — ZOLPIDEM TARTRATE 5 MG PO TABS: 5.0000 mg | ORAL_TABLET | Freq: Every day | ORAL | Status: DC

## 2012-11-08 NOTE — Telephone Encounter (Signed)
Spoke with patient and she would like #30 called in just to use on occasion, if needed.

## 2012-11-08 NOTE — Telephone Encounter (Signed)
This was a fax refill request.  Per OV in April, pt stated she wasn't using med, didn't like the way it made her feel.  I'm okay with a refill of #30 if in fact she is using only occasionally, and is effective, without side effects

## 2012-11-08 NOTE — Telephone Encounter (Signed)
Refill request for ambien 5mg  to [facility]

## 2012-12-09 DIAGNOSIS — H2510 Age-related nuclear cataract, unspecified eye: Secondary | ICD-10-CM | POA: Diagnosis not present

## 2012-12-21 DIAGNOSIS — M1710 Unilateral primary osteoarthritis, unspecified knee: Secondary | ICD-10-CM | POA: Diagnosis not present

## 2012-12-21 DIAGNOSIS — M25569 Pain in unspecified knee: Secondary | ICD-10-CM | POA: Diagnosis not present

## 2012-12-22 DIAGNOSIS — L819 Disorder of pigmentation, unspecified: Secondary | ICD-10-CM | POA: Diagnosis not present

## 2012-12-22 DIAGNOSIS — D235 Other benign neoplasm of skin of trunk: Secondary | ICD-10-CM | POA: Diagnosis not present

## 2012-12-22 DIAGNOSIS — D1801 Hemangioma of skin and subcutaneous tissue: Secondary | ICD-10-CM | POA: Diagnosis not present

## 2013-01-07 DIAGNOSIS — M1710 Unilateral primary osteoarthritis, unspecified knee: Secondary | ICD-10-CM | POA: Diagnosis not present

## 2013-01-14 DIAGNOSIS — M1710 Unilateral primary osteoarthritis, unspecified knee: Secondary | ICD-10-CM | POA: Diagnosis not present

## 2013-01-17 DIAGNOSIS — Z23 Encounter for immunization: Secondary | ICD-10-CM | POA: Diagnosis not present

## 2013-01-21 DIAGNOSIS — M1710 Unilateral primary osteoarthritis, unspecified knee: Secondary | ICD-10-CM | POA: Diagnosis not present

## 2013-02-02 ENCOUNTER — Telehealth: Payer: Self-pay | Admitting: Family Medicine

## 2013-02-02 MED ORDER — BUPROPION HCL ER (XL) 300 MG PO TB24: 300.0000 mg | ORAL_TABLET | Freq: Every day | ORAL | Status: DC

## 2013-02-02 NOTE — Telephone Encounter (Signed)
Pt called requesting refill on wellbutrin. She states she is out. Please sent to [facility] [address]. Pt states she is doing well and doesn't think she needs to come in. I informed pt you would let her know if an appt was required.

## 2013-02-02 NOTE — Telephone Encounter (Signed)
She is due for 6 month f/u on depression.  If doing well, okay to refill x 6 months, but she needs to schedule CPE--I don't see any documented in computer.  She only comes for acute visit.  Please schedule for CPE within the next 3-6 months and refill wellbutrin x 6 months.

## 2013-02-02 NOTE — Telephone Encounter (Signed)
Pt is coming in for a CPE on 06/08/13. i have sent med to pharmacy

## 2013-02-09 ENCOUNTER — Telehealth: Payer: Self-pay | Admitting: Family Medicine

## 2013-02-09 NOTE — Telephone Encounter (Signed)
As previously recommended, she needs to schedule a PHYSICAL. Yes, pneumonia vaccine is recommended for 40 year olds, given at their physicals.

## 2013-02-09 NOTE — Telephone Encounter (Signed)
Pt wants to know if you think she should  have a PNEUMONIA shot. Please inform pt.

## 2013-02-10 NOTE — Telephone Encounter (Signed)
There is no particular rush (it isn't a seasonal thing), so it can be given at her physical.  If she prefers to have one sooner, she should get Prevnar along with the high dose flu shot at our office.  Okay for NV.  (there are two different types of pneumonia vaccines, and I'm happy to discuss the differences and recommendations at her visit; if she prefers sooner, my recommendation is Prevnar now (and possibly pneumovax in 5 years), along with flu shot)

## 2013-02-10 NOTE — Telephone Encounter (Signed)
Pt does have a CPE scheduled for Feb. Pt would like to know it she can have shot before then. Please advise.

## 2013-02-10 NOTE — Telephone Encounter (Signed)
Pt was advised of Dr. Delford Field recommendations she states right now she has a head cold but will call back to set up an appt.

## 2013-03-08 ENCOUNTER — Ambulatory Visit (INDEPENDENT_AMBULATORY_CARE_PROVIDER_SITE_OTHER): Payer: Medicare Other | Admitting: Medical

## 2013-03-08 ENCOUNTER — Telehealth: Payer: Self-pay | Admitting: Family Medicine

## 2013-03-08 ENCOUNTER — Encounter: Payer: Self-pay | Admitting: Medical

## 2013-03-08 VITALS — BP 150/80 | HR 86 | Temp 98.2°F | Resp 20 | Wt 223.0 lb

## 2013-03-08 DIAGNOSIS — R062 Wheezing: Secondary | ICD-10-CM | POA: Diagnosis not present

## 2013-03-08 DIAGNOSIS — R059 Cough, unspecified: Secondary | ICD-10-CM

## 2013-03-08 DIAGNOSIS — R05 Cough: Secondary | ICD-10-CM

## 2013-03-08 MED ORDER — HYDROCODONE-HOMATROPINE 5-1.5 MG/5ML PO SYRP: 5.0000 mL | ORAL_SOLUTION | Freq: Three times a day (TID) | ORAL | Status: DC | PRN

## 2013-03-08 MED ORDER — ALBUTEROL SULFATE HFA 108 (90 BASE) MCG/ACT IN AERS: 2.0000 | INHALATION_SPRAY | Freq: Four times a day (QID) | RESPIRATORY_TRACT | Status: DC | PRN

## 2013-03-08 MED ORDER — LEVOFLOXACIN 500 MG PO TABS: 500.0000 mg | ORAL_TABLET | Freq: Every day | ORAL | Status: DC

## 2013-03-08 NOTE — Progress Notes (Signed)
Subjective:  Katie Kaiser is a 67 y.o. female who presents for cough.  She notes 3 day hx/o dry cough, head congestion, bloody mucous drainage from nose.  Has felt a little SOB.  Denies fever, NV, ear pain, sore throat.  Treatment to date: cough suppressants.  Takes Claritin D in general.  + sick contacts.   She does not smoke.  Last year had similar symptoms, ended up being diagnosed with pneumonia.  No other aggravating or relieving factors.  No other c/o.  The following portions of the patient's history were reviewed and updated as appropriate: allergies, current medications, past family history, past medical history, past social history, past surgical history and problem list.  ROS as in subjective  Past Medical History  Diagnosis Date  . Hypothyroid   . Depression   . Impaired fasting glucose 5/07  . Obesity   . Vitamin D deficiency   . Diverticulosis     Objective: BP 150/80  Pulse 86  Temp(Src) 98.2 F (36.8 C) (Oral)  Resp 20  Wt 223 lb (101.152 kg)  SpO2 98%  LMP 05/05/1990   General appearance: Alert, WD/WN, no distress,somewhat ill appearing                             Skin: warm, no rash, no diaphoresis                           Head: no sinus tenderness                            Eyes: conjunctiva normal, corneas clear, PERRLA                            Ears: pearly TMs, external ear canals normal                          Nose: septum midline, turbinates swollen, with erythema and dry mucoid discharge             Mouth/throat: MMM, tongue normal, mild pharyngeal erythema                           Neck: supple, no adenopathy, no thyromegaly, nontender                          Heart: RRR, normal S1, S2, no murmurs                         Lungs: +bronchial breath sounds, +scattered rhonchi, few faint wheezes, no rales                Extremities: no edema, nontender     Assessment: Encounter Diagnoses  Name Primary?  . Cough Yes  . Wheezing      Plan:   Reviewed 09/2011 pneumonia with minimal symptoms.  Given current clinical picture despite vitals relatively normal, begin Levaquin, albuterol inhaler, hycodan prn QHS.  Hydrate well, rest, avoid other until improving.   Call/return in 2-3 days if symptoms are worse or not improving.

## 2013-03-09 ENCOUNTER — Telehealth: Payer: Self-pay | Admitting: Internal Medicine

## 2013-03-09 ENCOUNTER — Other Ambulatory Visit: Payer: Self-pay | Admitting: Medical

## 2013-03-09 MED ORDER — LEVOFLOXACIN 500 MG PO TABS: 500.0000 mg | ORAL_TABLET | Freq: Every day | ORAL | Status: DC

## 2013-03-09 NOTE — Telephone Encounter (Signed)
Discussed in detail with patient, all questions answered.  Will discuss the pneumonia vaccine (and give) at her upcoming CPE

## 2013-03-09 NOTE — Telephone Encounter (Signed)
Pt called this morning stating she has lost her levaquin bottle with 6 pills left in it. She states she took the first pill at the pharmacy waiting for her syrup to get filled but then she don't know what she did with it. shes not sure if it dropped out of her bag or what. But she needs it sent for 6 pills again to [facility]. Call pt when you have sent it

## 2013-03-10 ENCOUNTER — Other Ambulatory Visit: Payer: Self-pay

## 2013-03-15 ENCOUNTER — Encounter (HOSPITAL_COMMUNITY): Payer: Self-pay | Admitting: Emergency Medicine

## 2013-03-15 ENCOUNTER — Emergency Department (HOSPITAL_COMMUNITY)
Admission: EM | Admit: 2013-03-15 | Discharge: 2013-03-16 | Discharge status: Against Medical Advice | Payer: Medicare Other | Attending: Emergency Medicine | Admitting: Emergency Medicine

## 2013-03-15 DIAGNOSIS — E669 Obesity, unspecified: Secondary | ICD-10-CM | POA: Diagnosis not present

## 2013-03-15 DIAGNOSIS — T1490XA Injury, unspecified, initial encounter: Secondary | ICD-10-CM | POA: Insufficient documentation

## 2013-03-15 DIAGNOSIS — R059 Cough, unspecified: Secondary | ICD-10-CM | POA: Diagnosis not present

## 2013-03-15 DIAGNOSIS — W540XXA Bitten by dog, initial encounter: Secondary | ICD-10-CM | POA: Insufficient documentation

## 2013-03-15 DIAGNOSIS — Y939 Activity, unspecified: Secondary | ICD-10-CM | POA: Insufficient documentation

## 2013-03-15 DIAGNOSIS — Y929 Unspecified place or not applicable: Secondary | ICD-10-CM | POA: Insufficient documentation

## 2013-03-15 DIAGNOSIS — R05 Cough: Secondary | ICD-10-CM | POA: Insufficient documentation

## 2013-03-15 NOTE — ED Notes (Signed)
Pt reports she has been coughing since last Tuesday, was put on ABX and cough began today.

## 2013-03-15 NOTE — ED Notes (Signed)
Pt reports that she was bit by her dog tonight as she startled him coughing. Pt reports her dog is up to date on all vaccinations. Pt states she washed this with hydrogen peroxide and bleeding has been controlled since then.

## 2013-03-16 ENCOUNTER — Ambulatory Visit (INDEPENDENT_AMBULATORY_CARE_PROVIDER_SITE_OTHER): Payer: Medicare Other | Admitting: Family Medicine

## 2013-03-16 ENCOUNTER — Encounter: Payer: Self-pay | Admitting: Family Medicine

## 2013-03-16 VITALS — BP 138/82 | HR 76 | Temp 97.6°F | Ht 64.0 in | Wt 220.0 lb

## 2013-03-16 DIAGNOSIS — R05 Cough: Secondary | ICD-10-CM

## 2013-03-16 DIAGNOSIS — S41152A Open bite of left upper arm, initial encounter: Secondary | ICD-10-CM

## 2013-03-16 DIAGNOSIS — W540XXA Bitten by dog, initial encounter: Secondary | ICD-10-CM | POA: Diagnosis not present

## 2013-03-16 DIAGNOSIS — S41109A Unspecified open wound of unspecified upper arm, initial encounter: Secondary | ICD-10-CM

## 2013-03-16 DIAGNOSIS — Z23 Encounter for immunization: Secondary | ICD-10-CM | POA: Diagnosis not present

## 2013-03-16 DIAGNOSIS — R059 Cough, unspecified: Secondary | ICD-10-CM | POA: Diagnosis not present

## 2013-03-16 MED ORDER — AMOXICILLIN-POT CLAVULANATE 875-125 MG PO TABS: 1.0000 | ORAL_TABLET | Freq: Two times a day (BID) | ORAL | Status: DC

## 2013-03-16 MED ORDER — BENZONATATE 200 MG PO CAPS: 200.0000 mg | ORAL_CAPSULE | Freq: Three times a day (TID) | ORAL | Status: DC | PRN

## 2013-03-16 NOTE — Patient Instructions (Signed)
Take your antibiotics as directed to prevent/treat infection of dog bite. You were given a tetanus booster due to the dog bite.  Use the tessalon as needed for cough.  Return for re-evaluation if you develop fever, worsening cough, shortness of breath, or increased in redness, pain, swelling of arm related to dog bite.

## 2013-03-16 NOTE — ED Notes (Signed)
Pt called for triage 

## 2013-03-16 NOTE — ED Notes (Signed)
Pt called for triage x2 with no response.  

## 2013-03-16 NOTE — ED Notes (Signed)
Pt called for triage x3 with no response 

## 2013-03-16 NOTE — Progress Notes (Signed)
Chief Complaint  Patient presents with  . Cough    saw Vincenza Hews on 11/4 for cough, took abx and felt better-yesterday am cough came back.   . Animal Bite    her [place] shepard bite her last night. She had a coughing fit and dog was sleeping and scared him and hit bit her by accident.    She was treated with levaquin 500mg  x 7 days, rx'd 11/4 for bronchitis.  She got much better, cough had resolved until yesterday. Last dose was yesterday, and cough started coming back early in the day yesterday.  Cough is nonproductive.  Denies nasal congestion, sinus pain, sore throat or PND.  Feels somewhat tight/wheezy.  She had run out of the hycodan syrup (120cc) 2 nights ago, so instead took some OTC cough med, which didn't help.  She has been using her inhaler, but it doesn't seem to help that much. Denies any nasal drainage, PND, headache or sinus pain.  No fevers.  Dog bite was 9pm last night.  Her coughing at night woke up the dog, who startled, woke up and bit her on the left arm.  She cleaned the wounds well.  She went to the ER but after waiting for over an hour, she left.  She was not seen by doctor. It has increased in redness, swelling and pain.  No fevers.  Bite was from her own dog, who she reports is UTD on all shots including rabies.    Immunization History  Administered Date(s) Administered  . Influenza Split 02/01/2012  . Influenza Whole 03/16/2011  . Td 10/09/1997  . Tdap 08/02/2007  . Zoster 03/17/2011   Past Medical History  Diagnosis Date  . Hypothyroid   . Depression   . Impaired fasting glucose 5/07  . Obesity   . Vitamin D deficiency   . Diverticulosis    Past Surgical History  Procedure Laterality Date  . Thyroidectomy  1975  . Tonsillectomy    . Appendectomy    . Cesarean section    . Orif forearm fracture  2002  . Colonoscopy  02/04/2011    diverticulosis   History   Social History  . Marital Status: Divorced    Spouse Name: N/A    Number of Children: N/A   . Years of Education: N/A   Occupational History  . Not on file.   Social History Main Topics  . Smoking status: Former Smoker    Quit date: 05/05/1978  . Smokeless tobacco: Never Used  . Alcohol Use: Yes     Comment: occasionally, 2-3 times a week  . Drug Use: No  . Sexual Activity: Not Currently   Other Topics Concern  . Not on file   Social History Narrative  . No narrative on file    Current outpatient prescriptions:albuterol (PROVENTIL HFA;VENTOLIN HFA) 108 (90 BASE) MCG/ACT inhaler, Inhale 2 puffs into the lungs every 6 (six) hours as needed for wheezing or shortness of breath., Disp: 1 Inhaler, Rfl: 0;  aspirin 81 MG tablet, Take 81 mg by mouth daily.  , Disp: , Rfl: ;  buPROPion (WELLBUTRIN XL) 300 MG 24 hr tablet, Take 1 tablet (300 mg total) by mouth daily., Disp: 30 tablet, Rfl: 5 Cholecalciferol (VITAMIN D) 1000 UNITS capsule, Take 1,000 Units by mouth daily.  , Disp: , Rfl: ;  fish oil-omega-3 fatty acids 1000 MG capsule, Take 2 g by mouth daily.  , Disp: , Rfl: ;  FLUoxetine (PROZAC) 40 MG capsule, Take 1 capsule (  40 mg total) by mouth daily., Disp: 90 capsule, Rfl: 1;  Probiotic Product (PROBIOTIC PO), Take 1 capsule by mouth daily., Disp: , Rfl:  SYNTHROID 200 MCG tablet, Take 1 tablet (200 mcg total) by mouth daily., Disp: 30 tablet, Rfl: 11;  dicyclomine (BENTYL) 20 MG tablet, Take 1 tablet every 6 hours as needed for abdominal pain, Disp: 30 tablet, Rfl: 0;  Misc Natural Products (OSTEO BI-FLEX ADV JOINT SHIELD) TABS, Take 1 tablet by mouth daily., Disp: , Rfl:   No Known Allergies  ROS: Denies fevers, chills, nausea, vomiting, diarrhea.  No headache, dizziness, chest pain.  +cough, slight dyspnea.  No rashes, or other complaints except as per HPI  PHYSICAL EXAM: BP 138/82  Pulse 76  Temp(Src) 97.6 F (36.4 C) (Oral)  Ht 5\' 4"  (1.626 m)  Wt 220 lb (99.791 kg)  BMI 37.74 kg/m2  LMP 05/05/1990 Pleasant obese female in no distress, with intermittent dry, hacky  cough.  Left inner forearm:  There is a 17mm laceration surrounded by 4cm of erythema and some soft tissue swelling.  On the other side of the arm is 3mm puncture, surrounded by 3-4 cm of ecchymosis, very mild swelling.  No induration.  No streaks.  Heart: regular rate and rhythm with 1-2/6 murmur Lungs: clear.  Some cough when taking deep breath, but no wheezes, rales or ronchi noted. No wheeze with forced expiration  ASSESSMENT/PLAN:  Dog bite of arm, left, initial encounter - Plan: amoxicillin-clavulanate (AUGMENTIN) 875-125 MG per tablet, Tdap vaccine greater than or equal to 7yo IM  Cough - Plan: benzonatate (TESSALON) 200 MG capsule  Dog bite--Td (especially because >5 years and now has Medicare which will not pay for routine boosters, just associated with wounds).  Td was unavailable in clinic, so TdaP given in its place. Augmentin 875mg  BID x 10 days; continue probiotic.  Recent bronchitis--normal exam, other than dry hacky cough.  Cough recurred when levaquin was still in her system. Reassured that this is not likely pneumonia or other bacterial infection (and if it was, the augmentin will likely cover).  Return if increasing redness, drainage, pain, swelling, worsening cough, shortness of breath, fever or other concerns arise.

## 2013-05-09 ENCOUNTER — Encounter: Payer: Self-pay | Admitting: Family Medicine

## 2013-05-09 ENCOUNTER — Ambulatory Visit (INDEPENDENT_AMBULATORY_CARE_PROVIDER_SITE_OTHER): Payer: Medicare Other | Admitting: Family Medicine

## 2013-05-09 VITALS — BP 142/78 | HR 76 | Temp 98.0°F | Ht 64.0 in | Wt 216.0 lb

## 2013-05-09 DIAGNOSIS — M62830 Muscle spasm of back: Secondary | ICD-10-CM

## 2013-05-09 DIAGNOSIS — M5380 Other specified dorsopathies, site unspecified: Secondary | ICD-10-CM | POA: Diagnosis not present

## 2013-05-09 DIAGNOSIS — M549 Dorsalgia, unspecified: Secondary | ICD-10-CM

## 2013-05-09 DIAGNOSIS — R05 Cough: Secondary | ICD-10-CM | POA: Diagnosis not present

## 2013-05-09 DIAGNOSIS — R059 Cough, unspecified: Secondary | ICD-10-CM | POA: Diagnosis not present

## 2013-05-09 MED ORDER — CYCLOBENZAPRINE HCL 10 MG PO TABS: 10.0000 mg | ORAL_TABLET | Freq: Three times a day (TID) | ORAL | Status: DC | PRN

## 2013-05-09 NOTE — Patient Instructions (Addendum)
  Increase Aleve to 2 tablets twice daily with food. Use warm compresses, or soak in hot tub/shower/heating pad or [employer]macare while at work Do stretches 2-3 times daily as described Try mucinex, drinking plenty of fluids. Use the Tessalon as needed for cough.--this is the little pearly pill you already have from November.  You may use it three times a day, if needed Use flexeril at bedtime to help with muscle spasm (causes sedation).  Consider following up with chiropractor or getting PT for ongoing back pain, as this is muscular

## 2013-05-09 NOTE — Progress Notes (Signed)
Chief Complaint  Patient presents with  . Cough    has had this cough since last seen here 03/08/13, kind of comes and goes. Also has a pain in her mid back area on the right side that has been there x several weeks that has worsened has the last week.    She has been sick off and on since her visit here in early November.  Multiple sick contacts through this time.  She would get better for a few days here and there, but not recently.  Cough isn't as bad as it was--she was worse in earlier December, but she presents today due to now having back pain. Exposed to sick grandkids, her sick son (sick over Christmas)  Deep pain below her shoulder blade on the right--with position changes, bending.  She has worse pain with deep breaths.  Denies shortness of breath, denies feeling tight/wheezy.  She denies any fevers.  She has ongoing head congestion, mucus is clear or slightly discolored.  She denies sinus pain or headaches.  +PND.  Cough is productive of clear mucus, with slight greenish tint.  She denies fevers, chills.  No ear pain or sore throat.  She has taken Aleve (for her knee, and it helps some with her back pain).  She found a codeine pill at home recently, which helped with her cough.  She has been drinking brandy at night to help her sleep, helps with her cough  Past Medical History  Diagnosis Date  . Hypothyroid   . Depression   . Impaired fasting glucose 5/07  . Obesity   . Vitamin D deficiency   . Diverticulosis    Past Surgical History  Procedure Laterality Date  . Thyroidectomy  1975  . Tonsillectomy    . Appendectomy    . Cesarean section    . Orif forearm fracture  2002  . Colonoscopy  02/04/2011    diverticulosis   History   Social History  . Marital Status: Divorced    Spouse Name: N/A    Number of Children: N/A  . Years of Education: N/A   Occupational History  . Not on file.   Social History Main Topics  . Smoking status: Former Smoker    Quit date:  05/05/1978  . Smokeless tobacco: Never Used  . Alcohol Use: Yes     Comment: occasionally, 2-3 times a week  . Drug Use: No  . Sexual Activity: Not Currently   Other Topics Concern  . Not on file   Social History Narrative  . No narrative on file   Current outpatient prescriptions:aspirin 81 MG tablet, Take 81 mg by mouth daily.  , Disp: , Rfl: ;  buPROPion (WELLBUTRIN XL) 300 MG 24 hr tablet, Take 1 tablet (300 mg total) by mouth daily., Disp: 30 tablet, Rfl: 5;  Cholecalciferol (VITAMIN D) 1000 UNITS capsule, Take 1,000 Units by mouth daily.  , Disp: , Rfl: ;  fish oil-omega-3 fatty acids 1000 MG capsule, Take 2 g by mouth daily.  , Disp: , Rfl:  FLUoxetine (PROZAC) 40 MG capsule, Take 1 capsule (40 mg total) by mouth daily., Disp: 90 capsule, Rfl: 1;  Misc Natural Products (OSTEO BI-FLEX ADV JOINT SHIELD) TABS, Take 1 tablet by mouth daily., Disp: , Rfl: ;  Probiotic Product (PROBIOTIC PO), Take 1 capsule by mouth daily., Disp: , Rfl: ;  SYNTHROID 200 MCG tablet, Take 1 tablet (200 mcg total) by mouth daily., Disp: 30 tablet, Rfl: 11 albuterol (PROVENTIL HFA;VENTOLIN  HFA) 108 (90 BASE) MCG/ACT inhaler, Inhale 2 puffs into the lungs every 6 (six) hours as needed for wheezing or shortness of breath., Disp: 1 Inhaler, Rfl: 0;  cyclobenzaprine (FLEXERIL) 10 MG tablet, Take 1 tablet (10 mg total) by mouth 3 (three) times daily as needed for muscle spasms., Disp: 20 tablet, Rfl: 0  No Known Allergies  ROS:  Denies fevers,chills, headaches, dizziness, chest pain, palpitations, swelling, rashes, bleeding, bruising.  +knee pain--Injections into her right knee were helpful--plans to have another course next month.  +cough and back pain as per HPI.  Denies urinary complaints.  PHYSICAL EXAM: BP 142/78  Pulse 76  Temp(Src) 98 F (36.7 C) (Oral)  Ht 5\' 4"  (1.626 m)  Wt 216 lb (97.977 kg)  BMI 37.06 kg/m2  LMP 05/05/1990 Pleasant obese female in no acute distress. Speaking easily in full  sentences. HEENT:  PERRL, EOMI, conjunctiva clear.  Nasal mucosa mild-mod edematous, L>R, no erythema or purulence. Sinuses nontender.  TMs and EAC's normal. OP clear. Heart: regular rate and rhythm, 2/6 SEM at RUSB, unchanged. Lungs: clear bilaterally with good air movement.  No wheezes, rales, ronchi Back: Spine is nontender.  She has a focal area of tenderness to deep palpation at paraspinous muscle in lower thoracic region (right under her low-riding bra strap).  It is tender to palpation, with normal overlying skin.  Lungs are clear in this area. Skin: no rashes Psych: normal mood, affect Neuro: alert and oriented. Cranial nerves intact. Normal strength, gait.  ASSESSMENT/PLAN:  Muscle spasm of back - Plan: cyclobenzaprine (FLEXERIL) 10 MG tablet  Cough - no evidence of pneumonia or bacterial sinusitis.  +PND contributing  Back pain - R thoracic back, consistent with muscle spasm   Increase Aleve to 2 tablets twice daily with food. Use warm compresses, or soak in hot tub/shower/heating pad or [employer]macare while at work Do stretches 2-3 times daily as described Try mucinex, drinking plenty of fluids. Use the Tessalon as needed for cough. (she still has some left at home from last prescription) Use flexeril at bedtime to help with muscle spasm (causes sedation).  Risks/side effects of meds reviewed. Return if develops fever, shortness of breath, worsening cough, worsening pain, or other complaints.  Knee pain--improved. F/u with ortho as planned.  Continue weight loss

## 2013-06-08 ENCOUNTER — Other Ambulatory Visit (HOSPITAL_COMMUNITY)
Admission: RE | Admit: 2013-06-08 | Discharge: 2013-06-08 | Disposition: A | Discharge status: Home / Self Care | Payer: Medicare Other | Source: Ambulatory Visit | Attending: Family Medicine | Admitting: Family Medicine

## 2013-06-08 ENCOUNTER — Ambulatory Visit (INDEPENDENT_AMBULATORY_CARE_PROVIDER_SITE_OTHER): Payer: Medicare Other | Admitting: Family Medicine

## 2013-06-08 ENCOUNTER — Encounter: Payer: Self-pay | Admitting: Family Medicine

## 2013-06-08 VITALS — BP 140/88 | HR 72 | Ht 65.0 in | Wt 216.0 lb

## 2013-06-08 DIAGNOSIS — F325 Major depressive disorder, single episode, in full remission: Secondary | ICD-10-CM

## 2013-06-08 DIAGNOSIS — Z0000 Encounter for general adult medical examination without abnormal findings: Secondary | ICD-10-CM

## 2013-06-08 DIAGNOSIS — F329 Major depressive disorder, single episode, unspecified: Secondary | ICD-10-CM | POA: Diagnosis not present

## 2013-06-08 DIAGNOSIS — M62830 Muscle spasm of back: Secondary | ICD-10-CM

## 2013-06-08 DIAGNOSIS — Z124 Encounter for screening for malignant neoplasm of cervix: Secondary | ICD-10-CM | POA: Insufficient documentation

## 2013-06-08 DIAGNOSIS — E039 Hypothyroidism, unspecified: Secondary | ICD-10-CM

## 2013-06-08 DIAGNOSIS — E66813 Obesity, class 3: Secondary | ICD-10-CM

## 2013-06-08 DIAGNOSIS — R7301 Impaired fasting glucose: Secondary | ICD-10-CM | POA: Diagnosis not present

## 2013-06-08 DIAGNOSIS — E559 Vitamin D deficiency, unspecified: Secondary | ICD-10-CM | POA: Diagnosis not present

## 2013-06-08 DIAGNOSIS — Z23 Encounter for immunization: Secondary | ICD-10-CM

## 2013-06-08 DIAGNOSIS — Z13220 Encounter for screening for lipoid disorders: Secondary | ICD-10-CM | POA: Diagnosis not present

## 2013-06-08 DIAGNOSIS — M5380 Other specified dorsopathies, site unspecified: Secondary | ICD-10-CM | POA: Diagnosis not present

## 2013-06-08 DIAGNOSIS — D649 Anemia, unspecified: Secondary | ICD-10-CM | POA: Diagnosis not present

## 2013-06-08 DIAGNOSIS — Z1151 Encounter for screening for human papillomavirus (HPV): Secondary | ICD-10-CM | POA: Insufficient documentation

## 2013-06-08 DIAGNOSIS — Z01419 Encounter for gynecological examination (general) (routine) without abnormal findings: Secondary | ICD-10-CM

## 2013-06-08 DIAGNOSIS — F3289 Other specified depressive episodes: Secondary | ICD-10-CM | POA: Diagnosis not present

## 2013-06-08 LAB — COMPREHENSIVE METABOLIC PANEL
ALT: 12 U/L (ref 0–35)
AST: 13 U/L (ref 0–37)
Albumin: 3.8 g/dL (ref 3.5–5.2)
Alkaline Phosphatase: 112 U/L (ref 39–117)
BILIRUBIN TOTAL: 0.4 mg/dL (ref 0.2–1.2)
BUN: 9 mg/dL (ref 6–23)
CALCIUM: 9 mg/dL (ref 8.4–10.5)
CO2: 27 meq/L (ref 19–32)
Chloride: 102 mEq/L (ref 96–112)
Creat: 0.46 mg/dL — ABNORMAL LOW (ref 0.50–1.10)
GLUCOSE: 99 mg/dL (ref 70–99)
Potassium: 3.8 mEq/L (ref 3.5–5.3)
SODIUM: 136 meq/L (ref 135–145)
TOTAL PROTEIN: 6.8 g/dL (ref 6.0–8.3)

## 2013-06-08 LAB — CBC WITH DIFFERENTIAL/PLATELET
Basophils Absolute: 0 10*3/uL (ref 0.0–0.1)
Basophils Relative: 0 % (ref 0–1)
EOS PCT: 3 % (ref 0–5)
Eosinophils Absolute: 0.2 10*3/uL (ref 0.0–0.7)
HCT: 35.4 % — ABNORMAL LOW (ref 36.0–46.0)
Hemoglobin: 12.1 g/dL (ref 12.0–15.0)
LYMPHS ABS: 2 10*3/uL (ref 0.7–4.0)
LYMPHS PCT: 28 % (ref 12–46)
MCH: 27.1 pg (ref 26.0–34.0)
MCHC: 34.2 g/dL (ref 30.0–36.0)
MCV: 79.4 fL (ref 78.0–100.0)
Monocytes Absolute: 0.5 10*3/uL (ref 0.1–1.0)
Monocytes Relative: 7 % (ref 3–12)
NEUTROS ABS: 4.4 10*3/uL (ref 1.7–7.7)
NEUTROS PCT: 62 % (ref 43–77)
PLATELETS: 204 10*3/uL (ref 150–400)
RBC: 4.46 MIL/uL (ref 3.87–5.11)
RDW: 15.2 % (ref 11.5–15.5)
WBC: 7.1 10*3/uL (ref 4.0–10.5)

## 2013-06-08 LAB — POCT URINALYSIS DIPSTICK
Bilirubin, UA: NEGATIVE
Blood, UA: NEGATIVE
GLUCOSE UA: NEGATIVE
KETONES UA: NEGATIVE
Leukocytes, UA: NEGATIVE
Nitrite, UA: NEGATIVE
Protein, UA: NEGATIVE
SPEC GRAV UA: 1.01
UROBILINOGEN UA: NEGATIVE
pH, UA: 8

## 2013-06-08 LAB — LIPID PANEL
Cholesterol: 167 mg/dL (ref 0–200)
HDL: 50 mg/dL (ref >39)
LDL Cholesterol: 104 mg/dL — ABNORMAL HIGH (ref 0–99)
TRIGLYCERIDES: 66 mg/dL (ref <150)
Total CHOL/HDL Ratio: 3.3 Ratio
VLDL: 13 mg/dL (ref 0–40)

## 2013-06-08 LAB — HEMOGLOBIN A1C
HEMOGLOBIN A1C: 5.7 % — AB (ref <5.7)
MEAN PLASMA GLUCOSE: 117 mg/dL — AB (ref <117)

## 2013-06-08 MED ORDER — CYCLOBENZAPRINE HCL 10 MG PO TABS: 10.0000 mg | ORAL_TABLET | Freq: Three times a day (TID) | ORAL | Status: DC | PRN

## 2013-06-08 MED ORDER — FLUOXETINE HCL 40 MG PO CAPS: 40.0000 mg | ORAL_CAPSULE | Freq: Every day | ORAL | Status: DC

## 2013-06-08 NOTE — Patient Instructions (Signed)
  HEALTH MAINTENANCE RECOMMENDATIONS:  It is recommended that you get at least 30 minutes of aerobic exercise at least 5 days/week (for weight loss, you may need as much as 60-90 minutes). This can be any activity that gets your heart rate up. This can be divided in 10-15 minute intervals if needed, but try and build up your endurance at least once a week.  Weight bearing exercise is also recommended twice weekly.  Eat a healthy diet with lots of vegetables, fruits and fiber.  "Colorful" foods have a lot of vitamins (ie green vegetables, tomatoes, red peppers, etc).  Limit sweet tea, regular sodas and alcoholic beverages, all of which has a lot of calories and sugar.  Up to 1 alcoholic drink daily may be beneficial for women (unless trying to lose weight, watch sugars).  Drink a lot of water.  Calcium recommendations are 1200-1500 mg daily (1500 mg for postmenopausal women or women without ovaries), and vitamin D 1000 IU daily.  This should be obtained from diet and/or supplements (vitamins), and calcium should not be taken all at once, but in divided doses.  Monthly self breast exams and yearly mammograms for women over the age of 31 is recommended.  Sunscreen of at least SPF 30 should be used on all sun-exposed parts of the skin when outside between the hours of 10 am and 4 pm (not just when at beach or pool, but even with exercise, golf, tennis, and yard work!)  Use a sunscreen that says "broad spectrum" so it covers both UVA and UVB rays, and make sure to reapply every 1-2 hours.  Remember to change the batteries in your smoke detectors when changing your clock times in the spring and fall.  Use your seat belt every time you are in a car, and please drive safely and not be distracted with cell phones and texting while driving.   Please call [facility] to schedule your mammogram and bone density test (you were given the written prescription as an order)

## 2013-06-08 NOTE — Progress Notes (Signed)
Chief Complaint  Patient presents with  . welcome to medicare    fasting welcome to medicare visit, pt has no concerns or complaints   Katie Kaiser is a 68 y.o. female who presents for a complete physical/Welcome to Medicare.   She would like refill on flexeril.  Has problems with back pain when visiting her son in [place] on a different bed, playing with grandkids, and sometimes related to work.  Flexeril is helpful.  Currently not having pain.  Depression:  Is well controlled on her current regimen.  Just some intermittent problems sleeping. She has a friend who is very ill, dying, so has had more trouble sleeping recently.  She loves the addition of Wellbutrin, feels like her current regimen is working very well.  Other doctors caring for this patient include:  Ophtho: [place] (last seen in August 2014) Dentist: Dr. Golden Circle in [place] (hasn't seen in a year) Dermatologist: Dr. Allyson Sabal GI:  Dr. Carlean Purl Ortho: [facility]  End of Life Issues:  She does not have a Living Will or healthcare power of attorney. FAST questionnaire--see scanned form.  No problems with ADLs Depression questionnaire--see scanned form; sometimes has trouble with sleep, otherwise normal  Immunization History  Administered Date(s) Administered  . Influenza Split 02/01/2012, 01/03/2013  . Influenza Whole 03/16/2011  . Td 10/09/1997  . Tdap 08/02/2007, 03/16/2013  . Zoster 03/17/2011   Last Pap smear: 2012 Last mammogram: 08/2010 Last colonoscopy: 02/2011 Last DEXA: had lifeline screening a few years ago.  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   Past Medical History  Diagnosis Date  . Hypothyroid   . Depression   . Impaired fasting glucose 5/07  . Obesity   . Vitamin D deficiency   . Diverticulosis     Past Surgical History  Procedure Laterality Date  . Thyroidectomy   1975  . Tonsillectomy    . Appendectomy    . Cesarean section    . Orif forearm fracture  2002  . Colonoscopy  02/04/2011    diverticulosis    History   Social History  . Marital Status: Divorced    Spouse Name: N/A    Number of Children: 1  . Years of Education: N/A   Occupational History  . hairdresser    Social History Main Topics  . Smoking status: Former Smoker    Quit date: 05/05/1978  . Smokeless tobacco: Never Used  . Alcohol Use: Yes     Comment: occasionally, 2-3 times a week (a shot to help her sleep, occasionally)  . Drug Use: No  . Sexual Activity: Not Currently   Other Topics Concern  . Not on file   Social History Narrative   Lives alone, 4 dogs. Son lives in [place]    Family History  Problem Relation Age of Onset  . Parkinsonism Mother   . Diabetes Brother   . Heart disease Brother   . Cancer Neg Hx   . Colon cancer Neg Hx   . Stomach cancer Neg Hx   . HIV Brother   . Arthritis Sister   . Arthritis Sister    Outpatient Encounter Prescriptions as of 06/08/2013  Medication Sig  . aspirin 81 MG tablet Take 81 mg by mouth daily.    Marland Kitchen buPROPion (WELLBUTRIN XL) 300 MG 24 hr tablet Take 1 tablet (300 mg total) by mouth daily.  . Cholecalciferol (VITAMIN D)  1000 UNITS capsule Take 1,000 Units by mouth daily.    . fish oil-omega-3 fatty acids 1000 MG capsule Take 2 g by mouth daily.    Marland Kitchen FLUoxetine (PROZAC) 40 MG capsule Take 1 capsule (40 mg total) by mouth daily.  . Misc Natural Products (OSTEO BI-FLEX ADV JOINT SHIELD) TABS Take 1 tablet by mouth daily.  . Probiotic Product (PROBIOTIC PO) Take 1 capsule by mouth daily.  Marland Kitchen SYNTHROID 200 MCG tablet Take 1 tablet (200 mcg total) by mouth daily.  . [DISCONTINUED] FLUoxetine (PROZAC) 40 MG capsule Take 1 capsule (40 mg total) by mouth daily.  . cyclobenzaprine (FLEXERIL) 10 MG tablet Take 1 tablet (10 mg total) by mouth 3 (three) times daily as needed for muscle spasms.  . [DISCONTINUED] albuterol  (PROVENTIL HFA;VENTOLIN HFA) 108 (90 BASE) MCG/ACT inhaler Inhale 2 puffs into the lungs every 6 (six) hours as needed for wheezing or shortness of breath.  . [DISCONTINUED] cyclobenzaprine (FLEXERIL) 10 MG tablet Take 1 tablet (10 mg total) by mouth 3 (three) times daily as needed for muscle spasms.    No Known Allergies   ROS:  The patient denies anorexia, fever, headaches,  vision changes, decreased hearing, ear pain, sore throat, breast concerns, chest pain, palpitations, dizziness, syncope, dyspnea on exertion, cough, swelling, nausea, vomiting, diarrhea, constipation, abdominal pain, melena, hematochezia, indigestion/heartburn, hematuria, incontinence, dysuria, vaginal bleeding, discharge, odor or itch, genital lesions, numbness, tingling, weakness, tremor, suspicious skin lesions, abnormal bleeding/bruising, or enlarged lymph nodes. +intentional weight loss--has kept weight off from loss last year +right knee pain (getting injections from ortho). No other joint pains Depression is improved/controlled  PHYSICAL EXAM: BP 140/88  Pulse 72  Ht 5\' 5"  (1.651 m)  Wt 216 lb (97.977 kg)  BMI 35.94 kg/m2  LMP 05/05/1990 Stressed, constantly getting texts from brother of friend who is dying in hospital  General Appearance:    Alert, cooperative, no distress, appears stated age  Head:    Normocephalic, without obvious abnormality, atraumatic  Eyes:    PERRL, conjunctiva/corneas clear, EOM's intact, fundi    benign  Ears:    Normal TM's and external ear canals  Nose:   Nares normal, mucosa normal, no drainage or sinus   tenderness  Throat:   Lips, mucosa, and tongue normal; teeth and gums normal  Neck:   Supple, no lymphadenopathy;  thyroid:  no   enlargement/tenderness/nodules; no carotid   bruit or JVD  Back:    Spine nontender, no curvature, ROM normal, no CVA     tenderness  Lungs:     Clear to auscultation bilaterally without wheezes, rales or     ronchi; respirations unlabored  Chest  Wall:    No tenderness or deformity   Heart:    Regular rate and rhythm, S1 and S2 normal, no rub   or gallop. 2/6 SEM heard loudest at RUSB, unchanged  Breast Exam:    No tenderness, masses, or nipple discharge or inversion.      No axillary lymphadenopathy  Abdomen:     Soft, non-tender, nondistended, normoactive bowel sounds,    no masses, no hepatosplenomegaly  Genitalia:    Normal external genitalia without lesions.  BUS and vagina normal; cervix without lesions, or cervical motion tenderness. No abnormal vaginal discharge.  Uterus and adnexa not enlarged, nontender, no masses.  Pap performed  Rectal:    Normal tone, no masses or tenderness; guaiac negative stool  Extremities:   No clubbing, cyanosis or edema  Pulses:  2+ and symmetric all extremities  Skin:   Skin color, texture, turgor normal, no rashes or lesions  Lymph nodes:   Cervical, supraclavicular, and axillary nodes normal  Neurologic:   CNII-XII intact, normal strength, sensation and gait; reflexes 2+ and symmetric throughout          Psych:   Normal mood, affect, hygiene and grooming.     EKG:  NSR, rate 71.  Normal  ASSESSMENT/PLAN:  Welcome to Medicare preventive visit - Plan: POCT Urinalysis Dipstick, Visual acuity screening, EKG 12-Lead, Lipid panel, Comprehensive metabolic panel, CBC with Differential  Need for prophylactic vaccination against Streptococcus pneumoniae (pneumococcus) - Plan: Pneumococcal conjugate vaccine 13-valent  Depressive disorder, not elsewhere classified - Plan: FLUoxetine (PROZAC) 40 MG capsule  Muscle spasm of back - Plan: cyclobenzaprine (FLEXERIL) 10 MG tablet  Unspecified hypothyroidism - Plan: TSH  Obesity, Class III, BMI 40-49.9 (morbid obesity)  Unspecified vitamin D deficiency - Plan: Vit D  25 hydroxy (rtn osteoporosis monitoring)  Anemia - Plan: CBC with Differential  Screening for lipoid disorders - Plan: Lipid panel  Impaired fasting glucose - Plan: Comprehensive  metabolic panel, Hemoglobin A1c  Routine gynecological examination - Plan: Cytology - PAP [place]  Depression, major, in remission  Discussed monthly self breast exams and yearly mammograms after the age of 73; at least 30 minutes of aerobic activity at least 5 days/week; proper sunscreen use reviewed; healthy diet, including goals of calcium and vitamin D intake and alcohol recommendations (less than or equal to 1 drink/day) reviewed; regular seatbelt use; changing batteries in smoke detectors.  Immunization recommendations discussed--Prevnar today.  Colonoscopy recommendations reviewed, UTD  Written rx given to pt for DEXA through [facility] to schedule mammogram yearly  REFILL SYNTHROID AFTER LABS BACK

## 2013-06-09 LAB — TSH: TSH: 0.109 u[IU]/mL — AB (ref 0.350–4.500)

## 2013-06-09 LAB — VITAMIN D 25 HYDROXY (VIT D DEFICIENCY, FRACTURES): VIT D 25 HYDROXY: 49 ng/mL (ref 30–89)

## 2013-06-09 MED ORDER — SYNTHROID 200 MCG PO TABS: 200.0000 ug | ORAL_TABLET | Freq: Every day | ORAL | Status: DC

## 2013-06-09 MED ORDER — SYNTHROID 175 MCG PO TABS: ORAL_TABLET | ORAL | Status: DC

## 2013-06-09 NOTE — Addendum Note (Signed)
Addended by: Rita Ohara on: 06/09/2013 09:48 AM   Modules accepted: Orders

## 2013-06-10 ENCOUNTER — Telehealth: Payer: Self-pay | Admitting: Family Medicine

## 2013-06-10 MED ORDER — CEPHALEXIN 500 MG PO CAPS: 500.0000 mg | ORAL_CAPSULE | Freq: Three times a day (TID) | ORAL | Status: DC

## 2013-06-10 NOTE — Telephone Encounter (Signed)
Called pt and pt wants antibotic. Per shane send in keflex 500mg  #30 TID no refills

## 2013-06-10 NOTE — Telephone Encounter (Signed)
Pt can not lay on arm, hurts when lifting it, red swollen, and very sore  Per Audelia Acton. Pt is to take tynelol and it sounds like an reaction to the injection site. We can call in antibotic if pt is worried about it

## 2013-06-10 NOTE — Telephone Encounter (Signed)
Is she using any Tylenol?  If not use this q4-6 hours, OTC strength.  If she feels as though it is way worse, then come in and lets take a look, however, this is likely just an injection site reaction.  Any fever?

## 2013-06-13 ENCOUNTER — Telehealth: Payer: Self-pay | Admitting: Family Medicine

## 2013-06-13 NOTE — Telephone Encounter (Signed)
Injection reactions aren't uncommon to the pneumonia vaccine, often with redness, and sometimes with fever.  It can usually start pretty quickly, too soon for an infection to set in. Did she start taking the antibiotics?  If so, she must complete the full course. If she didn't, and redness isn't continuing to spread, and no further fevers, then she probably doesn't need to start.  Icing it, and using benadryl and/or zyrtec as needed are the recommendations to treat the reaction to the vaccine (NOT considered an allergy, just a local reaction to the medication that was injected).  Apologize for lack of timely response, and let her know my opinion.

## 2013-06-13 NOTE — Telephone Encounter (Signed)
Spoke with patient and went over all of Dr.Knapp's recommendations. Apologized tremendously to patient. She was okay by the time we hung up phone.

## 2013-06-13 NOTE — Telephone Encounter (Signed)
Patient called very upset that no one called her back on Friday and that at the end of day she called [place] back.  Pt said we had never let her down like this before.  That she was very concerned over her arm and 2 days after the pneumo shot her arm was still hurting with 3 inches of redness and still has a knot and fever and could not lay on the arm.  I apologized for the delay in [place] getting back to her.  And requested that in the future to call again within an hour or so if she does not get the response she needs for something urgent or to tell us she needs to come in.  As she was concerned as why we did not request to see her arm, it appears Audelia Acton had made that recommendation and Gabriel Cirri said at end of day when she talked with the pt, that she was going out of town next day.    Pt also wanted Dr. Tomi Bamberger to know about the arm redness and reaction to the shot.

## 2013-06-14 ENCOUNTER — Ambulatory Visit (INDEPENDENT_AMBULATORY_CARE_PROVIDER_SITE_OTHER): Payer: Medicare Other | Admitting: Family Medicine

## 2013-06-14 DIAGNOSIS — L0291 Cutaneous abscess, unspecified: Secondary | ICD-10-CM

## 2013-06-14 DIAGNOSIS — L0390 Cellulitis, unspecified: Secondary | ICD-10-CM

## 2013-06-14 NOTE — Progress Notes (Signed)
   Subjective:    Patient ID: Katie Kaiser, female    DOB: 1945/06/02, 68 y.o.   MRN: [id number]  HPI She was given a pneumonia shot last week and subsequently developed redness swelling and pain. She was placed on Keflex over the weekend and is here for followup. She says she is having less redness and swelling.   Review of Systems     Objective:   Physical Exam Them of the left arm does show a 2 cm area of induration and slight pain on palpation. There is surrounding erythema but it does seem to be reddish brown and is not warm.       Assessment & Plan:  Cellulitis  reassured her that it seems to be giving better. Recommend she take the entire dose of the antibiotic and call if further difficulty.

## 2013-07-04 DIAGNOSIS — M1710 Unilateral primary osteoarthritis, unspecified knee: Secondary | ICD-10-CM | POA: Diagnosis not present

## 2013-07-06 DIAGNOSIS — M1710 Unilateral primary osteoarthritis, unspecified knee: Secondary | ICD-10-CM | POA: Diagnosis not present

## 2013-07-13 DIAGNOSIS — M1710 Unilateral primary osteoarthritis, unspecified knee: Secondary | ICD-10-CM | POA: Diagnosis not present

## 2013-07-20 DIAGNOSIS — M1710 Unilateral primary osteoarthritis, unspecified knee: Secondary | ICD-10-CM | POA: Diagnosis not present

## 2013-07-21 ENCOUNTER — Other Ambulatory Visit: Payer: Medicare Other

## 2013-07-21 DIAGNOSIS — E039 Hypothyroidism, unspecified: Secondary | ICD-10-CM

## 2013-07-22 LAB — TSH: TSH: 3.497 u[IU]/mL (ref 0.350–4.500)

## 2013-07-25 ENCOUNTER — Other Ambulatory Visit: Payer: Self-pay | Admitting: *Deleted

## 2013-07-25 DIAGNOSIS — E039 Hypothyroidism, unspecified: Secondary | ICD-10-CM

## 2013-07-25 MED ORDER — SYNTHROID 175 MCG PO TABS: ORAL_TABLET | ORAL | Status: DC

## 2013-07-25 MED ORDER — SYNTHROID 200 MCG PO TABS: 200.0000 ug | ORAL_TABLET | Freq: Every day | ORAL | Status: DC

## 2013-08-10 ENCOUNTER — Telehealth: Payer: Self-pay | Admitting: Family Medicine

## 2013-08-10 ENCOUNTER — Other Ambulatory Visit: Payer: Self-pay | Admitting: Family Medicine

## 2013-08-10 DIAGNOSIS — M62830 Muscle spasm of back: Secondary | ICD-10-CM

## 2013-08-10 MED ORDER — CYCLOBENZAPRINE HCL 10 MG PO TABS: 10.0000 mg | ORAL_TABLET | Freq: Three times a day (TID) | ORAL | Status: DC | PRN

## 2013-08-10 NOTE — Telephone Encounter (Signed)
Per last visit: "Has problems with back pain when visiting her son in [place] on a different bed, playing with grandkids, and sometimes related to work."  She filled #20 2 mos ago.  Refill done

## 2013-08-10 NOTE — Telephone Encounter (Signed)
This was done at her visit for #20 in February.  Please check with pharmacy to see if she picked this up.

## 2013-08-10 NOTE — Telephone Encounter (Signed)
Spoke with pharmacy and she did pick up rx from 06/08/13 for #20 on 06/09/13. So I guess she is requesting a refill in that case.

## 2013-08-10 NOTE — Telephone Encounter (Signed)
Pt called and states she thought she was getting a refill also of her Flexerill.  She doesn't use it a lot but likes to have it as needed.  Please send to [name]

## 2013-09-07 DIAGNOSIS — M1710 Unilateral primary osteoarthritis, unspecified knee: Secondary | ICD-10-CM | POA: Diagnosis not present

## 2013-09-21 DIAGNOSIS — M1710 Unilateral primary osteoarthritis, unspecified knee: Secondary | ICD-10-CM | POA: Diagnosis not present

## 2013-09-28 DIAGNOSIS — M1710 Unilateral primary osteoarthritis, unspecified knee: Secondary | ICD-10-CM | POA: Diagnosis not present

## 2013-10-05 DIAGNOSIS — M1710 Unilateral primary osteoarthritis, unspecified knee: Secondary | ICD-10-CM | POA: Diagnosis not present

## 2013-11-23 ENCOUNTER — Other Ambulatory Visit: Payer: Self-pay | Admitting: Orthopaedic Surgery

## 2013-11-23 DIAGNOSIS — M19049 Primary osteoarthritis, unspecified hand: Secondary | ICD-10-CM | POA: Diagnosis not present

## 2013-11-23 DIAGNOSIS — M48061 Spinal stenosis, lumbar region without neurogenic claudication: Secondary | ICD-10-CM | POA: Diagnosis not present

## 2013-11-23 DIAGNOSIS — M545 Low back pain, unspecified: Secondary | ICD-10-CM

## 2013-11-23 DIAGNOSIS — M4120 Other idiopathic scoliosis, site unspecified: Secondary | ICD-10-CM | POA: Diagnosis not present

## 2013-11-25 ENCOUNTER — Ambulatory Visit
Admission: RE | Admit: 2013-11-25 | Discharge: 2013-11-25 | Disposition: A | Discharge status: Home / Self Care | Payer: Medicare Other | Source: Ambulatory Visit | Attending: Orthopaedic Surgery | Admitting: Orthopaedic Surgery

## 2013-11-25 DIAGNOSIS — M5126 Other intervertebral disc displacement, lumbar region: Secondary | ICD-10-CM | POA: Diagnosis not present

## 2013-11-25 DIAGNOSIS — M47817 Spondylosis without myelopathy or radiculopathy, lumbosacral region: Secondary | ICD-10-CM | POA: Diagnosis not present

## 2013-11-25 DIAGNOSIS — M545 Low back pain, unspecified: Secondary | ICD-10-CM

## 2013-11-25 DIAGNOSIS — M48061 Spinal stenosis, lumbar region without neurogenic claudication: Secondary | ICD-10-CM | POA: Diagnosis not present

## 2013-11-27 ENCOUNTER — Encounter (HOSPITAL_COMMUNITY): Payer: Self-pay | Admitting: Emergency Medicine

## 2013-11-27 ENCOUNTER — Emergency Department (HOSPITAL_COMMUNITY)
Admission: EM | Admit: 2013-11-27 | Discharge: 2013-11-27 | Discharge status: Against Medical Advice | Payer: Medicare Other | Attending: Emergency Medicine | Admitting: Emergency Medicine

## 2013-11-27 DIAGNOSIS — R52 Pain, unspecified: Secondary | ICD-10-CM | POA: Diagnosis not present

## 2013-11-27 DIAGNOSIS — Z5321 Procedure and treatment not carried out due to patient leaving prior to being seen by health care provider: Secondary | ICD-10-CM

## 2013-11-27 DIAGNOSIS — E669 Obesity, unspecified: Secondary | ICD-10-CM | POA: Insufficient documentation

## 2013-11-27 NOTE — ED Provider Notes (Signed)
Katie Kaiser is a 68 y.o. female left the emergency room without being seen after triage.   Monico Blitz, PA-C 11/27/13 1944

## 2013-11-27 NOTE — ED Notes (Signed)
Pt called to be placed in room, no response

## 2013-11-27 NOTE — ED Notes (Addendum)
Pt reports knee issues, went to orthopedic and got shots in her knees. Pt went to pcp for hand and back arthritis. Pt reports body aches, difficulty walking x2 weeks. Ambulatory. Pcp thinks pinched nerves from arthritis might be causing pain. Pt worried about lymes disease because she has had a few ticks on her. Pain 6/10. Pt reports she is just stiff. Reports she normally takes aleve but has not taken it today.

## 2013-11-27 NOTE — ED Notes (Signed)
rn called again, no response

## 2013-11-27 NOTE — ED Notes (Signed)
rn called for pt, no response, 2nd attempt

## 2013-11-29 NOTE — ED Provider Notes (Signed)
Medical screening examination/treatment/procedure(s) were performed by non-physician practitioner and as supervising physician I was immediately available for consultation/collaboration.   EKG Interpretation None      Rolland Porter, MD, Abram Sander   Janice Norrie, MD 11/29/13 [phone]

## 2013-11-30 ENCOUNTER — Other Ambulatory Visit: Payer: Self-pay

## 2013-11-30 DIAGNOSIS — M48061 Spinal stenosis, lumbar region without neurogenic claudication: Secondary | ICD-10-CM | POA: Diagnosis not present

## 2013-12-06 ENCOUNTER — Other Ambulatory Visit: Payer: Medicare Other

## 2013-12-06 DIAGNOSIS — M47817 Spondylosis without myelopathy or radiculopathy, lumbosacral region: Secondary | ICD-10-CM | POA: Diagnosis not present

## 2013-12-06 DIAGNOSIS — IMO0002 Reserved for concepts with insufficient information to code with codable children: Secondary | ICD-10-CM | POA: Diagnosis not present

## 2013-12-06 DIAGNOSIS — M48061 Spinal stenosis, lumbar region without neurogenic claudication: Secondary | ICD-10-CM | POA: Diagnosis not present

## 2013-12-08 ENCOUNTER — Ambulatory Visit (INDEPENDENT_AMBULATORY_CARE_PROVIDER_SITE_OTHER): Payer: Medicare Other | Admitting: Family Medicine

## 2013-12-08 ENCOUNTER — Encounter: Payer: Self-pay | Admitting: Family Medicine

## 2013-12-08 VITALS — BP 122/78 | HR 76 | Wt 226.0 lb

## 2013-12-08 DIAGNOSIS — B029 Zoster without complications: Secondary | ICD-10-CM

## 2013-12-08 DIAGNOSIS — E039 Hypothyroidism, unspecified: Secondary | ICD-10-CM

## 2013-12-08 MED ORDER — VALACYCLOVIR HCL 1 G PO TABS: 1000.0000 mg | ORAL_TABLET | Freq: Three times a day (TID) | ORAL | Status: DC

## 2013-12-08 NOTE — Progress Notes (Signed)
   Subjective:    Patient ID: Katie Kaiser, female    DOB: [birth year], 68 y.o.   MRN: [id number]  HPI She noted the onset of right-sided mid back pain approximately 3 days ago. She is concerned that this could be an insect bite. She also is here to have her thyroid rechecked. Review his record indicates a slightly low TSH. She is on a split dose regimen of thyroid.   Review of Systems     Objective:   Physical Exam 3 cm red fascicular lesion noted in the right T5 nerve root area. She is not hypersensitive in that nerve root area.       Assessment & Plan:  Shingles - Plan: valACYclovir (VALTREX) 1000 MG tablet  Unspecified hypothyroidism - Plan: TSH  I explained that even know she is not having other lesions or sensitivity, shingles is the most likely diagnosis.

## 2013-12-09 ENCOUNTER — Telehealth: Payer: Self-pay | Admitting: Family Medicine

## 2013-12-09 LAB — TSH: TSH: 1.062 u[IU]/mL (ref 0.350–4.500)

## 2013-12-09 NOTE — Telephone Encounter (Signed)
Pt called and requested a cream that she could use for shingles. Pt uses [facility] on [address]. Pt can be reached at [phone]

## 2013-12-09 NOTE — Telephone Encounter (Signed)
There is really no cream to be put on it but she can put a dressing on it if it's leaking

## 2013-12-09 NOTE — Telephone Encounter (Signed)
PATIENT SAID OKAY SHE WAS JUST WANTING IT TO DRY UP FASTER

## 2013-12-11 ENCOUNTER — Other Ambulatory Visit: Payer: Self-pay | Admitting: Family Medicine

## 2013-12-12 NOTE — Telephone Encounter (Signed)
Dr.Lalonde did her TSH last week when she was here and told her to stay on current dose. Should I send for 6 months or 1 year?

## 2013-12-12 NOTE — Telephone Encounter (Signed)
6 months.  I believe she is taking the 200 5d/wk, and 175 on Mon/Thurs, so she may need other dose refilled at some point also

## 2013-12-14 DIAGNOSIS — M48061 Spinal stenosis, lumbar region without neurogenic claudication: Secondary | ICD-10-CM | POA: Diagnosis not present

## 2013-12-14 DIAGNOSIS — IMO0002 Reserved for concepts with insufficient information to code with codable children: Secondary | ICD-10-CM | POA: Diagnosis not present

## 2013-12-14 DIAGNOSIS — M47817 Spondylosis without myelopathy or radiculopathy, lumbosacral region: Secondary | ICD-10-CM | POA: Diagnosis not present

## 2014-01-02 DIAGNOSIS — M47817 Spondylosis without myelopathy or radiculopathy, lumbosacral region: Secondary | ICD-10-CM | POA: Diagnosis not present

## 2014-01-02 DIAGNOSIS — IMO0002 Reserved for concepts with insufficient information to code with codable children: Secondary | ICD-10-CM | POA: Diagnosis not present

## 2014-01-02 DIAGNOSIS — M48061 Spinal stenosis, lumbar region without neurogenic claudication: Secondary | ICD-10-CM | POA: Diagnosis not present

## 2014-01-16 DIAGNOSIS — M47817 Spondylosis without myelopathy or radiculopathy, lumbosacral region: Secondary | ICD-10-CM | POA: Diagnosis not present

## 2014-01-16 DIAGNOSIS — IMO0002 Reserved for concepts with insufficient information to code with codable children: Secondary | ICD-10-CM | POA: Diagnosis not present

## 2014-01-16 DIAGNOSIS — M48061 Spinal stenosis, lumbar region without neurogenic claudication: Secondary | ICD-10-CM | POA: Diagnosis not present

## 2014-02-01 DIAGNOSIS — M1710 Unilateral primary osteoarthritis, unspecified knee: Secondary | ICD-10-CM | POA: Diagnosis not present

## 2014-02-08 DIAGNOSIS — M1711 Unilateral primary osteoarthritis, right knee: Secondary | ICD-10-CM | POA: Diagnosis not present

## 2014-02-15 DIAGNOSIS — M1711 Unilateral primary osteoarthritis, right knee: Secondary | ICD-10-CM | POA: Diagnosis not present

## 2014-02-23 DIAGNOSIS — M47817 Spondylosis without myelopathy or radiculopathy, lumbosacral region: Secondary | ICD-10-CM | POA: Diagnosis not present

## 2014-02-23 DIAGNOSIS — M4806 Spinal stenosis, lumbar region: Secondary | ICD-10-CM | POA: Diagnosis not present

## 2014-02-23 DIAGNOSIS — M5116 Intervertebral disc disorders with radiculopathy, lumbar region: Secondary | ICD-10-CM | POA: Diagnosis not present

## 2014-03-03 DIAGNOSIS — Z23 Encounter for immunization: Secondary | ICD-10-CM | POA: Diagnosis not present

## 2014-03-04 ENCOUNTER — Encounter: Payer: Self-pay | Admitting: Internal Medicine

## 2014-03-06 ENCOUNTER — Encounter: Payer: Self-pay | Admitting: Family Medicine

## 2014-03-07 ENCOUNTER — Telehealth: Payer: Self-pay | Admitting: Family Medicine

## 2014-03-07 NOTE — Telephone Encounter (Signed)
Pt was given a pnuemonia shot at [facility] on Friday and started having a bad reaction from the injection overnight at which time pt and pharmacy realized that pt had the vaccine in February and did not need it anyway. Pt was having flu-like symptoms, fever, chills, arm swollen & red at injection site. Per pharmacist, reaction that pt was having was typical for someone that had double vaccine. Reaction symptioms have started to subside now however pharmacy is going to check in on pt and advise her to take Ibuprofen or Acetaminophen. If Dr Tomi Bamberger wants to advise something different, please contact pt

## 2014-03-07 NOTE — Telephone Encounter (Signed)
Pt had Prevnar-13 in February.  It is recommended to get Pneumovax 6-12 months later.  Please check with patient which type of pneumonia vaccine she got, and abstract it (date was Friday).  It is very common to get localized reactions to pneumonia vaccines (both types).  If she did in fact get Prevnar again, she still will need pneumovax at some point (next year), and would possibly have a greater reaction.  I agree with pharmacist recommendation.  Can also try ice pack and antihistamine if needed (ie benadryl or zyrtec)

## 2014-03-07 NOTE — Telephone Encounter (Signed)
Patient said that the worst is over for her she was sleeping in gloves because she was so cold. She did not know if shot was 13 or 23 she said pharmacy was suppose to fax over what shot she was given. She said she was calling the pharmacy now

## 2014-03-08 ENCOUNTER — Encounter: Payer: Self-pay | Admitting: Internal Medicine

## 2014-03-14 DIAGNOSIS — I8312 Varicose veins of left lower extremity with inflammation: Secondary | ICD-10-CM | POA: Diagnosis not present

## 2014-03-14 DIAGNOSIS — I83893 Varicose veins of bilateral lower extremities with other complications: Secondary | ICD-10-CM | POA: Diagnosis not present

## 2014-03-14 DIAGNOSIS — I8311 Varicose veins of right lower extremity with inflammation: Secondary | ICD-10-CM | POA: Diagnosis not present

## 2014-03-15 ENCOUNTER — Other Ambulatory Visit: Payer: Self-pay | Admitting: Family Medicine

## 2014-04-05 DIAGNOSIS — M1711 Unilateral primary osteoarthritis, right knee: Secondary | ICD-10-CM | POA: Diagnosis not present

## 2014-04-11 ENCOUNTER — Encounter (HOSPITAL_COMMUNITY): Payer: Self-pay | Admitting: Emergency Medicine

## 2014-04-11 ENCOUNTER — Emergency Department (HOSPITAL_COMMUNITY)
Admission: EM | Admit: 2014-04-11 | Discharge: 2014-04-11 | Disposition: A | Discharge status: Home / Self Care | Payer: Medicare Other | Attending: Emergency Medicine | Admitting: Emergency Medicine

## 2014-04-11 ENCOUNTER — Emergency Department (HOSPITAL_COMMUNITY): Payer: Medicare Other

## 2014-04-11 DIAGNOSIS — Z7982 Long term (current) use of aspirin: Secondary | ICD-10-CM | POA: Diagnosis not present

## 2014-04-11 DIAGNOSIS — R0602 Shortness of breath: Secondary | ICD-10-CM | POA: Diagnosis not present

## 2014-04-11 DIAGNOSIS — Z79899 Other long term (current) drug therapy: Secondary | ICD-10-CM | POA: Insufficient documentation

## 2014-04-11 DIAGNOSIS — E559 Vitamin D deficiency, unspecified: Secondary | ICD-10-CM | POA: Insufficient documentation

## 2014-04-11 DIAGNOSIS — E669 Obesity, unspecified: Secondary | ICD-10-CM | POA: Diagnosis not present

## 2014-04-11 DIAGNOSIS — E039 Hypothyroidism, unspecified: Secondary | ICD-10-CM | POA: Diagnosis not present

## 2014-04-11 DIAGNOSIS — R011 Cardiac murmur, unspecified: Secondary | ICD-10-CM | POA: Diagnosis not present

## 2014-04-11 DIAGNOSIS — R55 Syncope and collapse: Secondary | ICD-10-CM

## 2014-04-11 DIAGNOSIS — F329 Major depressive disorder, single episode, unspecified: Secondary | ICD-10-CM | POA: Diagnosis not present

## 2014-04-11 DIAGNOSIS — R002 Palpitations: Secondary | ICD-10-CM | POA: Diagnosis not present

## 2014-04-11 DIAGNOSIS — R079 Chest pain, unspecified: Secondary | ICD-10-CM

## 2014-04-11 DIAGNOSIS — Z87891 Personal history of nicotine dependence: Secondary | ICD-10-CM | POA: Insufficient documentation

## 2014-04-11 DIAGNOSIS — R110 Nausea: Secondary | ICD-10-CM | POA: Insufficient documentation

## 2014-04-11 DIAGNOSIS — Z8719 Personal history of other diseases of the digestive system: Secondary | ICD-10-CM | POA: Insufficient documentation

## 2014-04-11 DIAGNOSIS — R0789 Other chest pain: Secondary | ICD-10-CM | POA: Insufficient documentation

## 2014-04-11 LAB — BASIC METABOLIC PANEL
ANION GAP: 14 (ref 5–15)
BUN: 10 mg/dL (ref 6–23)
CO2: 24 meq/L (ref 19–32)
CREATININE: 0.56 mg/dL (ref 0.50–1.10)
Calcium: 9.3 mg/dL (ref 8.4–10.5)
Chloride: 101 mEq/L (ref 96–112)
GFR calc Af Amer: >90 mL/min (ref >90)
GFR calc non Af Amer: >90 mL/min (ref >90)
Glucose, Bld: 98 mg/dL (ref 70–99)
Potassium: 3.9 mEq/L (ref 3.7–5.3)
Sodium: 139 mEq/L (ref 137–147)

## 2014-04-11 LAB — CBC
HEMATOCRIT: 36.3 % (ref 36.0–46.0)
Hemoglobin: 12.1 g/dL (ref 12.0–15.0)
MCH: 26.7 pg (ref 26.0–34.0)
MCHC: 33.3 g/dL (ref 30.0–36.0)
MCV: 80.1 fL (ref 78.0–100.0)
Platelets: 193 10*3/uL (ref 150–400)
RBC: 4.53 MIL/uL (ref 3.87–5.11)
RDW: 14 % (ref 11.5–15.5)
WBC: 7.3 10*3/uL (ref 4.0–10.5)

## 2014-04-11 LAB — I-STAT TROPONIN, ED: Troponin i, poc: 0 ng/mL (ref 0.00–0.08)

## 2014-04-11 NOTE — ED Notes (Signed)
Pt reports she was standing up at work and she had a "swoosh" come over her chest and pain to L side of chest. Pt sts she felt like she was going to pass out and had some sob. Pt reports similar episode 3 weeks ago but she didn't think much of it. Denies pain at this time.

## 2014-04-11 NOTE — Discharge Instructions (Signed)
Chest Pain (Nonspecific) °It is often hard to give a specific diagnosis for the cause of chest pain. There is always a chance that your pain could be related to something serious, such as a heart attack or a blood clot in the lungs. You need to follow up with your health care provider for further evaluation. °CAUSES  °· Heartburn. °· Pneumonia or bronchitis. °· Anxiety or stress. °· Inflammation around your heart (pericarditis) or lung (pleuritis or pleurisy). °· A blood clot in the lung. °· A collapsed lung (pneumothorax). It can develop suddenly on its own (spontaneous pneumothorax) or from trauma to the chest. °· Shingles infection (herpes zoster virus). °The chest wall is composed of bones, muscles, and cartilage. Any of these can be the source of the pain. °· The bones can be bruised by injury. °· The muscles or cartilage can be strained by coughing or overwork. °· The cartilage can be affected by inflammation and become sore (costochondritis). °DIAGNOSIS  °Lab tests or other studies may be needed to find the cause of your pain. Your health care provider may have you take a test called an ambulatory electrocardiogram (ECG). An ECG records your heartbeat patterns over a 24-hour period. You may also have other tests, such as: °· Transthoracic echocardiogram (TTE). During echocardiography, sound waves are used to evaluate how blood flows through your heart. °· Transesophageal echocardiogram (TEE). °· Cardiac monitoring. This allows your health care provider to monitor your heart rate and rhythm in real time. °· Holter monitor. This is a portable device that records your heartbeat and can help diagnose heart arrhythmias. It allows your health care provider to track your heart activity for several days, if needed. °· Stress tests by exercise or by giving medicine that makes the heart beat faster. °TREATMENT  °· Treatment depends on what may be causing your chest pain. Treatment may include: °¨ Acid blockers for  heartburn. °¨ Anti-inflammatory medicine. °¨ Pain medicine for inflammatory conditions. °¨ Antibiotics if an infection is present. °· You may be advised to change lifestyle habits. This includes stopping smoking and avoiding alcohol, caffeine, and chocolate. °· You may be advised to keep your head raised (elevated) when sleeping. This reduces the chance of acid going backward from your stomach into your esophagus. °Most of the time, nonspecific chest pain will improve within 2-3 days with rest and mild pain medicine.  °HOME CARE INSTRUCTIONS  °· If antibiotics were prescribed, take them as directed. Finish them even if you start to feel better. °· For the next few days, avoid physical activities that bring on chest pain. Continue physical activities as directed. °· Do not use any tobacco products, including cigarettes, chewing tobacco, or electronic cigarettes. °· Avoid drinking alcohol. °· Only take medicine as directed by your health care provider. °· Follow your health care provider's suggestions for further testing if your chest pain does not go away. °· Keep any follow-up appointments you made. If you do not go to an appointment, you could develop lasting (chronic) problems with pain. If there is any problem keeping an appointment, call to reschedule. °SEEK MEDICAL CARE IF:  °· Your chest pain does not go away, even after treatment. °· You have a rash with blisters on your chest. °· You have a fever. °SEEK IMMEDIATE MEDICAL CARE IF:  °· You have increased chest pain or pain that spreads to your arm, neck, jaw, back, or abdomen. °· You have shortness of breath. °· You have an increasing cough, or you cough   up blood.  You have severe back or abdominal pain.  You feel nauseous or vomit.  You have severe weakness.  You faint.  You have chills. This is an emergency. Do not wait to see if the pain will go away. Get medical help at once. Call your local emergency services (911 in [place] Do not drive  yourself to the hospital. MAKE SURE YOU:   Understand these instructions.  Will watch your condition.  Will get help right away if you are not doing well or get worse. Document Released: 01/29/2005 Document Revised: 04/26/2013 Document Reviewed: 11/25/2007 [facility] Patient Information 2015 [place], [place]. This information is not intended to replace advice given to you by your health care provider. Make sure you discuss any questions you have with your health care provider.  Cardiac Event Monitoring A cardiac event monitor is a small recording device used to help detect abnormal heart rhythms (arrhythmias). The monitor is used to record heart rhythm when noticeable symptoms such as the following occur:  Fast heartbeats (palpitations), such as heart racing or fluttering.  Dizziness.  Fainting or light-headedness.  Unexplained weakness. The monitor is wired to two electrodes placed on your chest. Electrodes are flat, sticky disks that attach to your skin. The monitor can be worn for up to 30 days. You will wear the monitor at all times, except when bathing.  HOW TO USE YOUR CARDIAC EVENT MONITOR A technician will prepare your chest for the electrode placement. The technician will show you how to place the electrodes, how to work the monitor, and how to replace the batteries. Take time to practice using the monitor before you leave the office. Make sure you understand how to send the information from the monitor to your health care provider. This requires a telephone with a landline, not a cell phone. You need to:  Wear your monitor at all times, except when you are in water:  Do not get the monitor wet.  Take the monitor off when bathing. Do not swim or use a hot tub with it on.  Keep your skin clean. Do not put body lotion or moisturizer on your chest.  Change the electrodes daily or any time they stop sticking to your skin. You might need to use tape to keep them on.  It is  possible that your skin under the electrodes could become irritated. To keep this from happening, try to put the electrodes in slightly different places on your chest. However, they must remain in the area under your left breast and in the upper right section of your chest.  Make sure the monitor is safely clipped to your clothing or in a location close to your body that your health care provider recommends.  Press the button to record when you feel symptoms of heart trouble, such as dizziness, weakness, light-headedness, palpitations, thumping, shortness of breath, unexplained weakness, or a fluttering or racing heart. The monitor is always on and records what happened slightly before you pressed the button, so do not worry about being too late to get good information.  Keep a diary of your activities, such as walking, doing chores, and taking medicine. It is especially important to note what you were doing when you pushed the button to record your symptoms. This will help your health care provider determine what might be contributing to your symptoms. The information stored in your monitor will be reviewed by your health care provider alongside your diary entries.  Send the recorded information as  recommended by your health care provider. It is important to understand that it will take some time for your health care provider to process the results.  Change the batteries as recommended by your health care provider. SEEK IMMEDIATE MEDICAL CARE IF:   You have chest pain.  You have extreme difficulty breathing or shortness of breath.  You develop a very fast heartbeat that persists.  You develop dizziness that does not go away.  You faint or constantly feel you are about to faint. Document Released: 01/29/2008 Document Revised: 09/05/2013 Document Reviewed: 10/18/2012 [facility] Patient Information 2015 [place], [place]. This information is not intended to replace advice given to you by your health  care provider. Make sure you discuss any questions you have with your health care provider.

## 2014-04-11 NOTE — ED Provider Notes (Signed)
  Face-to-face evaluation   History: Transient chest pain, which lasted 10 minutes, and was associated with a sensation of near syncope.  She describes the pain both as a "swoosh, and pressure".  She has similar episode 3 months ago.  She has a history of cardiac murmur, but no other chronic history.  Family history positive for coronary artery disease in brother.  Physical exam: Alert, calm, cooperative, in no apparent distress.  Heart regular rate and rhythm, no murmur.  Lungs clear to auscultation.  Abdomen soft, nontender.   Assessment: Atypical chest pain, with low risk profile.  Stable for discharge with outpatient evaluation by her PCP.  Evaluation, with both 2-D cardiac echo, and cardiac stress test, is likely to be necessary.  Medical screening examination/treatment/procedure(s) were conducted as a shared visit with non-physician practitioner(s) and myself.  I personally evaluated the patient during the encounter   Richarda Blade, MD 04/12/14 1038

## 2014-04-11 NOTE — ED Provider Notes (Signed)
CSN: [id number]     Arrival date & time 04/11/14  1208 History   First MD Initiated Contact with Patient 04/11/14 1236     Chief Complaint  Patient presents with  . Chest Pain     (Consider location/radiation/quality/duration/timing/severity/associated sxs/prior Treatment) HPI Pt is a 68yo female with hx of hypothyroidism, depression, impaired fasting glucose, and vitamin D deficiency presenting to ED with concern for chest pain with associated with near syncope sensation. 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 Pt states she felt like she was about to pass out and became very frightened.  Episode lasted about 10 seconds. Reports hx of similar episode about 3 weeks ago but that time was less intense so she did not seek medical attention at that time.  Denies previous hx of CAD. No hx of DM or HTN. States when she sees her PCP she has been told she has a slight murmur but has never been referred to cardiologist.  Denies chest pain or SOB at this time. States she is feeling almost back to baseline.   Past Medical History  Diagnosis Date  . Hypothyroid   . Depression   . Impaired fasting glucose 5/07  . Obesity   . Vitamin D deficiency   . Diverticulosis    Past Surgical History  Procedure Laterality Date  . Thyroidectomy  1975  . Tonsillectomy    . Appendectomy    . Cesarean section    . Orif forearm fracture  2002  . Colonoscopy  02/04/2011    diverticulosis   Family History  Problem Relation Age of Onset  . Parkinsonism Mother   . Diabetes Brother   . Heart disease Brother   . Cancer Neg Hx   . Colon cancer Neg Hx   . Stomach cancer Neg Hx   . HIV Brother   . Arthritis Sister   . Arthritis Sister    History  Substance Use Topics  . Smoking status: Former Smoker    Quit date: 05/05/1978  .  Smokeless tobacco: Never Used  . Alcohol Use: Yes     Comment: occasionally, 2-3 times a week (a shot to help her sleep, occasionally)   OB History    Gravida Para Term Preterm AB TAB SAB Ectopic Multiple Living   1 1 1       1      Review of Systems  Constitutional: Negative for fever, chills and fatigue.  Respiratory: Positive for shortness of breath. Negative for cough.   Cardiovascular: Positive for chest pain and palpitations. Negative for leg swelling.       "whooshing" sensation   Gastrointestinal: Positive for nausea. Negative for vomiting, diarrhea and constipation.  All other systems reviewed and are negative.     Allergies  Review of patient's allergies indicates no known allergies.  Home Medications   Prior to Admission medications   Medication Sig Start Date End Date Taking? Authorizing Provider  aspirin 81 MG tablet Take 81 mg by mouth daily.     Yes Historical Provider, MD  buPROPion (WELLBUTRIN XL) 300 MG 24 hr tablet TAKE 1 TABLET BY MOUTH DAILY 03/15/14  Yes Rita Ohara, MD  Cholecalciferol (VITAMIN D) 1000 UNITS capsule Take 1,000 Units by mouth daily.     Yes Historical Provider, MD  fish  oil-omega-3 fatty acids 1000 MG capsule Take 2 g by mouth daily.     Yes Historical Provider, MD  FLUoxetine (PROZAC) 40 MG capsule Take 1 capsule (40 mg total) by mouth daily. 06/08/13  Yes Rita Ohara, MD  levothyroxine (SYNTHROID, LEVOTHROID) 200 MCG tablet Take 200 mcg by mouth daily before breakfast.   Yes Historical Provider, MD  cyclobenzaprine (FLEXERIL) 10 MG tablet Take 1 tablet (10 mg total) by mouth 3 (three) times daily as needed for muscle spasms. Patient not taking: Reported on 04/11/2014 08/10/13   Rita Ohara, MD  SYNTHROID 200 MCG tablet TAKE 1 TABLET (200 MCG TOTAL) BY MOUTH DAILY. TAKE 1 TABLET DAILY AS DIRECTED (CURRENTLY 5 DAYS/WEEK) Patient not taking: Reported on 04/11/2014    Rita Ohara, MD  valACYclovir (VALTREX) 1000 MG tablet Take 1 tablet (1,000 mg total) by  mouth 3 (three) times daily. Patient not taking: Reported on 04/11/2014 12/08/13   Denita Lung, MD   BP 161/82 mmHg  Pulse 87  Temp(Src) 98.3 F (36.8 C) (Oral)  Resp 20  Ht 5\' 5"  (1.651 m)  Wt 220 lb (99.791 kg)  BMI 36.61 kg/m2  SpO2 100%  LMP 05/05/1990 Physical Exam  Constitutional: She is oriented to person, place, and time. She appears well-developed and well-nourished. No distress.  Pt lying comfortably in exam bed, NAD.   HENT:  Head: Normocephalic and atraumatic.  Eyes: Conjunctivae are normal. No scleral icterus.  Neck: Normal range of motion. Neck supple.  Cardiovascular: Normal rate and regular rhythm.   Murmur heard. Pulmonary/Chest: Effort normal and breath sounds normal. No respiratory distress. She has no wheezes. She has no rales. She exhibits no tenderness.  Abdominal: Soft. Bowel sounds are normal. She exhibits no distension and no mass. There is no tenderness. There is no rebound and no guarding.  Musculoskeletal: Normal range of motion.  Neurological: She is alert and oriented to person, place, and time. No cranial nerve deficit. Coordination normal.  Skin: Skin is warm and dry. She is not diaphoretic.  Nursing note and vitals reviewed.   ED Course  Procedures (including critical care time) Labs Review Labs Reviewed  BASIC METABOLIC PANEL  CBC  I-STAT [place], ED    Imaging Review Dg Chest 2 View  04/11/2014   CLINICAL DATA:  Chest pain  EXAM: CHEST  2 VIEW  COMPARISON:  By 07/2011  FINDINGS: Heart size is normal. Vascularity normal. Lungs are clear without infiltrate or effusion.  Prominent right hilum on the prior study has resolved. Questioned density in the lingula also has resolved.  IMPRESSION: No active cardiopulmonary disease.   Electronically Signed   By: Franchot Gallo M.D.   On: 04/11/2014 13:41     EKG Interpretation   Date/Time:  Tuesday April 11 2014 12:14:19 EST Ventricular Rate:  85 PR Interval:  144 QRS Duration: 90 QT  Interval:  372 QTC Calculation: 442 R Axis:   44 Text Interpretation:  Normal sinus rhythm Normal ECG since last tracing no  significant change Confirmed by Eulis Foster  MD, Vira Agar [id number]) on 04/11/2014  1:34:41 PM      MDM   Final diagnoses:  Chest pain, unspecified chest pain type  Shortness of breath  Near syncope    Pt is a 68yo female presenting to ED with c/o atypical chest pain associated with a near syncope episode that lasted about 10 seconds today, hx of similar but less intense episode 3 weeks ago. Symptoms resolved by the time pt arrived in ED.  Pt is low risk for major cardiac event per HEART score. On exam, pt appears well, NAD. Vitals: NAD.  Heart: regular rate and rhythm, however, murmur noted.  Hx of murmur.   Cardiac workup- negative in ED with negative troponin and ECG: normal.  Discussed pt with Dr. Eulis Foster who also examined pt. Pt is hemodynamically stable and may be discharged home, but strongly encouraged to f/u with her PCP Dr. Rita Ohara to schedule cardiac stress test and echocardiogram for further evaluation of symptoms and murmur. Pt education on near syncope, non-specific chest pain provided. Return precautions provided. Pt verbalized understanding and agreement with tx plan.     Noland Fordyce, PA-C 04/11/14 [place], MD 04/12/14 1039

## 2014-04-12 ENCOUNTER — Inpatient Hospital Stay: Payer: Medicare Other | Admitting: Family Medicine

## 2014-04-12 DIAGNOSIS — M1711 Unilateral primary osteoarthritis, right knee: Secondary | ICD-10-CM | POA: Diagnosis not present

## 2014-04-12 DIAGNOSIS — I8312 Varicose veins of left lower extremity with inflammation: Secondary | ICD-10-CM | POA: Diagnosis not present

## 2014-04-12 DIAGNOSIS — I8311 Varicose veins of right lower extremity with inflammation: Secondary | ICD-10-CM | POA: Diagnosis not present

## 2014-04-12 DIAGNOSIS — I83893 Varicose veins of bilateral lower extremities with other complications: Secondary | ICD-10-CM | POA: Diagnosis not present

## 2014-04-12 DIAGNOSIS — I83813 Varicose veins of bilateral lower extremities with pain: Secondary | ICD-10-CM | POA: Diagnosis not present

## 2014-04-13 ENCOUNTER — Inpatient Hospital Stay: Payer: Medicare Other | Admitting: Family Medicine

## 2014-04-17 DIAGNOSIS — I83893 Varicose veins of bilateral lower extremities with other complications: Secondary | ICD-10-CM | POA: Diagnosis not present

## 2014-04-17 DIAGNOSIS — I8311 Varicose veins of right lower extremity with inflammation: Secondary | ICD-10-CM | POA: Diagnosis not present

## 2014-04-17 DIAGNOSIS — I8312 Varicose veins of left lower extremity with inflammation: Secondary | ICD-10-CM | POA: Diagnosis not present

## 2014-04-17 DIAGNOSIS — I83813 Varicose veins of bilateral lower extremities with pain: Secondary | ICD-10-CM | POA: Diagnosis not present

## 2014-04-19 ENCOUNTER — Inpatient Hospital Stay: Payer: Medicare Other | Admitting: Family Medicine

## 2014-04-19 DIAGNOSIS — M1711 Unilateral primary osteoarthritis, right knee: Secondary | ICD-10-CM | POA: Diagnosis not present

## 2014-06-16 ENCOUNTER — Other Ambulatory Visit: Payer: Self-pay | Admitting: Family Medicine

## 2014-06-19 ENCOUNTER — Telehealth: Payer: Self-pay | Admitting: Internal Medicine

## 2014-06-19 NOTE — Telephone Encounter (Signed)
Spoke with patient and went over all of Dr.Knapp's recommendations, patient verbalized understanding. She will call tomorrow if not better.

## 2014-06-19 NOTE — Telephone Encounter (Signed)
Pt states that she has stomach pain and diarrhea and some vomiting since Saturday. She has tired Iodium but has not helped any and has been drinking on ginger ale. She wants to know what else she can do. Pt is in bed with no energy and can't come in to be seen. Please call and advise pt

## 2014-06-19 NOTE — Telephone Encounter (Signed)
Advise pt--bland diet, mostly clears (ginger ale fine, can have chicken broth).  Avoid ALL dairy. She may continue to use either imodium or pepto bismol to help with diarrhea.  If she is having dry mouth, very little urine output, these can be signs of dehydration and ER visit for IV fluids will be needed.  Otherwise, if ongoing abdominal pain, needs OV here.  Definitely needs eval if high fever, blood in emesis or stool, worsening abdominal pain.  If this is a GI bug, expect it to improve in the next day or so, and to continue with BRAT diet and avoiding dairy.

## 2014-06-21 ENCOUNTER — Telehealth: Payer: Self-pay | Admitting: *Deleted

## 2014-06-21 NOTE — Telephone Encounter (Signed)
Patient called and she is still having diarrhea. Has taken imodium and pepto without any relief. Has been drinking pedialyte as to not become dehydrated. Offered her an appt for tomorrow, she states that she really needs something today as she is suffering. States that she has had lomotil in the past and that has helped. She would like to know of you can call that in for her today, if you can and still want to see her she would be glad to come in but needs relief now. Thanks.

## 2014-06-21 NOTE — Telephone Encounter (Signed)
She needs OV.  Lomotil is a controlled substance, needs eval.  It is concerning that other meds haven't helped control it at all. (we could have seen her earlier today had she called earlier, as instructed)

## 2014-06-22 ENCOUNTER — Ambulatory Visit (INDEPENDENT_AMBULATORY_CARE_PROVIDER_SITE_OTHER): Payer: Medicare Other | Admitting: Family Medicine

## 2014-06-22 ENCOUNTER — Encounter: Payer: Self-pay | Admitting: Family Medicine

## 2014-06-22 ENCOUNTER — Ambulatory Visit: Payer: Medicare Other | Admitting: Family Medicine

## 2014-06-22 VITALS — BP 140/82 | HR 70 | Wt 224.0 lb

## 2014-06-22 DIAGNOSIS — K529 Noninfective gastroenteritis and colitis, unspecified: Secondary | ICD-10-CM

## 2014-06-22 NOTE — Patient Instructions (Signed)
Eat whatever you're comfortable eating. Plenty of fluids You can take as many as 8 Imodium per day Use any probiotic you want

## 2014-06-22 NOTE — Telephone Encounter (Signed)
Patient informed-she will call if she needs to be seen.

## 2014-06-22 NOTE — Progress Notes (Signed)
   Subjective:    Patient ID: Katie Kaiser, female    DOB: 06-06-1945, 69 y.o.   MRN: [id number]  HPI She complains of a six-day history that started with malaise followed by vomiting and diarrhea. The vomiting only occurred once. Since then she has continued had difficulty with diarrhea but no fever, chills but is having some cramping. She has been using 4 Imodium per day without much success. She has been on a soft relatively clear diet. Today the diarrhea seems to have slowed down. She is on a probiotic   Review of Systems     Objective:   Physical Exam Alert and in no distress. Tympanic membranes and canals are normal. Pharyngeal area is normal. Neck is supple without adenopathy or thyromegaly. Cardiac exam shows a regular sinus rhythm without murmurs or gallops. Lungs are clear to auscultation. Abdominal exam shows active bowel sounds without masses or tenderness        Assessment & Plan:  Acute gastroenteritis  recommend she continue with probiotic. Also recommend Imodium up to 8 tablets per day.

## 2014-06-23 ENCOUNTER — Other Ambulatory Visit: Payer: Self-pay | Admitting: Family Medicine

## 2014-06-23 ENCOUNTER — Ambulatory Visit: Payer: Medicare Other | Admitting: Family Medicine

## 2014-07-07 ENCOUNTER — Ambulatory Visit (INDEPENDENT_AMBULATORY_CARE_PROVIDER_SITE_OTHER): Payer: Medicare Other | Admitting: Family Medicine

## 2014-07-07 ENCOUNTER — Encounter: Payer: Self-pay | Admitting: Family Medicine

## 2014-07-07 VITALS — BP 150/80 | HR 79 | Temp 98.6°F | Wt 224.0 lb

## 2014-07-07 DIAGNOSIS — J069 Acute upper respiratory infection, unspecified: Secondary | ICD-10-CM

## 2014-07-07 DIAGNOSIS — B9789 Other viral agents as the cause of diseases classified elsewhere: Secondary | ICD-10-CM

## 2014-07-07 DIAGNOSIS — J029 Acute pharyngitis, unspecified: Secondary | ICD-10-CM | POA: Diagnosis not present

## 2014-07-07 LAB — POCT RAPID STREP A (OFFICE): Rapid Strep A Screen: NEGATIVE

## 2014-07-07 NOTE — Progress Notes (Signed)
   Subjective:    Patient ID: Katie Kaiser, female    DOB: 02-25-1946, 69 y.o.   MRN: [id number]  HPI She complains of a 4 day history of bad sore throat, earache, cough, chest congestion and malaise. No fever, chills   Review of Systems     Objective:   Physical Exam Alert and in no distress. Tympanic membranes and canals are normal. Pharyngeal area is normal. Neck is supple without adenopathy or thyromegaly. Cardiac exam shows a regular sinus rhythm without murmurs or gallops. Lungs are clear to auscultation. Strep screen negative       Assessment & Plan:  Acute pharyngitis, unspecified pharyngitis type - Plan: Rapid Strep A  Viral URI with cough  supportive care including NyQuil as well as Afrin but only at night.

## 2014-07-07 NOTE — Patient Instructions (Signed)
NyQuil to help with the coughing at night and also Afrin nasal spray so you can breathe through your nose night. Don't use Afrin during the day

## 2014-07-26 ENCOUNTER — Telehealth: Payer: Self-pay | Admitting: Family Medicine

## 2014-07-26 MED ORDER — AZITHROMYCIN 500 MG PO TABS: 500.0000 mg | ORAL_TABLET | Freq: Every day | ORAL | Status: DC

## 2014-07-26 NOTE — Telephone Encounter (Signed)
Please call  She was here few weeks ago, sore throat, cough, ear and jaw pain   symptoms went away but now symptoms are back

## 2014-07-26 NOTE — Telephone Encounter (Signed)
Pt called back & stated both ears hurting, she is miserable, sore throat, fever.  Asked for something to be called in for her today

## 2014-07-26 NOTE — Telephone Encounter (Signed)
Tell her that I called an antibiotic in

## 2014-07-26 NOTE — Telephone Encounter (Signed)
lm

## 2014-07-29 ENCOUNTER — Encounter (HOSPITAL_COMMUNITY): Payer: Self-pay | Admitting: Nurse Practitioner

## 2014-07-29 ENCOUNTER — Emergency Department (HOSPITAL_COMMUNITY)
Admission: EM | Admit: 2014-07-29 | Discharge: 2014-07-29 | Disposition: A | Discharge status: Home / Self Care | Payer: Medicare Other | Attending: Emergency Medicine | Admitting: Emergency Medicine

## 2014-07-29 DIAGNOSIS — J029 Acute pharyngitis, unspecified: Secondary | ICD-10-CM | POA: Diagnosis present

## 2014-07-29 DIAGNOSIS — E559 Vitamin D deficiency, unspecified: Secondary | ICD-10-CM | POA: Insufficient documentation

## 2014-07-29 DIAGNOSIS — E039 Hypothyroidism, unspecified: Secondary | ICD-10-CM | POA: Insufficient documentation

## 2014-07-29 DIAGNOSIS — Z79899 Other long term (current) drug therapy: Secondary | ICD-10-CM | POA: Diagnosis not present

## 2014-07-29 DIAGNOSIS — Z87891 Personal history of nicotine dependence: Secondary | ICD-10-CM | POA: Diagnosis not present

## 2014-07-29 DIAGNOSIS — Z7982 Long term (current) use of aspirin: Secondary | ICD-10-CM | POA: Diagnosis not present

## 2014-07-29 DIAGNOSIS — Z8719 Personal history of other diseases of the digestive system: Secondary | ICD-10-CM | POA: Diagnosis not present

## 2014-07-29 DIAGNOSIS — J020 Streptococcal pharyngitis: Secondary | ICD-10-CM | POA: Insufficient documentation

## 2014-07-29 DIAGNOSIS — E669 Obesity, unspecified: Secondary | ICD-10-CM | POA: Insufficient documentation

## 2014-07-29 LAB — RAPID STREP SCREEN (MED CTR MEBANE ONLY): Streptococcus, Group A Screen (Direct): POSITIVE — AB

## 2014-07-29 MED ORDER — PENICILLIN G BENZATHINE 1200000 UNIT/2ML IM SUSP
1.2000 10*6.[IU] | Freq: Once | INTRAMUSCULAR | Status: AC
  Administered 2014-07-29: 1.2 10*6.[IU] via INTRAMUSCULAR
  Filled 2014-07-29: qty 2

## 2014-07-29 MED ORDER — HYDROCODONE-ACETAMINOPHEN 7.5-325 MG/15ML PO SOLN: 15.0000 mL | Freq: Three times a day (TID) | ORAL | Status: DC | PRN

## 2014-07-29 NOTE — Discharge Instructions (Signed)

## 2014-07-29 NOTE — ED Notes (Signed)
PT C/O SORE THRoat, congestion, allergy symptoms onset 2 weeks ago. Her PCP prescribed her azithromycin for persistent symptoms this week. She finished the azithromycin yesterday and continues to havE SEVERE pain in mouth and throat, bilateral ear pain, dry cough. She also took aleve with no relief. 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

## 2014-07-29 NOTE — ED Provider Notes (Signed)
CSN: [id number]     Arrival date & time 07/29/14  1502 History  This chart was scribed for Montine Circle, PA-C working with Nat Christen, MD by Randa Evens, ED Scribe. This patient was seen in room TR06C/TR06C and the patient's care was started at 4:15 PM.    Chief Complaint  Patient presents with  . Sore Throat   Patient is a 69 y.o. female presenting with pharyngitis. The history is provided by the patient. No language interpreter was used.  Sore Throat   HPI Comments: LESLEE SUIRE is a 69 y.o. female who presents to the Emergency Department complaining of worsening sore throat onset 2 weeks prior. Pt state she has associated congestion, ear pain and cough. Pt states that her mouth is painful is well. Pt states that her symptoms are worse when swallowing due to the pain. Pt states that this morning her mouth was really dry. Pt states she recently went to her PCP for similar symptoms and was prescribed azithromycin that hasn't provided any relief. Pt states she was only prescribed 3 pills. Pt states she has recently been around sick contacts. Pt denies fever, chills or other related symptoms.   Past Medical History  Diagnosis Date  . Hypothyroid   . Depression   . Impaired fasting glucose 5/07  . Obesity   . Vitamin D deficiency   . Diverticulosis    Past Surgical History  Procedure Laterality Date  . Thyroidectomy  1975  . Tonsillectomy    . Appendectomy    . Cesarean section    . Orif forearm fracture  2002  . Colonoscopy  02/04/2011    diverticulosis   Family History  Problem Relation Age of Onset  . Parkinsonism Mother   . Diabetes Brother   . Heart disease Brother   . Cancer Neg Hx   . Colon cancer Neg Hx   . Stomach cancer Neg Hx   . HIV Brother   . Arthritis Sister   . Arthritis Sister    History  Substance Use Topics  . Smoking status: Former Smoker    Quit date: 05/05/1978  . Smokeless tobacco: Never Used  . Alcohol Use: Yes     Comment:  occasionally, 2-3 times a week (a shot to help her sleep, occasionally)   OB History    Gravida Para Term Preterm AB TAB SAB Ectopic Multiple Living   1 1 1       1       Review of Systems  Constitutional: Negative for fever and chills.  HENT: Positive for congestion, ear pain and sore throat.   Respiratory: Positive for cough.   All other systems reviewed and are negative.   Allergies  Review of patient's allergies indicates no known allergies.  Home Medications   Prior to Admission medications   Medication Sig Start Date End Date Taking? Authorizing Provider  aspirin 81 MG tablet Take 81 mg by mouth daily.      Historical Provider, MD  azithromycin (ZITHROMAX) 500 MG tablet Take 1 tablet (500 mg total) by mouth daily. 07/26/14   Denita Lung, MD  buPROPion (WELLBUTRIN XL) 300 MG 24 hr tablet TAKE 1 TABLET BY MOUTH DAILY 06/26/14   Rita Ohara, MD  Cholecalciferol (VITAMIN D) 1000 UNITS capsule Take 1,000 Units by mouth daily.      Historical Provider, MD  cyclobenzaprine (FLEXERIL) 10 MG tablet Take 1 tablet (10 mg total) by mouth 3 (three) times daily as needed for muscle spasms.  08/10/13   Rita Ohara, MD  fish oil-omega-3 fatty acids 1000 MG capsule Take 2 g by mouth daily.      Historical Provider, MD  FLUoxetine (PROZAC) 40 MG capsule Take 1 capsule (40 mg total) by mouth daily. 06/08/13   Rita Ohara, MD  Probiotic Product (PROBIOTIC DAILY PO) Take by mouth.    Historical Provider, MD  SYNTHROID 200 MCG tablet TAKE 1 TABLET (200 MCG TOTAL) BY MOUTH DAILY. TAKE 1 TABLET DAILY AS DIRECTED (CURRENTLY 5 DAYS/WEEK) 06/16/14   Rita Ohara, MD   BP 162/73 mmHg  Pulse 93  Temp(Src) 98.2 F (36.8 C)  Resp 18  Ht 5' 5.5" (1.664 m)  Wt 220 lb (99.791 kg)  BMI 36.04 kg/m2  SpO2 97%  LMP 05/05/1990   Physical Exam  Constitutional: She is oriented to person, place, and time. She appears well-developed and well-nourished. No distress.  HENT:  Head: Normocephalic and atraumatic.  Oropharynx  is erythematous, no exudates, no abscess  Eyes: Conjunctivae and EOM are normal.  Neck: Neck supple. No tracheal deviation present.  Cardiovascular: Normal rate.   Pulmonary/Chest: Effort normal. No respiratory distress.  Musculoskeletal: Normal range of motion.  Lymphadenopathy:    She has no cervical adenopathy.  Neurological: She is alert and oriented to person, place, and time.  Skin: Skin is warm and dry.  Psychiatric: She has a normal mood and affect. Her behavior is normal.  Nursing note and vitals reviewed.   ED Course  Procedures (including critical care time) DIAGNOSTIC STUDIES: Oxygen Saturation is 97% on RA, normal by my interpretation.    COORDINATION OF CARE: 4:23 PM-Discussed treatment plan with pt at bedside and pt agreed to plan.     Labs Review Labs Reviewed  RAPID STREP SCREEN - Abnormal; Notable for the following:    Streptococcus, Group A Screen (Direct) POSITIVE (*)    All other components within normal limits    Imaging Review No results found.   EKG Interpretation None      MDM   Final diagnoses:  Strep throat    Patient with strep throat.  No abscess on exam.  Will treat with pen IM.  Encouraged hydration.  Hycet for pain control.  Patient is stable and ready for discharge.  I personally performed the services described in this documentation, which was scribed in my presence. The recorded information has been reviewed and is accurate.       Montine Circle, PA-C 07/29/14 1632  Nat Christen, MD 07/29/14 [phone]

## 2014-07-31 ENCOUNTER — Telehealth: Payer: Self-pay | Admitting: Family Medicine

## 2014-07-31 NOTE — Telephone Encounter (Signed)
Pt called & wanted to let you know that she ended up having to go to the ER because her throat got so bad and she had strep

## 2014-08-07 ENCOUNTER — Encounter: Payer: Self-pay | Admitting: Family Medicine

## 2014-08-21 ENCOUNTER — Ambulatory Visit (INDEPENDENT_AMBULATORY_CARE_PROVIDER_SITE_OTHER): Payer: Medicare Other | Admitting: Family Medicine

## 2014-08-21 ENCOUNTER — Encounter: Payer: Self-pay | Admitting: Family Medicine

## 2014-08-21 VITALS — BP 140/80 | HR 64 | Temp 98.2°F | Ht 63.5 in | Wt 229.6 lb

## 2014-08-21 DIAGNOSIS — E559 Vitamin D deficiency, unspecified: Secondary | ICD-10-CM

## 2014-08-21 DIAGNOSIS — E66813 Obesity, class 3: Secondary | ICD-10-CM

## 2014-08-21 DIAGNOSIS — Z5181 Encounter for therapeutic drug level monitoring: Secondary | ICD-10-CM

## 2014-08-21 DIAGNOSIS — Z7189 Other specified counseling: Secondary | ICD-10-CM | POA: Diagnosis not present

## 2014-08-21 DIAGNOSIS — R6884 Jaw pain: Secondary | ICD-10-CM

## 2014-08-21 DIAGNOSIS — M79602 Pain in left arm: Secondary | ICD-10-CM

## 2014-08-21 DIAGNOSIS — M62830 Muscle spasm of back: Secondary | ICD-10-CM

## 2014-08-21 DIAGNOSIS — F324 Major depressive disorder, single episode, in partial remission: Secondary | ICD-10-CM

## 2014-08-21 DIAGNOSIS — M79609 Pain in unspecified limb: Secondary | ICD-10-CM | POA: Diagnosis not present

## 2014-08-21 DIAGNOSIS — F325 Major depressive disorder, single episode, in full remission: Secondary | ICD-10-CM

## 2014-08-21 DIAGNOSIS — R7301 Impaired fasting glucose: Secondary | ICD-10-CM

## 2014-08-21 DIAGNOSIS — E039 Hypothyroidism, unspecified: Secondary | ICD-10-CM | POA: Diagnosis not present

## 2014-08-21 DIAGNOSIS — Z01419 Encounter for gynecological examination (general) (routine) without abnormal findings: Secondary | ICD-10-CM

## 2014-08-21 DIAGNOSIS — Z0000 Encounter for general adult medical examination without abnormal findings: Secondary | ICD-10-CM | POA: Diagnosis not present

## 2014-08-21 LAB — CBC WITH DIFFERENTIAL/PLATELET
Basophils Absolute: 0 10*3/uL (ref 0.0–0.1)
Basophils Relative: 0 % (ref 0–1)
Eosinophils Absolute: 0.3 10*3/uL (ref 0.0–0.7)
Eosinophils Relative: 4 % (ref 0–5)
HCT: 37.9 % (ref 36.0–46.0)
Hemoglobin: 12.3 g/dL (ref 12.0–15.0)
LYMPHS PCT: 28 % (ref 12–46)
Lymphs Abs: 1.8 10*3/uL (ref 0.7–4.0)
MCH: 26.3 pg (ref 26.0–34.0)
MCHC: 32.5 g/dL (ref 30.0–36.0)
MCV: 81 fL (ref 78.0–100.0)
MONO ABS: 0.3 10*3/uL (ref 0.1–1.0)
MONOS PCT: 5 % (ref 3–12)
MPV: 8.8 fL (ref 8.6–12.4)
Neutro Abs: 4 10*3/uL (ref 1.7–7.7)
Neutrophils Relative %: 63 % (ref 43–77)
Platelets: 238 10*3/uL (ref 150–400)
RBC: 4.68 MIL/uL (ref 3.87–5.11)
RDW: 16.3 % — ABNORMAL HIGH (ref 11.5–15.5)
WBC: 6.3 10*3/uL (ref 4.0–10.5)

## 2014-08-21 LAB — COMPREHENSIVE METABOLIC PANEL
ALT: 16 U/L (ref 0–35)
AST: 17 U/L (ref 0–37)
Albumin: 3.9 g/dL (ref 3.5–5.2)
Alkaline Phosphatase: 98 U/L (ref 39–117)
BILIRUBIN TOTAL: 0.4 mg/dL (ref 0.2–1.2)
BUN: 15 mg/dL (ref 6–23)
CO2: 24 mEq/L (ref 19–32)
Calcium: 8.8 mg/dL (ref 8.4–10.5)
Chloride: 104 mEq/L (ref 96–112)
Creat: 0.56 mg/dL (ref 0.50–1.10)
Glucose, Bld: 87 mg/dL (ref 70–99)
POTASSIUM: 4.3 meq/L (ref 3.5–5.3)
SODIUM: 137 meq/L (ref 135–145)
TOTAL PROTEIN: 6.7 g/dL (ref 6.0–8.3)

## 2014-08-21 LAB — TSH: TSH: 5.361 u[IU]/mL — ABNORMAL HIGH (ref 0.350–4.500)

## 2014-08-21 MED ORDER — FLUOXETINE HCL 40 MG PO CAPS: 40.0000 mg | ORAL_CAPSULE | Freq: Every day | ORAL | Status: DC

## 2014-08-21 MED ORDER — CYCLOBENZAPRINE HCL 10 MG PO TABS: 10.0000 mg | ORAL_TABLET | Freq: Three times a day (TID) | ORAL | Status: DC | PRN

## 2014-08-21 MED ORDER — BUPROPION HCL ER (XL) 300 MG PO TB24: 300.0000 mg | ORAL_TABLET | Freq: Every day | ORAL | Status: DC

## 2014-08-21 NOTE — Patient Instructions (Signed)
  HEALTH MAINTENANCE RECOMMENDATIONS:  It is recommended that you get at least 30 minutes of aerobic exercise at least 5 days/week (for weight loss, you may need as much as 60-90 minutes). This can be any activity that gets your heart rate up. This can be divided in 10-15 minute intervals if needed, but try and build up your endurance at least once a week.  Weight bearing exercise is also recommended twice weekly.  Eat a healthy diet with lots of vegetables, fruits and fiber.  "Colorful" foods have a lot of vitamins (ie green vegetables, tomatoes, red peppers, etc).  Limit sweet tea, regular sodas and alcoholic beverages, all of which has a lot of calories and sugar.  Up to 1 alcoholic drink daily may be beneficial for women (unless trying to lose weight, watch sugars).  Drink a lot of water.  Calcium recommendations are 1200-1500 mg daily (1500 mg for postmenopausal women or women without ovaries), and vitamin D 1000 IU daily.  This should be obtained from diet and/or supplements (vitamins), and calcium should not be taken all at once, but in divided doses.  Monthly self breast exams and yearly mammograms for women over the age of 8 is recommended.  Sunscreen of at least SPF 30 should be used on all sun-exposed parts of the skin when outside between the hours of 10 am and 4 pm (not just when at beach or pool, but even with exercise, golf, tennis, and yard work!)  Use a sunscreen that says "broad spectrum" so it covers both UVA and UVB rays, and make sure to reapply every 1-2 hours.  Remember to change the batteries in your smoke detectors when changing your clock times in the spring and fall.  Use your seat belt every time you are in a car, and please drive safely and not be distracted with cell phones and texting while driving.   Please call [facility] and schedule both mammogram and bone density test. You are due for the dentist--discuss jaw pain with him. I'm happy to send you for x-ray and  physical therapy if worsening.  X-ray of your forearm is recommended if having worsening pain--you can discuss this with your ortho if you prefer.  Try and get back to the gym for the exercise bike/elliptical. Cut back on carbs and portion sizes.

## 2014-08-21 NOTE — Progress Notes (Signed)
Chief Complaint  Patient presents with  . Med check plus    fasting med check plus with pelvic. The left side of her face has been bothering her for about a month. Left arm has plate and 12 pins x 27-51 years-it has been causing her pain and is sore and hot to the touch.    Katie Kaiser is a 69 y.o. female who presents for annual wellness visit and follow-up on chronic medical conditions.  She has the following concerns:  Left jaw has been bothering her, it is hard to open. Some popping/clicking.  Has been hurting for a couple of months.  It got worse around the time she had strep last month. She doesn't believe she clenches her teeth or grinds her teeth.  Doesn't chew gum.  Currently describes it as a soreness.  Left arm has been hurting; she has a "herniated muscle" and sometimes feels a bigger knot.  Pain comes and goes.  Currently has a sore spot on her ulnar bone.  She states she has 2 plates and 12 pins. She reports she had an x-ray 4-5 years ago at ortho.  Pain had resolved, but recently recurred. Denies any new injury. X-rays from ortho are not available for my review. MRI in 06/2006: IMPRESSION: 1. Fracture of the base of the first metacarpal, with healing response noted.  2. 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  3. There is a small tear of the triangular fiber cartilage near the radial attachment site. 4. Low level edema signal medially in the hamate may be due to the adjacent fracture at the base of the fifth metacarpal or due to the initial trauma. No overt fracture of the hamate is identified.  Hypothyroidism:  She is currently taking 227mg daily.  Review of chart shows that 1 year ago her dose was increased from 200 4x/wk to 5x/wk (and 175 on the other days).  Recheck 12/2013 was normal (she was seen by Dr. LRedmond School.  She can't recall what dose she was taking at that time, but reports she has been taking 2066m daily for the last 6 months or more. She has gained 10# in the last year. Denies any cold intolerance, hair/skin/bowel changes.  She thinks the weight gain is due to being less active from knee pain.  Depression: Is well controlled on her current regimen. Just some intermittent problems sleeping, recurring recently.  Requesting refill of flexeril. Has problems with back pain when visiting her [place] in [place] a different bed, playing with grandkids, and sometimes related to work. Flexeril is helpful. Currently not having pain.  Immunization History  Administered Date(s) Administered  . Influenza Split 02/01/2012, 01/03/2013, 03/03/2014, 03/03/2014  . Influenza Whole 03/16/2011  . Pneumococcal Conjugate-13 06/08/2013  . Pneumococcal Polysaccharide-23 03/03/2014  . Td 10/09/1997  . Tdap 08/02/2007, 03/16/2013  . Zoster 03/17/2011   Last Pap smear: 06/2013, no high risk HPV detected Last mammogram: 08/2010 (never went last year as recommended) Last colonoscopy: 02/2011 Last DEXA: had lifeline screening a few years ago. Never had full DEXA. 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 Gym member but hasn't been going.   Other doctors caring for patient include: Ophtho:Dr.  Scott at [facility] (last seen in August 2014) Dentist: Dr. Golden Circle in [place]  Dermatologist: Dr. Allyson Sabal GI: Dr. Andrey Campanile: [facility] (sees Dr. Rudene Anda PA, Biagio Borg)   Depression screen:  See scanned questionnaire.  Notable for trouble sleeping and some fatigue ADL screen:  See scanned questionnaire.  Notable only for some stress incontinence with coughing. No falls in the last year  End of Life Discussion:   Patient does not have a living will and medical power of attorney. She was given paperwork last year, but admits to not looking at it.  Past Medical History  Diagnosis Date  . Hypothyroid   . Depression   . Impaired fasting glucose 5/07  . Obesity   . Vitamin D deficiency   . Diverticulosis     Past Surgical History  Procedure Laterality Date  . Thyroidectomy  1975  . Tonsillectomy    . Appendectomy    . Cesarean section    . Orif forearm fracture  2002  . Colonoscopy  02/04/2011    diverticulosis    History   Social History  . Marital Status: Divorced    Spouse Name: N/A  . Number of Children: 1  . Years of Education: N/A   Occupational History  . hairdresser    Social History Main Topics  . Smoking status: Former Smoker    Quit date: 05/05/1978  . Smokeless tobacco: Never Used  . Alcohol Use: 0.0 oz/week    0 Standard drinks or equivalent per week     Comment: a glass of wine or beer once or twice a month  . Drug Use: No  . Sexual Activity: Not Currently   Other Topics Concern  . Not on file   Social History Narrative   Lives alone, 4 dogs. Son lives in [place], [place] granddaughters, third on the way    Family History  Problem Relation Age of Onset  . Parkinsonism Mother   . Diabetes Brother   . Heart disease Brother   . Cancer Neg Hx   . Colon cancer Neg Hx   . Stomach cancer Neg Hx   . HIV Brother   . Arthritis Sister   . Arthritis Sister     Outpatient Encounter Prescriptions as of 08/21/2014  Medication Sig Note  . aspirin 81 MG tablet Take 81 mg by mouth daily.     Marland Kitchen buPROPion (WELLBUTRIN XL) 300 MG 24 hr tablet Take 1 tablet (300 mg total) by mouth daily.   . Cholecalciferol (VITAMIN D) 1000 UNITS capsule Take 1,000 Units by mouth daily.     . fish oil-omega-3 fatty acids 1000 MG capsule Take 2 g by mouth daily.     Marland Kitchen FLUoxetine (PROZAC) 40 MG capsule Take 1 capsule (40 mg total) by mouth daily.   . naproxen sodium (ANAPROX) 220 MG tablet  Take 440 mg by mouth 2 (two) times daily with a meal.   . Probiotic Product (PROBIOTIC DAILY PO) Take by mouth.   . SYNTHROID 200 MCG tablet TAKE 1 TABLET (200 MCG TOTAL) BY MOUTH DAILY. TAKE 1 TABLET DAILY AS DIRECTED (CURRENTLY 5 DAYS/WEEK)   . [DISCONTINUED] buPROPion (WELLBUTRIN XL) 300 MG 24 hr tablet TAKE 1 TABLET BY MOUTH DAILY   . [DISCONTINUED] FLUoxetine (PROZAC) 40 MG capsule Take 1 capsule (40 mg total) by mouth daily.   . cyclobenzaprine (FLEXERIL) 10 MG tablet Take 1 tablet (10 mg total) by mouth 3 (three) times daily as needed for muscle spasms.   . [  DISCONTINUED] azithromycin (ZITHROMAX) 500 MG tablet Take 1 tablet (500 mg total) by mouth daily.   . [DISCONTINUED] cyclobenzaprine (FLEXERIL) 10 MG tablet Take 1 tablet (10 mg total) by mouth 3 (three) times daily as needed for muscle spasms. (Patient not taking: Reported on 08/21/2014) 08/21/2014: Uses prn back pain, not currently.  Worse when playing with grandkids  . [DISCONTINUED] HYDROcodone-acetaminophen (HYCET) 7.5-325 mg/15 ml solution Take 15 mLs by mouth every 8 (eight) hours as needed for moderate pain.     No Known Allergies  ROS: The patient denies anorexia, fever, headaches, vision changes, decreased hearing, ear pain, sore throat, breast concerns, chest pain, palpitations, dizziness, syncope, dyspnea on exertion, cough, swelling, nausea, vomiting, diarrhea, constipation, abdominal pain, melena, hematochezia, indigestion/heartburn, hematuria, incontinence, dysuria, vaginal bleeding, discharge, odor or itch, genital lesions, numbness, tingling, weakness, tremor, suspicious skin lesions, abnormal bleeding/bruising, or enlarged lymph nodes. +10# weight gain in the last year +bilateral knee pain (getting injections from ortho), R>L Depression is well controlled Some morning cough related to allergies. Left jaw pain and left forearm pain as per HPI.   PHYSICAL EXAM:  BP 140/80 mmHg  Pulse 64  Temp(Src) 98.2 F (36.8 C)  (Tympanic)  Ht 5' 3.5" (1.613 m)  Wt 229 lb 9.6 oz (104.146 kg)  BMI 40.03 kg/m2  LMP 05/05/1990  General Appearance:   Alert, cooperative, no distress, appears stated age  Head:   Normocephalic, without obvious abnormality, atraumatic  Eyes:   PERRL, conjunctiva/corneas clear, EOM's intact, fundi   benign  Ears:   Normal TM's and external ear canals. She has some crepitus and clicking at TMJ, R>L. Tender slightly on the left.  Nose:  Nares normal, mucosa normal, no drainage or sinus tenderness  Throat:  Lips, mucosa, and tongue normal; teeth and gums normal  Neck:  Supple, no lymphadenopathy; thyroid: no enlargement/tenderness/nodules; no carotid  bruit or JVD  Back:  Spine nontender, no curvature, ROM normal, no CVA tenderness  Lungs:   Clear to auscultation bilaterally without wheezes, rales or ronchi; respirations unlabored  Chest Wall:   No tenderness or deformity  Heart:   Regular rate and rhythm, S1 and S2 normal, no rub  or gallop. 2/6 SEM heard loudest at RUSB, unchanged  Breast Exam:   No tenderness, masses, or nipple discharge or inversion. No axillary lymphadenopathy  Abdomen:   Soft, non-tender, nondistended, normoactive bowel sounds,   no masses, no hepatosplenomegaly  Genitalia:   Normal external genitalia without lesions. BUS and vagina normal; no cervical motion tenderness. No abnormal vaginal discharge. Uterus and adnexa not enlarged, nontender, no masses. Pap not performed  Rectal:   Normal tone, no masses or tenderness; guaiac negative stool  Extremities:  No clubbing, cyanosis or edema. WHSS on left forearm; mildly tender at one portion of bone--no erythema, warmth or other abnormality noted.  Some bruising noted (from her dogs)  Pulses:  2+ and symmetric all extremities  Skin:  Skin color, texture, turgor normal, no rashes or lesions  Lymph nodes:  Cervical, supraclavicular, and  axillary nodes normal  Neurologic:  CNII-XII intact, normal strength, sensation and gait; reflexes 2+ and symmetric throughout   Psych: Normal mood, affect, hygiene and grooming.       ASSESSMENT/PLAN:  Hypothyroidism, unspecified hypothyroidism type - Plan: TSH  Depression, major, in remission - Plan: buPROPion (WELLBUTRIN XL) 300 MG 24 hr tablet, FLUoxetine (PROZAC) 40 MG capsule  Vitamin D deficiency - Plan: Vit D  25 hydroxy (rtn osteoporosis monitoring)  Obesity, Class III,  BMI 40-49.9 (morbid obesity)  Impaired fasting glucose - Plan: Hemoglobin A1c  Left arm pain - reassured no e/o infection; rec x-ray, but she declined, prefers to f/u with ortho - Plan: CBC with Differential/Platelet  Medication monitoring encounter - Plan: Comprehensive metabolic panel  Medicare annual wellness visit, initial  Muscle spasm of back - Plan: cyclobenzaprine (FLEXERIL) 10 MG tablet  Jaw pain - suspect TMJ; avoid hard foods; f/u with dentist; consider x-ray and PT (declines for now)  Left forearm pain.  X-ray recommended.  She prefers to discuss with ortho (and get x-rays there) rather than me order x-rays.  Discussed differential and s/sx of infection.  plans to resume going to the gym. Cut back on carbs.  Diet reviewed in detail.  Discussed monthly self breast exams and yearly mammograms (she is past due and reminded to schedule); at least 30 minutes of aerobic activity at least 5 days/week; proper sunscreen use reviewed; healthy diet, including goals of calcium and vitamin D intake and alcohol recommendations (less than or equal to 1 drink/day) reviewed; regular seatbelt use; changing batteries in smoke detectors. Immunization recommendations discussed--yearly flu shots recommended, UTD. Colonoscopy recommendations reviewed, UTD. DEXA was recommended last year, never done--encouraged to schedule along with her mammogram.  c-met, TSH, Vit D, CBC, A1c (was  5.7 last year) Lipid not done--okay last year, ?coverage. Add on lipid panel only if sugar significantly high Lab Results  Component Value Date   CHOL 167 06/08/2013   HDL 50 06/08/2013   LDLCALC 104* 06/08/2013   TRIG 66 06/08/2013   CHOLHDL 3.3 06/08/2013    MOST form reviewed/updated. Given another set of Living Will, healthcare POA handouts--encouraged her to discuss with her son, fill out, get notarized and get [place] copy.    Medicare Attestation I have personally reviewed: The patient's medical and social history Their use of alcohol, tobacco or illicit drugs Their current medications and supplements The patient's functional ability including ADLs,fall risks, home safety risks, cognitive, and hearing and visual impairment Diet and physical activities Evidence for depression or mood disorders  The patient's weight, height, BMI, and visual acuity have been recorded in the chart.  I have made referrals, counseling, and provided education to the patient based on review of the above and I have provided the patient with a written personalized care plan for preventive services.     Antoinetta Berrones A, MD   08/21/2014

## 2014-08-22 LAB — HEMOGLOBIN A1C
Hgb A1c MFr Bld: 5.8 % — ABNORMAL HIGH (ref <5.7)
MEAN PLASMA GLUCOSE: 120 mg/dL — AB (ref <117)

## 2014-08-22 LAB — VITAMIN D 25 HYDROXY (VIT D DEFICIENCY, FRACTURES): Vit D, 25-Hydroxy: 57 ng/mL (ref 30–100)

## 2014-08-22 MED ORDER — SYNTHROID 200 MCG PO TABS: ORAL_TABLET | ORAL | Status: DC

## 2014-09-15 ENCOUNTER — Ambulatory Visit: Payer: Medicare Other | Admitting: Medical

## 2014-09-18 ENCOUNTER — Ambulatory Visit (INDEPENDENT_AMBULATORY_CARE_PROVIDER_SITE_OTHER): Payer: Medicare Other | Admitting: Family Medicine

## 2014-09-18 ENCOUNTER — Encounter: Payer: Self-pay | Admitting: Family Medicine

## 2014-09-18 VITALS — BP 130/76 | HR 60 | Ht 63.5 in | Wt 228.4 lb

## 2014-09-18 DIAGNOSIS — N950 Postmenopausal bleeding: Secondary | ICD-10-CM | POA: Diagnosis not present

## 2014-09-18 NOTE — Progress Notes (Signed)
Chief Complaint  Patient presents with  . Vaginal Bleeding    last Thursday had once episode of vaginal bleeding-4 drops of blood but is concerned and would like to have checked out.     After taking a shower Thursday morning, while wiping after voiding, she saw a few drops of bright red blood on the toilet paper.  No blood in toilet.  She went to the bathroom a short while later, and also saw some small amount of bright red blood.  Denies blood in the urine or any urinary symptoms. Hasn't seen any bleeding since then. Denies vaginal discharge, odor, itch.  Not currently sexually active.  PMH, PSH, SH reviewed.  Outpatient Encounter Prescriptions as of 09/18/2014  Medication Sig  . aspirin 81 MG tablet Take 81 mg by mouth daily.    Marland Kitchen buPROPion (WELLBUTRIN XL) 300 MG 24 hr tablet Take 1 tablet (300 mg total) by mouth daily.  . Cholecalciferol (VITAMIN D) 1000 UNITS capsule Take 1,000 Units by mouth daily.    . cyclobenzaprine (FLEXERIL) 10 MG tablet Take 1 tablet (10 mg total) by mouth 3 (three) times daily as needed for muscle spasms.  . fish oil-omega-3 fatty acids 1000 MG capsule Take 2 g by mouth daily.    Marland Kitchen FLUoxetine (PROZAC) 40 MG capsule Take 1 capsule (40 mg total) by mouth daily.  . Probiotic Product (PROBIOTIC DAILY PO) Take by mouth.  . SYNTHROID 200 MCG tablet TAKE 1 TABLET (200 MCG TOTAL) BY MOUTH DAILY. TAKE 1 TABLET DAILY AS DIRECTED  . naproxen sodium (ANAPROX) 220 MG tablet Take 440 mg by mouth 2 (two) times daily with a meal.   No facility-administered encounter medications on file as of 09/18/2014.   No Known Allergies  ROS: no fever, chills, URI symptoms, vaginal discharge, urinary complaints, other bleeding, bruising or other concerns.  PHYSICAL EXAM: BP 130/76 mmHg  Pulse 60  Ht 5' 3.5" (1.613 m)  Wt 228 lb 6.4 oz (103.602 kg)  BMI 39.82 kg/m2  LMP 05/05/1990 Well developed, pleasant, obese female in no distress External genitalia:  There is an area of mild  inflammation on the left side of the urethra.  Otherwise, external exam is completely normal.  Bimanual exam is normal, no masses.  No vaginal bleeding or spotting noted.  ASSESSMENT/PLAN:  Postmenopausal vaginal bleeding  Suspect that the area of bleeding was periurethral, likely related to washing, cannot r/o some mild postmenopausal atrophic changes.  Given lack of all other urethral/urinary symptoms, I did not recommend treatment with topical estrogen at this time.  Gentle washing.   I do not suspect bleeding to be vaginal given lack of any spotting, just very superficial bleeding noted twice within an hour or so. Patient was reassured.  Reviewed s/sx for which she should return for re-evaluation.

## 2014-09-18 NOTE — Patient Instructions (Signed)
I believe the source of your bleeding was from irritation around the urethral opening.  NOT from your uterus. Return if you have any ongoing bleeding, concerns, especially any spotting in the underwear. Wipe and wash very gently.

## 2014-10-23 ENCOUNTER — Encounter: Payer: Medicare Other | Admitting: Family Medicine

## 2014-10-24 ENCOUNTER — Emergency Department (HOSPITAL_COMMUNITY): Payer: Medicare Other

## 2014-10-24 ENCOUNTER — Ambulatory Visit (INDEPENDENT_AMBULATORY_CARE_PROVIDER_SITE_OTHER): Payer: Medicare Other | Admitting: Medical

## 2014-10-24 ENCOUNTER — Emergency Department (HOSPITAL_COMMUNITY)
Admission: EM | Admit: 2014-10-24 | Discharge: 2014-10-24 | Disposition: A | Discharge status: Home / Self Care | Payer: Medicare Other | Attending: Emergency Medicine | Admitting: Emergency Medicine

## 2014-10-24 ENCOUNTER — Encounter (HOSPITAL_COMMUNITY): Payer: Self-pay | Admitting: Emergency Medicine

## 2014-10-24 VITALS — BP 150/80 | HR 83 | Temp 98.3°F | Resp 16 | Wt 228.0 lb

## 2014-10-24 DIAGNOSIS — R319 Hematuria, unspecified: Secondary | ICD-10-CM | POA: Diagnosis not present

## 2014-10-24 DIAGNOSIS — E559 Vitamin D deficiency, unspecified: Secondary | ICD-10-CM | POA: Diagnosis not present

## 2014-10-24 DIAGNOSIS — Z87442 Personal history of urinary calculi: Secondary | ICD-10-CM | POA: Diagnosis not present

## 2014-10-24 DIAGNOSIS — E039 Hypothyroidism, unspecified: Secondary | ICD-10-CM | POA: Diagnosis not present

## 2014-10-24 DIAGNOSIS — E669 Obesity, unspecified: Secondary | ICD-10-CM | POA: Diagnosis not present

## 2014-10-24 DIAGNOSIS — Z791 Long term (current) use of non-steroidal anti-inflammatories (NSAID): Secondary | ICD-10-CM | POA: Diagnosis not present

## 2014-10-24 DIAGNOSIS — Z79899 Other long term (current) drug therapy: Secondary | ICD-10-CM | POA: Diagnosis not present

## 2014-10-24 DIAGNOSIS — F329 Major depressive disorder, single episode, unspecified: Secondary | ICD-10-CM | POA: Diagnosis not present

## 2014-10-24 DIAGNOSIS — Z8719 Personal history of other diseases of the digestive system: Secondary | ICD-10-CM | POA: Insufficient documentation

## 2014-10-24 DIAGNOSIS — R109 Unspecified abdominal pain: Secondary | ICD-10-CM

## 2014-10-24 DIAGNOSIS — R350 Frequency of micturition: Secondary | ICD-10-CM

## 2014-10-24 DIAGNOSIS — Z7982 Long term (current) use of aspirin: Secondary | ICD-10-CM | POA: Insufficient documentation

## 2014-10-24 DIAGNOSIS — Z87891 Personal history of nicotine dependence: Secondary | ICD-10-CM | POA: Insufficient documentation

## 2014-10-24 LAB — POCT URINALYSIS DIPSTICK
GLUCOSE UA: NEGATIVE
Ketones, UA: NEGATIVE
Nitrite, UA: POSITIVE
PROTEIN UA: NEGATIVE
UROBILINOGEN UA: NEGATIVE
pH, UA: 6

## 2014-10-24 LAB — CBC WITH DIFFERENTIAL/PLATELET
BASOS PCT: 0 % (ref 0–1)
Basophils Absolute: 0 10*3/uL (ref 0.0–0.1)
Eosinophils Absolute: 0.2 10*3/uL (ref 0.0–0.7)
Eosinophils Relative: 3 % (ref 0–5)
HCT: 35 % — ABNORMAL LOW (ref 36.0–46.0)
Hemoglobin: 11.7 g/dL — ABNORMAL LOW (ref 12.0–15.0)
LYMPHS PCT: 26 % (ref 12–46)
Lymphs Abs: 1.8 10*3/uL (ref 0.7–4.0)
MCH: 27 pg (ref 26.0–34.0)
MCHC: 33.4 g/dL (ref 30.0–36.0)
MCV: 80.6 fL (ref 78.0–100.0)
Monocytes Absolute: 0.4 10*3/uL (ref 0.1–1.0)
Monocytes Relative: 5 % (ref 3–12)
NEUTROS ABS: 4.4 10*3/uL (ref 1.7–7.7)
Neutrophils Relative %: 66 % (ref 43–77)
Platelets: 191 10*3/uL (ref 150–400)
RBC: 4.34 MIL/uL (ref 3.87–5.11)
RDW: 14 % (ref 11.5–15.5)
WBC: 6.8 10*3/uL (ref 4.0–10.5)

## 2014-10-24 LAB — URINALYSIS, ROUTINE W REFLEX MICROSCOPIC
BILIRUBIN URINE: NEGATIVE
Glucose, UA: NEGATIVE mg/dL
Ketones, ur: NEGATIVE mg/dL
Nitrite: NEGATIVE
PROTEIN: NEGATIVE mg/dL
Specific Gravity, Urine: 1.019 (ref 1.005–1.030)
UROBILINOGEN UA: 0.2 mg/dL (ref 0.0–1.0)
pH: 5.5 (ref 5.0–8.0)

## 2014-10-24 LAB — COMPREHENSIVE METABOLIC PANEL
ALT: 15 U/L (ref 14–54)
AST: 17 U/L (ref 15–41)
Albumin: 3.5 g/dL (ref 3.5–5.0)
Alkaline Phosphatase: 106 U/L (ref 38–126)
Anion gap: 5 (ref 5–15)
BUN: 13 mg/dL (ref 6–20)
CO2: 28 mmol/L (ref 22–32)
Calcium: 9.1 mg/dL (ref 8.9–10.3)
Chloride: 105 mmol/L (ref 101–111)
Creatinine, Ser: 0.73 mg/dL (ref 0.44–1.00)
GFR calc Af Amer: >60 mL/min (ref >60)
GFR calc non Af Amer: >60 mL/min (ref >60)
Glucose, Bld: 111 mg/dL — ABNORMAL HIGH (ref 65–99)
Potassium: 3.9 mmol/L (ref 3.5–5.1)
SODIUM: 138 mmol/L (ref 135–145)
Total Bilirubin: 0.2 mg/dL — ABNORMAL LOW (ref 0.3–1.2)
Total Protein: 6.5 g/dL (ref 6.5–8.1)

## 2014-10-24 LAB — URINE MICROSCOPIC-ADD ON

## 2014-10-24 MED ORDER — CIPROFLOXACIN HCL 500 MG PO TABS: 500.0000 mg | ORAL_TABLET | Freq: Two times a day (BID) | ORAL | Status: DC

## 2014-10-24 MED ORDER — OXYCODONE-ACETAMINOPHEN 5-325 MG PO TABS
1.0000 | ORAL_TABLET | Freq: Once | ORAL | Status: AC
  Administered 2014-10-24: 1 via ORAL

## 2014-10-24 MED ORDER — KETOROLAC TROMETHAMINE 30 MG/ML IJ SOLN
15.0000 mg | Freq: Once | INTRAMUSCULAR | Status: AC
  Administered 2014-10-24: 15 mg via INTRAVENOUS
  Filled 2014-10-24: qty 1

## 2014-10-24 MED ORDER — OXYCODONE-ACETAMINOPHEN 5-325 MG PO TABS
ORAL_TABLET | ORAL | Status: AC
  Filled 2014-10-24: qty 1

## 2014-10-24 MED ORDER — HYDROCODONE-ACETAMINOPHEN 5-325 MG PO TABS: 1.0000 | ORAL_TABLET | Freq: Three times a day (TID) | ORAL | Status: DC | PRN

## 2014-10-24 MED ORDER — SODIUM CHLORIDE 0.9 % IV BOLUS (SEPSIS)
1000.0000 mL | Freq: Once | INTRAVENOUS | Status: AC
  Administered 2014-10-24: 1000 mL via INTRAVENOUS

## 2014-10-24 MED ORDER — HYDROCODONE-ACETAMINOPHEN 5-325 MG PO TABS
1.0000 | ORAL_TABLET | Freq: Once | ORAL | Status: AC
  Administered 2014-10-24: 1 via ORAL
  Filled 2014-10-24: qty 1

## 2014-10-24 NOTE — Progress Notes (Signed)
Subjective:  Here for right side pain.  She notes about a week hx/o right lower side pain, somewhat even in right low to mid back.   Denies pain with hip ROM, but does hurt to walk.   Denies injury, trauma, or fall. She does note urinary frequency, and has seen blood in urine in past week or 2.   Denies burning with urination, no odor to urine, no fever, no NVD, no stool changes, no diarrhea.   Feels like she is getting sick, illness may be starting, worried about UTI.  Last UTI years ago. Taking some aleve. No vaginal c/o.  No other aggravating or relieving factors. No other complaint.  Past Medical History  Diagnosis Date  . Hypothyroid   . Depression   . Impaired fasting glucose 5/07  . Obesity   . Vitamin D deficiency   . Diverticulosis    ROS as in subjective   Objective: BP 150/80 mmHg  Pulse 83  Temp(Src) 98.3 F (36.8 C)  Resp 16  Wt 228 lb (103.42 kg)  LMP 05/05/1990  General appearance: alert, no distress, WD/WN Heart: RRR, normal S1, S2, no murmurs Lungs: CTA bilaterally, no wheezes, rhonchi, or rales Abdomen: +bs, soft, non tender, non distended, no masses, no hepatomegaly, no splenomegaly Back: mild right SI tenderness, otherwise back nontender, ROM, but she seems to be in pain when walking MSK: legs and hips nontender, normal ROM Ext: no edema Neuro: LE strength and sensation intact Pulses: 2+ symmetric, upper and lower extremities, normal cap refill    Assessment: Encounter Diagnoses  Name Primary?  . Urinary frequency Yes  . Right flank pain     Plan: Urinalysis abnormal.  Exam shows mild right SI tenderness, but this doesn't explain the symptoms.  No signs of kidney stone.  At this point, urine culture sent, begin Cipro for likely UTI/kidney infection, hydrate well, OTC analgesics.  Discussed symptoms and signs that would prompt recheck for pyleo or other worse etiology  Bo was seen today for right side below the waist pain.  Diagnoses and all  orders for this visit:  Urinary frequency Orders: -     Urinalysis Dipstick -     ciprofloxacin (CIPRO) 500 MG tablet; Take 1 tablet (500 mg total) by mouth 2 (two) times daily. -     Urine culture  Right flank pain Orders: -     Urinalysis Dipstick -     ciprofloxacin (CIPRO) 500 MG tablet; Take 1 tablet (500 mg total) by mouth 2 (two) times daily. -     Urine culture

## 2014-10-24 NOTE — Discharge Instructions (Signed)
As discussed, your flank pain is likely due to the presence of kidney stones. With some evidence for concurrent infection is important to take all medication as directed, and be sure to follow-up with our urologist.  Return here for concerning changes in your condition.

## 2014-10-24 NOTE — ED Notes (Signed)
IV removed.

## 2014-10-24 NOTE — ED Provider Notes (Signed)
CSN: [id number]     Arrival date & time 10/24/14  1413 History   First MD Initiated Contact with Patient 10/24/14 1616     Chief Complaint  Patient presents with  . Flank Pain     (Consider location/radiation/quality/duration/timing/severity/associated sxs/prior Treatment) HPI Patient presents with concern of new right flank pain. Pain began about 3 days ago, but over the past 24 hours has become present, consistent, more severe. The pain is focally in the right flank, nonradiating, sharp, not improved with OTC medication. There is no associated dysuria or hematuria, though the patient reports that outpatient urinalysis today was abnormal. Patient was started on ciprofloxacin today, but has taken one dose only. No associated other abdominal pain, chest pain, dyspnea. Patient has a history of kidney stone in the distant past.  Past Medical History  Diagnosis Date  . Hypothyroid   . Depression   . Impaired fasting glucose 5/07  . Obesity   . Vitamin D deficiency   . Diverticulosis    Past Surgical History  Procedure Laterality Date  . Thyroidectomy  1975  . Tonsillectomy    . Appendectomy    . Cesarean section    . Orif forearm fracture  2002  . Colonoscopy  02/04/2011    diverticulosis   Family History  Problem Relation Age of Onset  . Parkinsonism Mother   . Diabetes Brother   . Heart disease Brother   . Cancer Neg Hx   . Colon cancer Neg Hx   . Stomach cancer Neg Hx   . HIV Brother   . Arthritis Sister   . Arthritis Sister    History  Substance Use Topics  . Smoking status: Former Smoker    Quit date: 05/05/1978  . Smokeless tobacco: Never Used  . Alcohol Use: 0.0 oz/week    0 Standard drinks or equivalent per week     Comment: a glass of wine or beer once or twice a month   OB History    Gravida Para Term Preterm AB TAB SAB Ectopic Multiple Living   1 1 1       1      Review of Systems  Constitutional:       Per HPI, otherwise negative  HENT:   Per HPI, otherwise negative  Respiratory:       Per HPI, otherwise negative  Cardiovascular:       Per HPI, otherwise negative  Gastrointestinal: Negative for vomiting.  Endocrine:       Negative aside from HPI  Genitourinary:       Neg aside from HPI   Musculoskeletal:       Per HPI, otherwise negative  Skin: Negative.   Neurological: Negative for syncope.      Allergies  Review of patient's allergies indicates no known allergies.  Home Medications   Prior to Admission medications   Medication Sig Start Date End Date Taking? Authorizing Provider  aspirin 81 MG tablet Take 81 mg by mouth daily.     Yes Historical Provider, MD  buPROPion (WELLBUTRIN XL) 300 MG 24 hr tablet Take 1 tablet (300 mg total) by mouth daily. 08/21/14  Yes Rita Ohara, MD  Cholecalciferol (VITAMIN D) 1000 UNITS capsule Take 1,000 Units by mouth daily.     Yes Historical Provider, MD  ciprofloxacin (CIPRO) 500 MG tablet Take 1 tablet (500 mg total) by mouth 2 (two) times daily. Patient taking differently: Take 500 mg by mouth 2 (two) times daily. started 10/24/14, for 5  days ending 10/29/14 10/24/14  Yes Camelia Eng Tysinger, PA-C  cyclobenzaprine (FLEXERIL) 10 MG tablet Take 1 tablet (10 mg total) by mouth 3 (three) times daily as needed for muscle spasms. 08/21/14  Yes Rita Ohara, MD  fish oil-omega-3 fatty acids 1000 MG capsule Take 2 g by mouth daily.     Yes Historical Provider, MD  FLUoxetine (PROZAC) 40 MG capsule Take 1 capsule (40 mg total) by mouth daily. 08/21/14  Yes Rita Ohara, MD  naproxen sodium (ANAPROX) 220 MG tablet Take 440 mg by mouth 2 (two) times daily with a meal.   Yes Historical Provider, MD  Probiotic Product (PROBIOTIC DAILY PO) Take 1 capsule by mouth daily.    Yes Historical Provider, MD  SYNTHROID 200 MCG tablet TAKE 1 TABLET (200 MCG TOTAL) BY MOUTH DAILY. TAKE 1 TABLET DAILY AS DIRECTED 08/22/14  Yes Rita Ohara, MD   BP 160/71 mmHg  Pulse 66  Temp(Src) 98.7 F (37.1 C) (Oral)  Resp 20   Ht 5\' 5"  (1.651 m)  Wt 228 lb (103.42 kg)  BMI 37.94 kg/m2  SpO2 97%  LMP 05/05/1990 Physical Exam  Constitutional: She is oriented to person, place, and time. She appears well-developed and well-nourished. No distress.  HENT:  Head: Normocephalic and atraumatic.  Eyes: Conjunctivae and EOM are normal.  Cardiovascular: Normal rate and regular rhythm.   Pulmonary/Chest: Effort normal and breath sounds normal. No stridor. No respiratory distress.  Abdominal: She exhibits no distension. There is no tenderness.  Musculoskeletal: She exhibits no edema.  Neurological: She is alert and oriented to person, place, and time. No cranial nerve deficit.  Skin: Skin is warm and dry.  Psychiatric: She has a normal mood and affect.  Nursing note and vitals reviewed.   ED Course  Procedures (including critical care time) Labs Review Labs Reviewed  CBC WITH DIFFERENTIAL/PLATELET - Abnormal; Notable for the following:    Hemoglobin 11.7 (*)    HCT 35.0 (*)    All other components within normal limits  COMPREHENSIVE METABOLIC PANEL - Abnormal; Notable for the following:    Glucose, Bld 111 (*)    Total Bilirubin 0.2 (*)    All other components within normal limits  URINALYSIS, ROUTINE W REFLEX MICROSCOPIC (NOT AT [facility]) - Abnormal; Notable for the following:    APPearance HAZY (*)    Hgb urine dipstick MODERATE (*)    Leukocytes, UA LARGE (*)    All other components within normal limits  URINE MICROSCOPIC-ADD ON - Abnormal; Notable for the following:    Squamous Epithelial / LPF MANY (*)    All other components within normal limits    Imaging Review US Renal  10/24/2014   CLINICAL DATA:  Right flank pain and history of stones  EXAM: RENAL / URINARY TRACT ULTRASOUND COMPLETE  COMPARISON:  At 10:20 2013 abdominal CT  FINDINGS: Right Kidney:  Length: 13.5 cm. No hydronephrosis. Measured echogenic foci could reflect calculi, as suggested by the history, but are not definitive as there is no clear  shadowing or twinkling artifact. No ring down artifact to suggest collecting system gas. No visible mass lesion. Hypo/non enhancing areas noted on CT 02/22/2012 are not seen.  Left Kidney:  Length: 12.5 cm. No hydronephrosis. Normal echogenicity. No evidence of mass lesion.  Bladder:  Appears normal for degree of bladder distention.  28 mm anechoic and nonvascular structure in the lower spleen, presumably a pseudocyst.  IMPRESSION: 1. No hydronephrosis. 2. 28 mm splenic cyst/pseudocyst.   Electronically  Signed   By: Monte Fantasia M.D.   On: 10/24/2014 19:14  Initial labs notable for hematuria   8:22 PM Pain resolved.  Discussed all findings, including ultrasound results. Patient will follow-up with urology, continue her recently started antibiotics.  MDM   Final diagnoses:  Flank pain  Hematuria   Patient with history of kidney stones in the distant past presents with new flank pain. Here the patient is awake, alert, afebrile, hemodynamically stable. Some evidence for infection, though the patient's presentation is more consistent with nephrolithiasis. Pain resolved here, with low suspicion for bacteremia, sepsis, complete obstruction, she was discharged to follow-up with urology.  Carmin Muskrat, MD 10/24/14 2023

## 2014-10-24 NOTE — ED Notes (Signed)
Pt sts right sided flank pain x 3 days worse today; pt sts PCP saw blood in urine

## 2014-10-25 ENCOUNTER — Telehealth: Payer: Self-pay | Admitting: Internal Medicine

## 2014-10-25 DIAGNOSIS — R109 Unspecified abdominal pain: Secondary | ICD-10-CM | POA: Diagnosis not present

## 2014-10-25 DIAGNOSIS — R350 Frequency of micturition: Secondary | ICD-10-CM | POA: Diagnosis not present

## 2014-10-25 NOTE — Telephone Encounter (Signed)
Pt called states she wants you to know that she went to ER and had kidney stones and kidney infections and told her to continue Cipro that it would help. But she just wanted to let you know

## 2014-10-25 NOTE — Addendum Note (Signed)
Addended by: Armanda Magic on: 10/25/2014 08:18 AM   Modules accepted: Orders

## 2014-10-27 DIAGNOSIS — R312 Other microscopic hematuria: Secondary | ICD-10-CM | POA: Diagnosis not present

## 2014-10-28 LAB — URINE CULTURE: Colony Count: >=100000

## 2014-10-30 DIAGNOSIS — M545 Low back pain: Secondary | ICD-10-CM | POA: Diagnosis not present

## 2014-10-30 DIAGNOSIS — M6281 Muscle weakness (generalized): Secondary | ICD-10-CM | POA: Diagnosis not present

## 2014-10-30 DIAGNOSIS — M62838 Other muscle spasm: Secondary | ICD-10-CM | POA: Diagnosis not present

## 2014-10-30 DIAGNOSIS — N393 Stress incontinence (female) (male): Secondary | ICD-10-CM | POA: Diagnosis not present

## 2014-10-31 DIAGNOSIS — M1711 Unilateral primary osteoarthritis, right knee: Secondary | ICD-10-CM | POA: Diagnosis not present

## 2014-11-02 DIAGNOSIS — M62838 Other muscle spasm: Secondary | ICD-10-CM | POA: Diagnosis not present

## 2014-11-02 DIAGNOSIS — N393 Stress incontinence (female) (male): Secondary | ICD-10-CM | POA: Diagnosis not present

## 2014-11-02 DIAGNOSIS — M6281 Muscle weakness (generalized): Secondary | ICD-10-CM | POA: Diagnosis not present

## 2014-11-02 DIAGNOSIS — M545 Low back pain: Secondary | ICD-10-CM | POA: Diagnosis not present

## 2014-11-08 DIAGNOSIS — M1711 Unilateral primary osteoarthritis, right knee: Secondary | ICD-10-CM | POA: Diagnosis not present

## 2014-11-13 DIAGNOSIS — M1711 Unilateral primary osteoarthritis, right knee: Secondary | ICD-10-CM | POA: Diagnosis not present

## 2014-11-20 DIAGNOSIS — M545 Low back pain: Secondary | ICD-10-CM | POA: Diagnosis not present

## 2014-11-20 DIAGNOSIS — N393 Stress incontinence (female) (male): Secondary | ICD-10-CM | POA: Diagnosis not present

## 2014-11-20 DIAGNOSIS — M6281 Muscle weakness (generalized): Secondary | ICD-10-CM | POA: Diagnosis not present

## 2014-11-20 DIAGNOSIS — M62838 Other muscle spasm: Secondary | ICD-10-CM | POA: Diagnosis not present

## 2014-11-23 DIAGNOSIS — N393 Stress incontinence (female) (male): Secondary | ICD-10-CM | POA: Diagnosis not present

## 2014-11-23 DIAGNOSIS — M62838 Other muscle spasm: Secondary | ICD-10-CM | POA: Diagnosis not present

## 2014-11-23 DIAGNOSIS — M545 Low back pain: Secondary | ICD-10-CM | POA: Diagnosis not present

## 2014-11-23 DIAGNOSIS — M6281 Muscle weakness (generalized): Secondary | ICD-10-CM | POA: Diagnosis not present

## 2014-11-30 ENCOUNTER — Telehealth: Payer: Self-pay | Admitting: *Deleted

## 2014-11-30 ENCOUNTER — Ambulatory Visit (INDEPENDENT_AMBULATORY_CARE_PROVIDER_SITE_OTHER): Payer: Medicare Other | Admitting: Family Medicine

## 2014-11-30 ENCOUNTER — Ambulatory Visit: Payer: Medicare Other | Admitting: Family Medicine

## 2014-11-30 ENCOUNTER — Encounter: Payer: Self-pay | Admitting: Family Medicine

## 2014-11-30 DIAGNOSIS — M542 Cervicalgia: Secondary | ICD-10-CM | POA: Diagnosis not present

## 2014-11-30 DIAGNOSIS — M62838 Other muscle spasm: Secondary | ICD-10-CM | POA: Diagnosis not present

## 2014-11-30 DIAGNOSIS — S8001XA Contusion of right knee, initial encounter: Secondary | ICD-10-CM | POA: Diagnosis not present

## 2014-11-30 DIAGNOSIS — M545 Low back pain, unspecified: Secondary | ICD-10-CM

## 2014-11-30 DIAGNOSIS — M6281 Muscle weakness (generalized): Secondary | ICD-10-CM | POA: Diagnosis not present

## 2014-11-30 DIAGNOSIS — M25511 Pain in right shoulder: Secondary | ICD-10-CM | POA: Diagnosis not present
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
## 2014-11-30 NOTE — Patient Instructions (Signed)
  Moist heat, stretches and massage will help the neck and back pain.   Continue the physical therapy for the back. Continue the Aleve twice daily. Fill the flexeril prescription that should be left at the pharmacy and take it at bedtime if needed for muscle pain/spasm.  You have a knee contusion--you can try icing this. This should get better with time.

## 2014-11-30 NOTE — Telephone Encounter (Signed)
Faxed

## 2014-11-30 NOTE — Telephone Encounter (Signed)
Ethel for eval

## 2014-12-01 DIAGNOSIS — M25511 Pain in right shoulder: Secondary | ICD-10-CM | POA: Diagnosis not present

## 2014-12-01 DIAGNOSIS — M542 Cervicalgia: Secondary | ICD-10-CM | POA: Diagnosis not present

## 2014-12-01 DIAGNOSIS — M6281 Muscle weakness (generalized): Secondary | ICD-10-CM | POA: Diagnosis not present

## 2014-12-01 DIAGNOSIS — M62838 Other muscle spasm: Secondary | ICD-10-CM | POA: Diagnosis not present

## 2014-12-01 DIAGNOSIS — M545 Low back pain: Secondary | ICD-10-CM | POA: Diagnosis not present

## 2014-12-06 DIAGNOSIS — M25511 Pain in right shoulder: Secondary | ICD-10-CM | POA: Diagnosis not present

## 2014-12-06 DIAGNOSIS — M62838 Other muscle spasm: Secondary | ICD-10-CM | POA: Diagnosis not present

## 2014-12-06 DIAGNOSIS — M542 Cervicalgia: Secondary | ICD-10-CM | POA: Diagnosis not present

## 2014-12-06 DIAGNOSIS — M6281 Muscle weakness (generalized): Secondary | ICD-10-CM | POA: Diagnosis not present

## 2014-12-07 DIAGNOSIS — M25561 Pain in right knee: Secondary | ICD-10-CM | POA: Diagnosis not present

## 2014-12-07 DIAGNOSIS — M1712 Unilateral primary osteoarthritis, left knee: Secondary | ICD-10-CM | POA: Diagnosis not present

## 2014-12-08 DIAGNOSIS — M79661 Pain in right lower leg: Secondary | ICD-10-CM | POA: Diagnosis not present

## 2014-12-08 DIAGNOSIS — M62838 Other muscle spasm: Secondary | ICD-10-CM | POA: Diagnosis not present

## 2014-12-08 DIAGNOSIS — R269 Unspecified abnormalities of gait and mobility: Secondary | ICD-10-CM | POA: Diagnosis not present

## 2014-12-08 DIAGNOSIS — M79601 Pain in right arm: Secondary | ICD-10-CM | POA: Diagnosis not present

## 2014-12-08 DIAGNOSIS — M25561 Pain in right knee: Secondary | ICD-10-CM | POA: Diagnosis not present

## 2014-12-08 DIAGNOSIS — M6281 Muscle weakness (generalized): Secondary | ICD-10-CM | POA: Diagnosis not present

## 2014-12-15 DIAGNOSIS — M1712 Unilateral primary osteoarthritis, left knee: Secondary | ICD-10-CM | POA: Diagnosis not present

## 2014-12-22 DIAGNOSIS — M1712 Unilateral primary osteoarthritis, left knee: Secondary | ICD-10-CM | POA: Diagnosis not present

## 2015-01-03 DIAGNOSIS — S8011XA Contusion of right lower leg, initial encounter: Secondary | ICD-10-CM | POA: Diagnosis not present

## 2015-01-03 DIAGNOSIS — M6281 Muscle weakness (generalized): Secondary | ICD-10-CM | POA: Diagnosis not present

## 2015-01-03 DIAGNOSIS — M62838 Other muscle spasm: Secondary | ICD-10-CM | POA: Diagnosis not present

## 2015-01-03 DIAGNOSIS — R269 Unspecified abnormalities of gait and mobility: Secondary | ICD-10-CM | POA: Diagnosis not present

## 2015-01-03 DIAGNOSIS — M25561 Pain in right knee: Secondary | ICD-10-CM | POA: Diagnosis not present

## 2015-01-03 DIAGNOSIS — M79661 Pain in right lower leg: Secondary | ICD-10-CM | POA: Diagnosis not present

## 2015-01-11 DIAGNOSIS — M62838 Other muscle spasm: Secondary | ICD-10-CM | POA: Diagnosis not present

## 2015-01-11 DIAGNOSIS — M542 Cervicalgia: Secondary | ICD-10-CM | POA: Diagnosis not present

## 2015-01-11 DIAGNOSIS — M79661 Pain in right lower leg: Secondary | ICD-10-CM | POA: Diagnosis not present

## 2015-01-11 DIAGNOSIS — M6281 Muscle weakness (generalized): Secondary | ICD-10-CM | POA: Diagnosis not present

## 2015-01-11 DIAGNOSIS — M79601 Pain in right arm: Secondary | ICD-10-CM | POA: Diagnosis not present

## 2015-01-24 ENCOUNTER — Other Ambulatory Visit: Payer: Self-pay | Admitting: Orthopaedic Surgery

## 2015-01-24 DIAGNOSIS — M79604 Pain in right leg: Secondary | ICD-10-CM

## 2015-01-24 DIAGNOSIS — M47816 Spondylosis without myelopathy or radiculopathy, lumbar region: Secondary | ICD-10-CM

## 2015-01-24 DIAGNOSIS — M546 Pain in thoracic spine: Secondary | ICD-10-CM

## 2015-02-04 ENCOUNTER — Other Ambulatory Visit: Payer: Medicare Other

## 2015-02-04 ENCOUNTER — Ambulatory Visit
Admission: RE | Admit: 2015-02-04 | Discharge: 2015-02-04 | Disposition: A | Discharge status: Home / Self Care | Payer: Medicare Other | Source: Ambulatory Visit | Attending: Orthopaedic Surgery | Admitting: Orthopaedic Surgery

## 2015-02-04 DIAGNOSIS — M79604 Pain in right leg: Secondary | ICD-10-CM

## 2015-02-04 DIAGNOSIS — R600 Localized edema: Secondary | ICD-10-CM | POA: Diagnosis not present

## 2015-02-08 DIAGNOSIS — S8011XD Contusion of right lower leg, subsequent encounter: Secondary | ICD-10-CM | POA: Diagnosis not present

## 2015-03-09 DIAGNOSIS — M170 Bilateral primary osteoarthritis of knee: Secondary | ICD-10-CM | POA: Diagnosis not present

## 2015-03-09 DIAGNOSIS — S8011XS Contusion of right lower leg, sequela: Secondary | ICD-10-CM | POA: Diagnosis not present

## 2015-03-12 DIAGNOSIS — I10 Essential (primary) hypertension: Secondary | ICD-10-CM | POA: Diagnosis not present

## 2015-03-12 DIAGNOSIS — Z87891 Personal history of nicotine dependence: Secondary | ICD-10-CM | POA: Diagnosis not present

## 2015-03-12 DIAGNOSIS — I672 Cerebral atherosclerosis: Secondary | ICD-10-CM | POA: Diagnosis not present

## 2015-03-12 DIAGNOSIS — Z79899 Other long term (current) drug therapy: Secondary | ICD-10-CM | POA: Diagnosis not present

## 2015-03-12 DIAGNOSIS — R51 Headache: Secondary | ICD-10-CM | POA: Diagnosis not present

## 2015-03-12 DIAGNOSIS — N390 Urinary tract infection, site not specified: Secondary | ICD-10-CM | POA: Diagnosis not present

## 2015-03-14 ENCOUNTER — Encounter: Payer: Self-pay | Admitting: Family Medicine

## 2015-03-14 ENCOUNTER — Ambulatory Visit (INDEPENDENT_AMBULATORY_CARE_PROVIDER_SITE_OTHER): Payer: Medicare Other | Admitting: Family Medicine

## 2015-03-14 VITALS — BP 142/80 | HR 80 | Ht 63.5 in | Wt 223.0 lb

## 2015-03-14 DIAGNOSIS — I6523 Occlusion and stenosis of bilateral carotid arteries: Secondary | ICD-10-CM

## 2015-03-14 DIAGNOSIS — Z23 Encounter for immunization: Secondary | ICD-10-CM | POA: Diagnosis not present

## 2015-03-14 DIAGNOSIS — N3000 Acute cystitis without hematuria: Secondary | ICD-10-CM | POA: Diagnosis not present

## 2015-03-14 DIAGNOSIS — I10 Essential (primary) hypertension: Secondary | ICD-10-CM

## 2015-03-14 NOTE — Patient Instructions (Signed)
Continue the 12.5 mg (1/2 tablet) of HCTZ.  Try and follow a low sodium diet (see info).  Try and check your blood pressure elsewhere--since you have a monitor, be sure to bring it with you to your next visit.  Write down the values and keep a chart with the following columns: Date/morning/evening/comments.  You can also keep track of the pulse where you write down the BP in these columns.  Consider starting 81mg  of enteric-coated aspirin daily (due to the atherosclerosis, for stroke prevention). Stop if you have bleeding or side effects (stomach pain, etc).  Low-Sodium Eating Plan Sodium raises blood pressure and causes water to be held in the body. Getting less sodium from food will help lower your blood pressure, reduce any swelling, and protect your heart, liver, and kidneys. We get sodium by adding salt (sodium chloride) to food. Most of our sodium comes from canned, boxed, and frozen foods. Restaurant foods, fast foods, and pizza are also very high in sodium. Even if you take medicine to lower your blood pressure or to reduce fluid in your body, getting less sodium from your food is important. WHAT IS MY PLAN? Most people should limit their sodium intake to 2,300 mg a day. Your health care provider recommends that you limit your sodium intake to __________ a day.  WHAT DO I NEED TO KNOW ABOUT THIS EATING PLAN? For the low-sodium eating plan, you will follow these general guidelines:  Choose foods with a % Daily Value for sodium of less than 5% (as listed on the food label).   Use salt-free seasonings or herbs instead of table salt or sea salt.   Check with your health care provider or pharmacist before using salt substitutes.   Eat fresh foods.  Eat more vegetables and fruits.  Limit canned vegetables. If you do use them, rinse them well to decrease the sodium.   Limit cheese to 1 oz (28 g) per day.   Eat lower-sodium products, often labeled as "lower sodium" or "no salt  added."  Avoid foods that contain monosodium glutamate (MSG). MSG is sometimes added to [place] food and some canned foods.  Check food labels (Nutrition Facts labels) on foods to learn how much sodium is in one serving.  Eat more home-cooked food and less restaurant, buffet, and fast food.  When eating at a restaurant, ask that your food be prepared with less salt, or no salt if possible.  HOW DO I READ FOOD LABELS FOR SODIUM INFORMATION? The Nutrition Facts label lists the amount of sodium in one serving of the food. If you eat more than one serving, you must multiply the listed amount of sodium by the number of servings. Food labels may also identify foods as:  Sodium free--Less than 5 mg in a serving.  Very low sodium--35 mg or less in a serving.  Low sodium--140 mg or less in a serving.  Light in sodium--50% less sodium in a serving. For example, if a food that usually has 300 mg of sodium is changed to become light in sodium, it will have 150 mg of sodium.  Reduced sodium--25% less sodium in a serving. For example, if a food that usually has 400 mg of sodium is changed to reduced sodium, it will have 300 mg of sodium. WHAT FOODS CAN I EAT? Grains Low-sodium cereals, including oats, puffed wheat and rice, and shredded wheat cereals. Low-sodium crackers. Unsalted rice and pasta. Lower-sodium bread.  Vegetables Frozen or fresh vegetables. Low-sodium  or reduced-sodium canned vegetables. Low-sodium or reduced-sodium tomato sauce and paste. Low-sodium or reduced-sodium tomato and vegetable juices.  Fruits Fresh, frozen, and canned fruit. Fruit juice.  Meat and Other Protein Products Low-sodium canned tuna and salmon. Fresh or frozen meat, poultry, seafood, and fish. Lamb. Unsalted nuts. Dried beans, peas, and lentils without added salt. Unsalted canned beans. Homemade soups without salt. Eggs.  Dairy Milk. Soy milk. Ricotta cheese. Low-sodium or reduced-sodium cheeses.  Yogurt.  Condiments Fresh and dried herbs and spices. Salt-free seasonings. Onion and garlic powders. Low-sodium varieties of mustard and ketchup. Fresh or refrigerated horseradish. Lemon juice.  Fats and Oils Reduced-sodium salad dressings. Unsalted butter.  Other Unsalted popcorn and pretzels.  The items listed above may not be a complete list of recommended foods or beverages. Contact your dietitian for more options. WHAT FOODS ARE NOT RECOMMENDED? Grains Instant hot cereals. Bread stuffing, pancake, and biscuit mixes. Croutons. Seasoned rice or pasta mixes. Noodle soup cups. Boxed or frozen macaroni and cheese. Self-rising flour. Regular salted crackers. Vegetables Regular canned vegetables. Regular canned tomato sauce and paste. Regular tomato and vegetable juices. Frozen vegetables in sauces. Salted [place] fries. Olives. Angie Fava. Relishes. Sauerkraut. Salsa. Meat and Other Protein Products Salted, canned, smoked, spiced, or pickled meats, seafood, or fish. Bacon, ham, sausage, hot dogs, corned beef, chipped beef, and packaged luncheon meats. Salt pork. Jerky. Pickled herring. Anchovies, regular canned tuna, and sardines. Salted nuts. Dairy Processed cheese and cheese spreads. Cheese curds. Blue cheese and cottage cheese. Buttermilk.  Condiments Onion and garlic salt, seasoned salt, table salt, and sea salt. Canned and packaged gravies. Worcestershire sauce. Tartar sauce. Barbecue sauce. Teriyaki sauce. Soy sauce, including reduced sodium. Steak sauce. Fish sauce. Oyster sauce. Cocktail sauce. Horseradish that you find on the shelf. Regular ketchup and mustard. Meat flavorings and tenderizers. Bouillon cubes. Hot sauce. Tabasco sauce. Marinades. Taco seasonings. Relishes. Fats and Oils Regular salad dressings. Salted butter. Margarine. Ghee. Bacon fat.  Other Potato and tortilla chips. Corn chips and puffs. Salted popcorn and pretzels. Canned or dried soups. Pizza. Frozen  entrees and pot pies.  The items listed above may not be a complete list of foods and beverages to avoid. Contact your dietitian for more information.   This information is not intended to replace advice given to you by your health care provider. Make sure you discuss any questions you have with your health care provider.   Document Released: 10/11/2001 Document Revised: 05/12/2014 Document Reviewed: 02/23/2013 Elsevier Interactive Patient Education 2016 Elsevier Inc.  Atherosclerosis Atherosclerosis, or hardening of the arteries, is the buildup of plaque within the major arteries in the body. Plaque is made up of fats (lipids), cholesterol, calcium, and fibrous tissue. Plaque can narrow or block blood flow within an artery. Plaque can break off and cause damage to the affected organ. Plaque can also "rupture." When plaque ruptures within an artery, a clot can form, causing a sudden (acute) blockage of the artery. Untreated atherosclerosis can cause serious health problems or death.  RISK FACTORS  High cholesterol levels.  Smoking.  Obesity.  Lack of activity or exercise.  Eating a diet high in saturated fat.  Family history.  Diabetes. SIGNS AND SYMPTOMS  Symptoms of atherosclerosis can occur when blood flow to an artery is slowed or blocked. Severity and onset of symptoms depends on how extensive the narrowing or blockage is. A sudden plaque rupture can bring immediate, life-threatening symptoms. Atherosclerosis can affect different arteries in the body, for example:  Coronary arteries. The  coronary arteries supply the heart with blood. When the coronary arteries are narrowed or blocked from atherosclerosis, this is known as coronary artery disease (CAD). CAD can cause a heart attack. Common heart attack symptoms include:  Chest pain or pain that radiates to the neck, arm, jaw, or in the upper, middle back (mid-scapular pain).  Shortness of breath without cause.  Profuse  sweating while at rest.  Irregular heartbeats.  Nausea or gastrointestinal upset.  Carotid arteries. The carotid arteries supply the brain with blood. They are located on each side of your neck. When blood flow to these arteries is slowed or blocked, a transient ischemic attack (TIA) or stroke can occur. A TIA is considered a "mini-stroke" or "warning stroke." TIA symptoms are the same as stroke symptoms, but they are temporary and last less than 24 hours. A stroke can cause permanent damage or death. Common TIA and stroke symptoms include:  Sudden numbness or weakness to one side of your body, such as the face, arm, or leg.  Sudden confusion or trouble speaking or understanding.  Sudden trouble seeing out of one or both eyes.  Sudden trouble walking, loss of balance, or dizziness.  Sudden, severe headache with no known cause.  Arteries in the legs. When arteries in the lower legs become narrowed or blocked, this is known as peripheral vascular disease (PVD). PVD can cause a symptom called claudication. Claudication is pain or a burning feeling in your legs when walking or exercising and usually goes away with rest. Very severe PVD can cause pain in your legs while at rest.  Renal arteries. The renal arteries supply the kidneys with blood. Blockage of the renal arteries can cause a decline in kidney function or high blood pressure (hypertension).  Gastrointestinal arteries (mesenteric circulation). Abdominal pain may occur after eating. DIAGNOSIS  Your health care provider may perform the following tests to diagnose atherosclerosis:  Blood tests.  Stress test.  Echocardiogram.  Nuclear scan.  Ankle/brachial index.  Ultrasonography.  Computed tomography (CT) scan.  Angiography. TREATMENT  Atherosclerosis treatment includes the following:  Lifestyle changes such as:  Quitting smoking. Your health care provider can help you with smoking cessation.  Eating a diet low in  saturated fat. A registered dietitian can educate you on healthy food options, such as helping you understand the difference between good fat and bad fat.  Following an exercise program approved by your health care provider.  Maintaining a healthy weight. Lose weight as approved by your health care provider.  Have your cholesterol levels checked as directed by your health care provider.  Medicines. Cholesterol medicines can help slow or stop the progression of atherosclerosis.  Different procedural or surgical interventions to treat atherosclerosis include:  Balloon angioplasty. The technical name for balloon angioplasty is percutaneous transluminal angioplasty (PTA). In this procedure, a catheter with a small balloon at the tip is inserted through the blocked or narrowed artery. The balloon is then inflated. When the balloon is inflated, the fatty plaque is compressed against the artery wall, allowing better blood flow within the artery.  Balloon angioplasty and stenting. In this procedure, balloon angioplasty is combined with a stenting procedure. A stent is a small, metal mesh tube that keeps the artery open. After the artery is opened up by the balloon technique, the stent is then deployed. The stent is permanent.  Open heart surgery or bypass surgery. To perform this type of surgery, a healthy vessel is first "harvested" from either the leg or arm. The  harvested vessel is then used to "bypass" the blocked atherosclerotic vessel so new blood flow can be established.  Atherectomy. Atherectomy is a procedure that uses a catheter with a sharp blade to remove plaque from an artery. A chamber in the catheter collects the plaque.  Endarterectomy. An endarterectomy is a surgical procedure where a surgeon removes plaque from an artery.  Amputation. When blockages in the lower legs are very severe and circulation cannot be restored, amputation may be required. SEEK IMMEDIATE MEDICAL CARE IF:  You  are having heart attack symptoms, such as:  Chest pain or pain that radiates to the neck, arm, jaw, or in the upper, middle back (mid-scapular pain).  Shortness of breath without cause.  Profuse sweating while at rest.  Irregular heartbeats.  Nausea or gastrointestinal upset.  You are having stroke symptoms, such as sudden:  Numbness or weakness to one side of your body, such as the face, arm, or leg.  Confusion or trouble speaking or understanding.  Trouble seeing out of one or both eyes.  Trouble walking, loss of balance, or dizziness.  Severe headache with no known cause.  Your hands or feet are bluish, cold, or you have pain in them.  You have bad abdominal pain after eating. Symptoms of heart attack or stroke may represent a serious problem that is an emergency. Do not wait to see if the symptoms will go away. Get medical help right away. Call your local emergency services (911 in the [place] Do not drive yourself to the hospital.   This information is not intended to replace advice given to you by your health care provider. Make sure you discuss any questions you have with your health care provider.   Document Released: 07/12/2003 Document Revised: 05/12/2014 Document Reviewed: 06/24/2011 Elsevier Interactive Patient Education [employer].

## 2015-03-14 NOTE — Progress Notes (Signed)
Chief Complaint  Patient presents with  . Hypertension    follow up from ED visit in [place] this past Monday (brought in paperwork). Seen for high blood pressure and UTI.    She had been playing with her grandchildren all day in [place] on Monday, 11/7.  She had developed a sore spot on her right temple, with some sharp pains, then some sharp pains on the left side of her head.  She doesn't usually get headaches, it worried her.  When her son came home that evening, she checked her BP and it was 210/99. She went to the ER (where it was even higher).  After some observation, BP did not improve.  She was told her blood tests were normal, but urine showed UTI. She also had CXR, EKG, CT without contrast, and CT angio of head and neck due to concern for possible aneurysm. Records reviewed in Care Everywhere CT angio head/neck: IMPRESSION: 1.  No evidence of intracranial aneurysm. 2.  No evidence of cervicocerebral arterial dissection. 3.  Mild atherosclerosis of bilateral carotid bifurcations without significant proximal ICA stenosis.  CT head without contrast was normal. CXR: no active disease; top normal cardiac size noted. EKG: Diagnosis Normal sinus rhythm Minimal voltage criteria for LVH, may be normal variant Borderline ECG No previous ECGs available  Urinalysis: mod blood, nitrite+, large leuks, mod bacteria. Many WBC's and RBC's seen. It doesn't appear that culture was sent. Negative troponin CBC: normal WBC, Hg 11.6 Chem: normal  She was started on HCTZ 12.5mg  for the blood pressure (taking 1/2 tablet of 25mg ), and Cipro to treat UTI, and told to follow up here. She denies ever having any dysuria,  (urgency and frequency is per her norm, unchanged), denies abdominal pain, flank pain, hematuria.  She started taking the HCTZ yesterday, and also took a dose this morning.  She does note increased urinary frequency, otherwise no side effects. No muscle cramps.  This morning, with her  son's wrist cuff, BP 189/90. She had slight posterior head discomfort during the night. Yesterday BP got down to 154/79.  She has a dull headache currently.  She feel tired, but otherwise no pain. Always under stress, no different than usual.  She had been with her almost 3 and 40 yo grandchildren, and just being at her son's house stresses her out some. (they also have a 66 month old baby--held her most of the day) +salted popcorn 2 days ago. She normally tries to avoid salt, doesn't add.  PMH, [place] SH reviewed.  Outpatient Encounter Prescriptions as of 03/14/2015  Medication Sig Note  . aspirin 81 MG tablet Take 81 mg by mouth daily.     Marland Kitchen buPROPion (WELLBUTRIN XL) 300 MG 24 hr tablet Take 1 tablet (300 mg total) by mouth daily.   . Cholecalciferol (VITAMIN D) 1000 UNITS capsule Take 1,000 Units by mouth daily.     . ciprofloxacin (CIPRO) 500 MG tablet Take 500 mg by mouth. 03/14/2015: Received from: [facility]  . fish oil-omega-3 fatty acids 1000 MG capsule Take 2 g by mouth daily.     Marland Kitchen FLUoxetine (PROZAC) 40 MG capsule Take 1 capsule (40 mg total) by mouth daily.   . hydrochlorothiazide (HYDRODIURIL) 25 MG tablet Take 12.5 mg by mouth. 03/14/2015: Received from: [place]  . HYDROcodone-acetaminophen (NORCO/VICODIN) 5-325 MG tablet Take 1-2 tablets by mouth. 03/14/2015: Received from: [place]  . Probiotic Product (PROBIOTIC DAILY PO) Take 1 capsule by mouth daily.    Marland Kitchen SYNTHROID  200 MCG tablet TAKE 1 TABLET (200 MCG TOTAL) BY MOUTH DAILY. TAKE 1 TABLET DAILY AS DIRECTED   . cyclobenzaprine (FLEXERIL) 10 MG tablet Take 1 tablet (10 mg total) by mouth 3 (three) times daily as needed for muscle spasms. (Patient not taking: Reported on 11/30/2014)    No facility-administered encounter medications on file as of 03/14/2015.   No Known Allergies  ROS: no fever, chills, URI symptoms, chest pain, palpitations, shortness of breath. Denies dizziness; +headaches as per HPI.  No nausea,  vomiting, heartburn, bowel changes, numbness, tingling, weakness, or other neuro complaints.  No depression, insomnia.  Some stress--finds it stressful watching all 3 grandkids every other weekend.    PHYSICAL EXAM: BP 142/80 mmHg  Pulse 80  Ht 5' 3.5" (1.613 m)  Wt 223 lb (101.152 kg)  BMI 38.88 kg/m2  LMP 05/05/1990  150/74 on repeat by MD Pleasant, somewhat anxious yet appreciative obese female in no distress HEENT: PERRL, EOMI, conjunctiva clear. Mucus membranes moist, normal OP Neck : no lymphadenopathy, thyromegaly, mass or bruit Heart: regular rate and rhythm, with 3/6 SEM at LUSB, unchanged from prior exams Lungs: clear bilaterally Back: no spinal or CVA tenderness Abdomen: soft, nontender, no organomegaly or mass Extremities: no edema, normal pulses.  Wearing compression stockings Psych: normal mood, affect, hygiene and grooming Neuro: alert and oriented. Normal cranial nerves, strength, sensation, gait  ASSESSMENT/PLAN:  ATHEROSCLEROSIS carotid bifurcations  Essential hypertension - low sodium diet reviewed; daily exercise, wt loss; monitor BP and f/u 2 wks  Need for prophylactic vaccination and inoculation against influenza - Plan: Flu vaccine HIGH DOSE PF (Fluzone High dose)  Atherosclerosis of both carotid arteries - discussed what this means, controlling risks; start ASA 81mg   Acute cystitis without hematuria - complete course of Cipro. recheck urine at f/u isit  Continue the 12.5 mg (1/2 tablet) of HCTZ.  Try and follow a low sodium diet (see info).  Try and check your blood pressure elsewhere--since you have a monitor, be sure to bring it with you to your next visit.  Write down the values and keep a chart with the following columns: Date/morning/evening/comments.  You can also keep track of the pulse where you write down the BP in these columns.  Consider starting 81mg  of enteric-coated aspirin daily (due to the atherosclerosis, for stroke prevention). Stop if  you have bleeding or side effects (stomach pain, etc).   Return in 2 weeks, with your blood pressure monitor and log. Will recheck urine at that time as well  AWV/med check in 6 months

## 2015-03-15 ENCOUNTER — Telehealth: Payer: Self-pay | Admitting: Family Medicine

## 2015-03-15 MED ORDER — CIPROFLOXACIN HCL 500 MG PO TABS: 500.0000 mg | ORAL_TABLET | Freq: Two times a day (BID) | ORAL | Status: AC

## 2015-03-15 MED ORDER — HYDROCHLOROTHIAZIDE 25 MG PO TABS: 12.5000 mg | ORAL_TABLET | Freq: Every day | ORAL | Status: DC

## 2015-03-15 NOTE — Telephone Encounter (Signed)
Sending for Cipro #8, as she took #2 already.

## 2015-03-15 NOTE — Telephone Encounter (Signed)
Yes--#15 of the HCTZ is what was originally written. And originally written for #10 of the cipro--see how many doses she already took and rx the balance of the 5 day course

## 2015-03-15 NOTE — Telephone Encounter (Signed)
Pt left her medication bag in [place]. She needs meds sent into pharmacy to replace. She needs Cipro and bp meds sent to [name] on [address]. Pt can be reached at [phone].

## 2015-03-15 NOTE — Telephone Encounter (Signed)
Is this okay?

## 2015-03-21 DIAGNOSIS — S8012XD Contusion of left lower leg, subsequent encounter: Secondary | ICD-10-CM | POA: Diagnosis not present

## 2015-03-21 DIAGNOSIS — L03116 Cellulitis of left lower limb: Secondary | ICD-10-CM | POA: Diagnosis not present

## 2015-03-26 ENCOUNTER — Other Ambulatory Visit: Payer: Self-pay | Admitting: Family Medicine

## 2015-03-28 ENCOUNTER — Ambulatory Visit: Payer: Medicare Other | Admitting: Family Medicine

## 2015-04-02 ENCOUNTER — Ambulatory Visit: Payer: Medicare Other | Admitting: Family Medicine

## 2015-04-05 ENCOUNTER — Ambulatory Visit: Payer: Self-pay | Admitting: Family Medicine

## 2015-04-11 ENCOUNTER — Ambulatory Visit: Payer: Medicare Other | Admitting: Family Medicine

## 2015-04-16 ENCOUNTER — Ambulatory Visit (INDEPENDENT_AMBULATORY_CARE_PROVIDER_SITE_OTHER): Payer: Medicare Other | Admitting: Family Medicine

## 2015-04-16 ENCOUNTER — Encounter: Payer: Self-pay | Admitting: Family Medicine

## 2015-04-16 VITALS — BP 152/82 | HR 72 | Ht 63.5 in | Wt 234.0 lb

## 2015-04-16 DIAGNOSIS — I10 Essential (primary) hypertension: Secondary | ICD-10-CM | POA: Diagnosis not present

## 2015-04-16 DIAGNOSIS — I6523 Occlusion and stenosis of bilateral carotid arteries: Secondary | ICD-10-CM

## 2015-04-16 DIAGNOSIS — R300 Dysuria: Secondary | ICD-10-CM | POA: Diagnosis not present

## 2015-04-16 DIAGNOSIS — Z5181 Encounter for therapeutic drug level monitoring: Secondary | ICD-10-CM

## 2015-04-16 LAB — POCT URINALYSIS DIPSTICK
Bilirubin, UA: NEGATIVE
GLUCOSE UA: NEGATIVE
Ketones, UA: NEGATIVE
Leukocytes, UA: NEGATIVE
Nitrite, UA: NEGATIVE
PROTEIN UA: NEGATIVE
SPEC GRAV UA: 1.025
UROBILINOGEN UA: NEGATIVE
pH, UA: 6.5

## 2015-04-16 LAB — BASIC METABOLIC PANEL
BUN: 15 mg/dL (ref 7–25)
CALCIUM: 9.2 mg/dL (ref 8.6–10.4)
CO2: 31 mmol/L (ref 20–31)
CREATININE: 0.45 mg/dL — AB (ref 0.50–0.99)
Chloride: 101 mmol/L (ref 98–110)
Glucose, Bld: 80 mg/dL (ref 65–99)
Potassium: 4.6 mmol/L (ref 3.5–5.3)
Sodium: 138 mmol/L (ref 135–146)

## 2015-04-16 NOTE — Progress Notes (Signed)
Chief Complaint  Patient presents with  . Hypertension    follow up bp.   . Medical Clearance    brought in form for clearance from [place] for right knee, total (not yet scheduled).   Patient presents for follow up on hypertension.  She continues to take 12.49m of HCTZ (1/2 tablet of 297m. BP's have been running 150's/70's-80's at the pharmacy, but much higher on her wrist cuff (she doesn't think it is accurate).  She was started on HCTZ 12.68m17mn the ER a month ago.  She was seen in f/u here, and was told to continue that dose, and follow up again in 2 weeks. We had discussed low sodium diet--she has cut out the salt from her diet. We also discussed starting 55m1m aspirin, which she did.  She was also treated for UTI at ER visit.  She is due for urine recheck today. She states she never had typical urinary symptoms with that UTI, and does not complain of any urinary complaints today.  She brings in a form for medical clearance for right total knee replacement by Dr. WhitDurward Forteshe isn't in a rush--plans to do in February or March. Currently she denies any pain (sitting in exam room). Has 5/10 pain with weight-bearing.  She had been off of her probiotic for a while, she had some recurrent LLQ pain.  She has been back on the Align, and her pain is no longer as severe, and no longer daily.  Improving.  PMH, PSH, SH reviewed  Outpatient Encounter Prescriptions as of 04/16/2015  Medication Sig  . aspirin 81 MG tablet Take 81 mg by mouth daily.    . buMarland KitchenROPion (WELLBUTRIN XL) 300 MG 24 hr tablet Take 1 tablet (300 mg total) by mouth daily.  . Cholecalciferol (VITAMIN D) 1000 UNITS capsule Take 1,000 Units by mouth daily.    . fish oil-omega-3 fatty acids 1000 MG capsule Take 2 g by mouth daily.    . FLMarland Kitchenoxetine (PROZAC) 40 MG capsule Take 1 capsule (40 mg total) by mouth daily.  . hydrochlorothiazide (HYDRODIURIL) 25 MG tablet Take 0.5 tablets (12.5 mg total) by mouth daily.  .  Probiotic Product (PROBIOTIC DAILY PO) Take 1 capsule by mouth daily.   . SYMarland KitchenTHROID 200 MCG tablet TAKE 1 TABLET (200 MCG TOTAL) BY MOUTH DAILY AS DIRECTED  . cyclobenzaprine (FLEXERIL) 10 MG tablet Take 1 tablet (10 mg total) by mouth 3 (three) times daily as needed for muscle spasms. (Patient not taking: Reported on 11/30/2014)   No facility-administered encounter medications on file as of 04/16/2015.   No Known Allergies  ROS: no fever, chills, headaches, dizziness, leg cramps, edema, URI symptoms, chest pain, shortness of breath, bleeding, bruising.  GI complaints as per HPI.  No urinary complaints.  PHYSICAL EXAM: BP 152/82 mmHg  Pulse 72  Ht 5' 3.5" (1.613 m)  Wt 234 lb (106.142 kg)  BMI 40.80 kg/m2  LMP 05/05/1990  Repeat by nurse was 148/80; pt's machine 176/99 (not accurate).  Repeat by MD 160/86 (right arm, large cuff).  Well developed, pleasant female,  In a rush, answering phone, in no distress Neck: no lymphadenopathy or mass Heart: regular rate and rhythm, with 3/6 SEM at LUSB, unchanged from prior exams Lungs: clear bilaterally Back: no spinal or CVA tenderness Abdomen: soft, nontender, no organomegaly or mass Extremities: no edema, normal pulses. Wearing compression stocking on RLE not left, due to recent rash which is resolving Psych: normal mood, affect, hygiene and  grooming Neuro: alert and oriented. Normal cranial nerves, strength, sensation, gait  Urine dip: trace blood, otherwise normal  ASSESSMENT/PLAN:  Essential hypertension - suboptimally controlled; increase HCTZ to 15m. Cont low sodium diet, rec daily exercise, weight loss. f/u 2-3 wks  Medication monitoring encounter - Plan: Basic metabolic panel  Dysuria - recent UTI, asymptomatic.  Recheck today is normal - Plan: POCT Urinalysis Dipstick    Increase your dose to the full HCTZ tablet once daily. Continue to monitor your blood pressure regularly--your monitor is not accurate, but the readings  from the pharmacy are about the same as what we get in the office. Try and get 150 minutes of aerobic exercise each week. Try and continue to work on weight loss. Continue the aspirin.  b-met today Recheck urine given lack of symptoms with last UTI  Encouraged exercise Discussed which ones would be okay for her knee.  We did not address medical clearance today.  Will discuss at f/u, when BP is under better control--she was in a rush today.  Forms will be kept, and address at next visit.

## 2015-04-16 NOTE — Patient Instructions (Signed)
   Increase your dose to the full HCTZ tablet once daily. Continue to monitor your blood pressure regularly--your monitor is not accurate, but the readings from the pharmacy are about the same as what we get in the office. Try and get 150 minutes of aerobic exercise each week. Try and continue to work on weight loss. Continue the aspirin.

## 2015-04-17 DIAGNOSIS — I10 Essential (primary) hypertension: Secondary | ICD-10-CM | POA: Insufficient documentation

## 2015-04-24 ENCOUNTER — Telehealth: Payer: Self-pay | Admitting: Family Medicine

## 2015-04-24 DIAGNOSIS — I10 Essential (primary) hypertension: Secondary | ICD-10-CM

## 2015-04-24 MED ORDER — HYDROCHLOROTHIAZIDE 25 MG PO TABS: 25.0000 mg | ORAL_TABLET | Freq: Every day | ORAL | Status: DC

## 2015-04-24 NOTE — Telephone Encounter (Signed)
Pt called for refill of hydrochlorothiazide. This is for a new dose. Per pt dose was increased to 1 a day instead of 1/2 day. Please send to [facility]. Pt can be reached at [phone].

## 2015-04-24 NOTE — Telephone Encounter (Signed)
Rx was refilled.  It doesn't look like she scheduled her f/u (she was in a rush when leaving her last visit).  Please have her schedule f/u visit on BP (and to complete her surgical clearance form) in early January.

## 2015-04-24 NOTE — Telephone Encounter (Signed)
done

## 2015-04-25 ENCOUNTER — Other Ambulatory Visit: Payer: Self-pay | Admitting: Family Medicine

## 2015-04-27 DIAGNOSIS — M170 Bilateral primary osteoarthritis of knee: Secondary | ICD-10-CM | POA: Diagnosis not present

## 2015-04-27 DIAGNOSIS — R262 Difficulty in walking, not elsewhere classified: Secondary | ICD-10-CM | POA: Diagnosis not present

## 2015-05-02 ENCOUNTER — Telehealth: Payer: Self-pay | Admitting: Family Medicine

## 2015-05-02 MED ORDER — AMOXICILLIN 875 MG PO TABS: 875.0000 mg | ORAL_TABLET | Freq: Two times a day (BID) | ORAL | Status: DC

## 2015-05-02 NOTE — Telephone Encounter (Signed)
Please call  Katie Kaiser and she has bad sore throat that is getting worse each day. She will not be home from [place] until  Saturday and she wants to talk to you to see if she can get RX  She states that she has called medical offices [place] the area and they wont see her because she only has her Medicare # and not the card. I did suggest she try an Urgent Care [place]  But she wanted me to send message to you

## 2015-05-02 NOTE — Telephone Encounter (Signed)
Patient states she has had fever, low grade, is miserable. Has been around a lot of children but doesn't know if any have had strep. She would like an antibiotic called to [facility] [address] [place]. She has done all the OTC measures suggested.

## 2015-05-02 NOTE — Telephone Encounter (Signed)
Ok to treat presumptively, given lack of congestion, cough.  Needs to f/u if not better. Advise pt rx was sent to the [name] in [place] on [facility]

## 2015-05-02 NOTE — Telephone Encounter (Signed)
I would only feel good about rx'ing antibiotics for sore throat if she has fever or known strep exposure.  Sore throat are often related to postnasal drainage, often from viruses, and supportive measures is the answer--salt water gargles, tylenol or ibuprofen for pain, chloraseptic spray, and treating any congestion if she has any runny nose or postnasal drainage, that can make the throat sore.  If not having fever or known strep exposure, then treat supportively and come here Monday. Or go to UC before then, if needed.

## 2015-05-03 NOTE — Telephone Encounter (Signed)
Pt informed

## 2015-05-03 NOTE — Telephone Encounter (Signed)
See msg

## 2015-05-09 ENCOUNTER — Ambulatory Visit: Payer: Medicare Other | Admitting: Family Medicine

## 2015-05-14 ENCOUNTER — Ambulatory Visit: Payer: Medicare Other | Admitting: Family Medicine

## 2015-05-21 ENCOUNTER — Telehealth: Payer: Self-pay

## 2015-05-21 ENCOUNTER — Encounter: Payer: Self-pay | Admitting: Family Medicine

## 2015-05-21 ENCOUNTER — Telehealth: Payer: Self-pay | Admitting: Internal Medicine

## 2015-05-21 ENCOUNTER — Ambulatory Visit
Admission: RE | Admit: 2015-05-21 | Discharge: 2015-05-21 | Disposition: A | Discharge status: Home / Self Care | Payer: Medicare Other | Source: Ambulatory Visit | Attending: Family Medicine | Admitting: Family Medicine

## 2015-05-21 ENCOUNTER — Ambulatory Visit (INDEPENDENT_AMBULATORY_CARE_PROVIDER_SITE_OTHER): Payer: Medicare Other | Admitting: Family Medicine

## 2015-05-21 VITALS — BP 148/76 | HR 68 | Temp 102.5°F | Ht 63.5 in | Wt 225.0 lb

## 2015-05-21 DIAGNOSIS — R05 Cough: Secondary | ICD-10-CM

## 2015-05-21 DIAGNOSIS — R059 Cough, unspecified: Secondary | ICD-10-CM

## 2015-05-21 DIAGNOSIS — R6883 Chills (without fever): Secondary | ICD-10-CM | POA: Diagnosis not present

## 2015-05-21 DIAGNOSIS — R197 Diarrhea, unspecified: Secondary | ICD-10-CM | POA: Diagnosis not present

## 2015-05-21 DIAGNOSIS — R509 Fever, unspecified: Secondary | ICD-10-CM

## 2015-05-21 LAB — CBC WITH DIFFERENTIAL/PLATELET
BASOS ABS: 0 10*3/uL (ref 0.0–0.1)
BASOS PCT: 0 % (ref 0–1)
EOS PCT: 0 % (ref 0–5)
Eosinophils Absolute: 0 10*3/uL (ref 0.0–0.7)
HEMATOCRIT: 32.8 % — AB (ref 36.0–46.0)
Hemoglobin: 11.3 g/dL — ABNORMAL LOW (ref 12.0–15.0)
LYMPHS PCT: 11 % — AB (ref 12–46)
Lymphs Abs: 1.1 10*3/uL (ref 0.7–4.0)
MCH: 26.7 pg (ref 26.0–34.0)
MCHC: 34.5 g/dL (ref 30.0–36.0)
MCV: 77.4 fL — ABNORMAL LOW (ref 78.0–100.0)
MONO ABS: 0.8 10*3/uL (ref 0.1–1.0)
MPV: 8.7 fL (ref 8.6–12.4)
Monocytes Relative: 8 % (ref 3–12)
Neutro Abs: 8.3 10*3/uL — ABNORMAL HIGH (ref 1.7–7.7)
Neutrophils Relative %: 81 % — ABNORMAL HIGH (ref 43–77)
Platelets: 192 10*3/uL (ref 150–400)
RBC: 4.24 MIL/uL (ref 3.87–5.11)
RDW: 14.7 % (ref 11.5–15.5)
WBC: 10.3 10*3/uL (ref 4.0–10.5)

## 2015-05-21 LAB — BASIC METABOLIC PANEL
BUN: 13 mg/dL (ref 7–25)
CO2: 23 mmol/L (ref 20–31)
Calcium: 8.6 mg/dL (ref 8.6–10.4)
Chloride: 98 mmol/L (ref 98–110)
Creat: 0.71 mg/dL (ref 0.50–0.99)
Glucose, Bld: 115 mg/dL — ABNORMAL HIGH (ref 65–99)
POTASSIUM: 4 mmol/L (ref 3.5–5.3)
Sodium: 132 mmol/L — ABNORMAL LOW (ref 135–146)

## 2015-05-21 LAB — POC INFLUENZA A&B (BINAX/QUICKVUE)
Influenza A, POC: NEGATIVE
Influenza B, POC: NEGATIVE

## 2015-05-21 MED ORDER — ALBUTEROL SULFATE HFA 108 (90 BASE) MCG/ACT IN AERS: 2.0000 | INHALATION_SPRAY | Freq: Four times a day (QID) | RESPIRATORY_TRACT | Status: DC | PRN

## 2015-05-21 MED ORDER — BENZONATATE 200 MG PO CAPS: 200.0000 mg | ORAL_CAPSULE | Freq: Two times a day (BID) | ORAL | Status: DC | PRN

## 2015-05-21 MED ORDER — LEVOFLOXACIN 750 MG PO TABS: 750.0000 mg | ORAL_TABLET | Freq: Every day | ORAL | Status: DC

## 2015-05-21 NOTE — Telephone Encounter (Signed)
She can just see Dr. Tomi Bamberger on Wednesday. She will not need to schedule an appointment with me also.

## 2015-05-21 NOTE — Telephone Encounter (Signed)
Sent med into pharmacy 

## 2015-05-21 NOTE — Telephone Encounter (Signed)
Pt was here todday to see you. Pt was instructed to f/u in 2 days (Wendesday). How do we go about doing this if she also already has an appt with Dr. Tomi Bamberger on Wednesday? Can we do Thursday for the f/u?

## 2015-05-21 NOTE — Telephone Encounter (Signed)
-----   Message from Girtha Rm, NP sent at 05/21/2015  2:54 PM EST ----- Please call in Levaquin 750 mg 1 pill daily for 5 days and an albuterol inhaler. Thanks.

## 2015-05-21 NOTE — Progress Notes (Signed)
Subjective:    Patient ID: Katie Kaiser, female    DOB: 06/19/1945, 70 y.o.   MRN: [id number]  HPI Chief Complaint  Patient presents with  . Cough    fever, chill, diarrhea and vomiting that started this past Friday night.   She is here with symptoms of cough, sometimes productive, fever, chills, bodyaches and diarrhea. She reports diarrhea does seem to be improving with 2 loose brown stools today and 2 -3 loose or watery stools per days over the weekend. Also reports 2 episodes of vomiting Friday night and Saturday but none today or yesterday.  States she took Amoxicillin for possible strep throat about 3 weeks ago finished this about a week ago.    States she is urinating adequately and is staying hydrated, drinking plenty of ginger ale, pedialyte and orange juice and keeping fluids down.  She ate this morning, homemade vegetable soup and it has stayed down.   Denies chest pain, palpitations, shortness of breath, abdominal pain or back pain. She no longer has nausea.   Past Medical History  Diagnosis Date  . Hypothyroid   . Depression   . Impaired fasting glucose 5/07  . Obesity   . Vitamin D deficiency   . Diverticulosis    Past Surgical History  Procedure Laterality Date  . Thyroidectomy  1975  . Tonsillectomy    . Appendectomy    . Cesarean section    . Orif forearm fracture  2002  . Colonoscopy  02/04/2011    diverticulosis        Review of Systems Pertinent positives and negatives in the history of present illness.    Objective:   Physical Exam  Constitutional: She is oriented to person, place, and time. She appears well-developed and well-nourished. No distress.  HENT:  Mouth/Throat: Uvula is midline, oropharynx is clear and moist and mucous membranes are normal. No oropharyngeal exudate or posterior oropharyngeal edema.  Neck: Normal range of motion and full passive range of motion without pain. Neck supple.  Cardiovascular: Normal rate, regular  rhythm, normal heart sounds and intact distal pulses.   Pulmonary/Chest: Effort normal. She has decreased breath sounds in the left lower field. She has no wheezes. She has no rhonchi.  Abdominal: Soft. Normal appearance and bowel sounds are normal. There is no hepatosplenomegaly. There is no tenderness. There is no rigidity, no rebound, no guarding, no CVA tenderness, no tenderness at McBurney's point and negative Murphy's sign.  Lymphadenopathy:    She has no cervical adenopathy.  Neurological: She is alert and oriented to person, place, and time. She has normal strength. No cranial nerve deficit or sensory deficit. Gait normal.  Skin: Skin is warm and dry. No rash noted. No cyanosis. No pallor.  Psychiatric: She has a normal mood and affect. Her speech is normal and behavior is normal. Judgment and thought content normal. Cognition and memory are normal.   BP 148/76 mmHg  Pulse 68  Temp(Src) 102.5 F (39.2 C) (Tympanic)  Ht 5' 3.5" (1.613 m)  Wt 225 lb (102.059 kg)  BMI 39.23 kg/m2  LMP 05/05/1990      Assessment & Plan:  Fever, unspecified - Plan: POC Influenza A&B (Binax test), CBC with Differential/Platelet, Basic metabolic panel, DG Chest 2 View  Cough - Plan: POC Influenza A&B (Binax test), CBC with Differential/Platelet, DG Chest 2 View  Chills - Plan: POC Influenza A&B (Binax test)  Diarrhea, unspecified type - Plan: Basic metabolic panel  Discussed possible etiologies for fever.  High on the list is possbile upper respiratory infection, will rule out pneumonia with chest XR.   Low on the list is GI etiology such as infectious diarrhea. N/V/D symptoms are all improving. Abdominal exam unremarkable.  No UTI symptoms. Will get labs and XR results are follow up.  Tessalon pears prescribed for cough. Discussed importance of staying well hydrated. She may take Tylenol or ibuprofen for fever. Will consider appropriate antibiotic therapy once x-ray has resulted. Recommend she  follow-up in 1-2 days she is not improving.

## 2015-05-23 ENCOUNTER — Ambulatory Visit: Payer: Medicare Other | Admitting: Family Medicine

## 2015-05-26 ENCOUNTER — Other Ambulatory Visit: Payer: Self-pay | Admitting: Family Medicine

## 2015-05-29 ENCOUNTER — Telehealth: Payer: Self-pay

## 2015-05-29 NOTE — Telephone Encounter (Signed)
Katie Kaiser sent a refill request for Hydrochorothiazide 25mg 

## 2015-05-30 ENCOUNTER — Encounter: Payer: Self-pay | Admitting: Family Medicine

## 2015-05-30 ENCOUNTER — Ambulatory Visit (INDEPENDENT_AMBULATORY_CARE_PROVIDER_SITE_OTHER): Payer: Medicare Other | Admitting: Family Medicine

## 2015-05-30 VITALS — BP 142/70 | HR 72 | Ht 63.5 in | Wt 225.4 lb

## 2015-05-30 DIAGNOSIS — E871 Hypo-osmolality and hyponatremia: Secondary | ICD-10-CM

## 2015-05-30 DIAGNOSIS — R05 Cough: Secondary | ICD-10-CM

## 2015-05-30 DIAGNOSIS — I10 Essential (primary) hypertension: Secondary | ICD-10-CM | POA: Diagnosis not present

## 2015-05-30 DIAGNOSIS — Z5181 Encounter for therapeutic drug level monitoring: Secondary | ICD-10-CM | POA: Diagnosis not present

## 2015-05-30 DIAGNOSIS — R059 Cough, unspecified: Secondary | ICD-10-CM

## 2015-05-30 LAB — BASIC METABOLIC PANEL
BUN: 16 mg/dL (ref 7–25)
CHLORIDE: 100 mmol/L (ref 98–110)
CO2: 27 mmol/L (ref 20–31)
Calcium: 8.8 mg/dL (ref 8.6–10.4)
Creat: 0.57 mg/dL (ref 0.50–0.99)
Glucose, Bld: 87 mg/dL (ref 65–99)
Potassium: 3.7 mmol/L (ref 3.5–5.3)
SODIUM: 135 mmol/L (ref 135–146)

## 2015-05-30 MED ORDER — HYDROCHLOROTHIAZIDE 25 MG PO TABS: 25.0000 mg | ORAL_TABLET | Freq: Every day | ORAL | Status: DC

## 2015-05-30 MED ORDER — BENZONATATE 200 MG PO CAPS: 200.0000 mg | ORAL_CAPSULE | Freq: Two times a day (BID) | ORAL | Status: DC | PRN

## 2015-05-30 NOTE — Patient Instructions (Signed)
Continue your HCTZ 25mg  daily. Try and get regular exercise. Try and check your blood pressure regularly, keep a list and bring it with you to your visit. Return sooner if consistently >140-150/90

## 2015-05-30 NOTE — Progress Notes (Signed)
Chief Complaint  Patient presents with  . Follow-up    on bp and surgical clearance form. Would like to know if you can refill her tessalon rx for the slight cough she still has at night. Needs refill on HCTZ.    She was quite ill when seen by Vickie on 1/16.  CXR showed: Mild left lung base subsegmental atelectasis and or infiltrate cannot be excluded.  She completed course of Levaquin and is feeling much better.  Just a slight residual cough at bedtime.  Asking for tessalon refill, as this works very well.    No fever, shortness of breath, chest pain.  HTN:  HCTZ dose was increased from 12.5-25mg  in mid-December.  Denies any side effects. Denies any muscle cramps or side effects. She is eating 1/2 banana daily. She doesn't check her BP elsewhere.  Her home monitor wasn't accurate.  She had labs done 1/16 due to her illness.     Chemistry      Component Value Date/Time   NA 132* 05/21/2015 1058   K 4.0 05/21/2015 1058   CL 98 05/21/2015 1058   CO2 23 05/21/2015 1058   BUN 13 05/21/2015 1058   CREATININE 0.71 05/21/2015 1058   CREATININE 0.73 10/24/2014 1440      Component Value Date/Time   CALCIUM 8.6 05/21/2015 1058   ALKPHOS 106 10/24/2014 1440   AST 17 10/24/2014 1440   ALT 15 10/24/2014 1440   BILITOT 0.2* 10/24/2014 1440     Lab Results  Component Value Date   WBC 10.3 05/21/2015   HGB 11.3* 05/21/2015   HCT 32.8* 05/21/2015   MCV 77.4* 05/21/2015   PLT 192 05/21/2015    She is planning on waiting until May for her right knee surgery.  PMH, PSH, SH reviewed  Outpatient Encounter Prescriptions as of 05/30/2015  Medication Sig  . albuterol (PROVENTIL HFA;VENTOLIN HFA) 108 (90 Base) MCG/ACT inhaler Inhale 2 puffs into the lungs every 6 (six) hours as needed for wheezing or shortness of breath.  Marland Kitchen aspirin 81 MG tablet Take 81 mg by mouth daily.    Marland Kitchen buPROPion (WELLBUTRIN XL) 300 MG 24 hr tablet Take 1 tablet (300 mg total) by mouth daily.  . Cholecalciferol  (VITAMIN D) 1000 UNITS capsule Take 1,000 Units by mouth daily.    . fish oil-omega-3 fatty acids 1000 MG capsule Take 2 g by mouth daily.    Marland Kitchen FLUoxetine (PROZAC) 40 MG capsule Take 1 capsule (40 mg total) by mouth daily.  . hydrochlorothiazide (HYDRODIURIL) 25 MG tablet Take 1 tablet (25 mg total) by mouth daily.  . Probiotic Product (PROBIOTIC DAILY PO) Take 1 capsule by mouth daily.   Marland Kitchen SYNTHROID 200 MCG tablet TAKE 1 TABLET (200 MCG TOTAL) BY MOUTH DAILY AS DIRECTED  . [DISCONTINUED] hydrochlorothiazide (HYDRODIURIL) 25 MG tablet Take 1 tablet (25 mg total) by mouth daily.  . benzonatate (TESSALON) 200 MG capsule Take 1 capsule (200 mg total) by mouth 2 (two) times daily as needed for cough.  . [DISCONTINUED] benzonatate (TESSALON) 200 MG capsule Take 1 capsule (200 mg total) by mouth 2 (two) times daily as needed for cough. (Patient not taking: Reported on 05/30/2015)  . [DISCONTINUED] cyclobenzaprine (FLEXERIL) 10 MG tablet Take 1 tablet (10 mg total) by mouth 3 (three) times daily as needed for muscle spasms. (Patient not taking: Reported on 05/30/2015)  . [DISCONTINUED] levofloxacin (LEVAQUIN) 750 MG tablet Take 1 tablet (750 mg total) by mouth daily.   No facility-administered encounter  medications on file as of 05/30/2015.   No Known Allergies  ROS: no fever, chills, headache, dizziness, shortness of breath, chest pain, nausea, vomiting, diarrhea, bleeding, bruising, rash or other complaints  PHYSICAL EXAM: BP 142/70 mmHg  Pulse 72  Ht 5' 3.5" (1.613 m)  Wt 225 lb 6.4 oz (102.241 kg)  BMI 39.30 kg/m2  LMP 05/05/1990  146/80 on repeat by MD Well developed, pleasant, obese female in no distress HEENT: PERRL, EOMI, conjunctiva clear. OP clear. Sinuses nontender Neck: no lymphadenopathy or mass Heart: regular rate and rhythm Lungs: clear bilaterally, good air movement Abdomen: soft, nontender Extremities: no edema  ASSESSMENT/PLAN:  Essential hypertension - borderline today;  improved. Cont HCTZ; check BP elsewhere. exercise/wt loss, low Na diet. f/u 3 mos - Plan: hydrochlorothiazide (HYDRODIURIL) 25 MG tablet  Hyponatremia - suspect related to HCTZ, vs to acute illness. repeat today - Plan: Basic metabolic panel  Medication monitoring encounter - Plan: Basic metabolic panel  Cough - significantly improved s/p levaquin - Plan: benzonatate (TESSALON) 200 MG capsule   F/u early April for additional BP check

## 2015-06-26 ENCOUNTER — Other Ambulatory Visit: Payer: Self-pay | Admitting: Family Medicine

## 2015-08-01 DIAGNOSIS — M1712 Unilateral primary osteoarthritis, left knee: Secondary | ICD-10-CM | POA: Diagnosis not present

## 2015-08-08 DIAGNOSIS — M1712 Unilateral primary osteoarthritis, left knee: Secondary | ICD-10-CM | POA: Diagnosis not present

## 2015-08-15 DIAGNOSIS — M1712 Unilateral primary osteoarthritis, left knee: Secondary | ICD-10-CM | POA: Diagnosis not present

## 2015-08-20 ENCOUNTER — Encounter: Payer: Self-pay | Admitting: Family Medicine

## 2015-08-20 ENCOUNTER — Ambulatory Visit (INDEPENDENT_AMBULATORY_CARE_PROVIDER_SITE_OTHER): Payer: Medicare Other | Admitting: Family Medicine

## 2015-08-20 VITALS — BP 152/80 | HR 68 | Temp 97.9°F | Ht 63.5 in | Wt 230.0 lb

## 2015-08-20 DIAGNOSIS — R109 Unspecified abdominal pain: Secondary | ICD-10-CM | POA: Diagnosis not present

## 2015-08-20 DIAGNOSIS — N3000 Acute cystitis without hematuria: Secondary | ICD-10-CM

## 2015-08-20 LAB — POCT URINALYSIS DIPSTICK
BILIRUBIN UA: NEGATIVE
Glucose, UA: NEGATIVE
Ketones, UA: NEGATIVE
NITRITE UA: NEGATIVE
PH UA: 6
Protein, UA: NEGATIVE
RBC UA: NEGATIVE
Spec Grav, UA: >=1.03
Urobilinogen, UA: NEGATIVE

## 2015-08-20 MED ORDER — CIPROFLOXACIN HCL 500 MG PO TABS: 500.0000 mg | ORAL_TABLET | Freq: Two times a day (BID) | ORAL | Status: DC

## 2015-08-20 NOTE — Patient Instructions (Signed)

## 2015-08-20 NOTE — Progress Notes (Signed)
Subjective:  Katie Kaiser is a 70 y.o. female who complains of possible urinary tract infection.  She has had symptoms for 6 days.  Symptoms include left flank pain, urinary frequency, urgency, and feeling like she cannot completly empty bladder. . Patient denies fever and stomach ache nausea, vomiting.  Last UTI was November 2016.  States this feels exactly like the UTI she had in November.  Using hydrocodone that she is taking for knee pain for current symptoms.    Patient does have a history of recurrent UTI.  Patient does not have a history of pyelonephritis.  No other aggravating or relieving factors.  No other c/o.   Past Medical History  Diagnosis Date  . Hypothyroid   . Depression   . Impaired fasting glucose 5/07  . Obesity   . Vitamin D deficiency   . Diverticulosis     ROS as in subjective  Reviewed allergies, medications, past medical, surgical, and social history.    Objective: Filed Vitals:   08/20/15 1608  BP: 152/80  Pulse: 68  Temp: 97.9 F (36.6 C)    General appearance: alert, no distress, WD/WN, female Abdomen: +bs, soft, non tender, non distended, no masses, no hepatomegaly, no splenomegaly, no bruits Back: no CVA tenderness GU: deferred     Laboratory:  Urine dipstick: sp gravity >1.030, Leuk 3+, blood neg, nit neg.       Assessment: Acute cystitis without hematuria - Plan: ciprofloxacin (CIPRO) 500 MG tablet, Urine culture  Left flank pain - Plan: POCT Urinalysis Dipstick   Plan: Discussed symptoms, diagnosis, possible complications, and usual course of illness.  Begin Cipro as prescribed.  Advised increased water intake, can use OTC Tylenol for pain.    Advised if she is not improving in next 2-3 days to let me know or if she develops fever, worsening pain, nausea or vomiting.     Urine culture sent.

## 2015-08-23 LAB — URINE CULTURE

## 2015-08-24 ENCOUNTER — Other Ambulatory Visit: Payer: Self-pay | Admitting: Family Medicine

## 2015-09-05 DIAGNOSIS — M1712 Unilateral primary osteoarthritis, left knee: Secondary | ICD-10-CM | POA: Diagnosis not present

## 2015-09-07 ENCOUNTER — Encounter (HOSPITAL_COMMUNITY): Payer: Self-pay | Admitting: Vascular Surgery

## 2015-09-07 ENCOUNTER — Inpatient Hospital Stay (HOSPITAL_COMMUNITY)
Admission: RE | Admit: 2015-09-07 | Discharge: 2015-09-07 | Disposition: A | Discharge status: Home / Self Care | Payer: Medicare Other | Source: Ambulatory Visit

## 2015-09-07 NOTE — Pre-Procedure Instructions (Signed)
Katie Kaiser  09/07/2015      [facility] [place], [place] - [address] [place] [place] [address] Phone: [phone] Fax: [phone] [place], [place] - [place] [address] [place] [address] Phone: [phone] Fax: [phone]  [place], [place] [address] Phone: [phone] Fax: [phone]    Your procedure is scheduled on Tues, May 16 @ 7:15 AM  Report to [facility] at 5:30 AM  Call this number if you have problems the morning of surgery:  [phone]   Remember:  Do not eat food or drink liquids after midnight.  Take these medicines the morning of surgery with A SIP OF WATER Wellbutrin(Bupropion),Prozac(Fluoxetine),Pain Pill(if needed),and Synthroid(Levothyroxine)              Stop taking your Aspirin and Fish Oil a week prior to surgery along with any Vitamins or Herbal Medications. No Goody's,BC's,Aleve,Advil,Motrin,or Ibuprofen.    Do not wear jewelry, make-up or nail polish.  Do not wear lotions, powders, or perfumes.    Do not shave 48 hours prior to surgery.    [facility] is not responsible for any belongings or valuables.  Contacts, dentures or bridgework may not be worn into surgery.  Leave your suitcase in the car.  After surgery it may be brought to your room.  For patients admitted to the hospital, discharge time will be determined by your treatment team.  Patients discharged the day of surgery will not be allowed to drive home.    Special instructions:  [facility] - Preparing for Surgery  Before surgery, you can play an important role.  Because skin is not sterile, your skin needs to be as free of germs as possible.  You can reduce the number of germs on you skin by washing with CHG (chlorahexidine gluconate) soap before surgery.  CHG is  an antiseptic cleaner which kills germs and bonds with the skin to continue killing germs even after washing.  Please DO NOT use if you have an allergy to CHG or antibacterial soaps.  If your skin becomes reddened/irritated stop using the CHG and inform your nurse when you arrive at Short Stay.  Do not shave (including legs and underarms) for at least 48 hours prior to the first CHG shower.  You may shave your face.  Please follow these instructions carefully:   1.  Shower with CHG Soap the night before surgery and the                                morning of Surgery.  2.  If you choose to wash your hair, wash your hair first as usual with your       normal shampoo.  3.  After you shampoo, rinse your hair and body thoroughly to remove the                      Shampoo.  4.  Use CHG as you would any other liquid soap.  You can apply chg directly       to the skin and wash gently with scrungie or a clean washcloth.  5.  Apply the CHG Soap to your body ONLY FROM THE NECK DOWN.        Do not use  on open wounds or open sores.  Avoid contact with your eyes,       ears, mouth and genitals (private parts).  Wash genitals (private parts)       with your normal soap.  6.  Wash thoroughly, paying special attention to the area where your surgery        will be performed.  7.  Thoroughly rinse your body with warm water from the neck down.  8.  DO NOT shower/wash with your normal soap after using and rinsing off       the CHG Soap.  9.  Pat yourself dry with a clean towel.            10.  Wear clean pajamas.            11.  Place clean sheets on your bed the night of your first shower and do not        sleep with pets.  Day of Surgery  Do not apply any lotions/deoderants the morning of surgery.  Please wear clean clothes to the hospital/surgery center.    Please read over the following fact sheets that you were given. Pain Booklet, Coughing and Deep Breathing, Blood Transfusion Information, MRSA  Information and Surgical Site Infection Prevention

## 2015-09-13 ENCOUNTER — Inpatient Hospital Stay (HOSPITAL_COMMUNITY): Admission: RE | Admit: 2015-09-13 | Payer: Medicare Other | Source: Ambulatory Visit

## 2015-09-18 ENCOUNTER — Encounter (HOSPITAL_COMMUNITY): Admission: RE | Payer: Self-pay | Source: Ambulatory Visit

## 2015-09-18 ENCOUNTER — Inpatient Hospital Stay (HOSPITAL_COMMUNITY): Admission: RE | Admit: 2015-09-18 | Payer: Medicare Other | Source: Ambulatory Visit | Admitting: Orthopaedic Surgery

## 2015-09-18 SURGERY — ARTHROPLASTY, KNEE, TOTAL
Anesthesia: General | Site: Knee | Laterality: Right

## 2015-09-24 ENCOUNTER — Telehealth: Payer: Self-pay

## 2015-09-24 DIAGNOSIS — F325 Major depressive disorder, single episode, in full remission: Secondary | ICD-10-CM

## 2015-09-24 MED ORDER — BUPROPION HCL ER (XL) 300 MG PO TB24
300.0000 mg | ORAL_TABLET | Freq: Every day | ORAL | Status: DC
Start: 2015-09-24 — End: 2015-10-18

## 2015-09-24 NOTE — Telephone Encounter (Signed)
She has appt 6/1.  See if she needs before visit, and if so, okay to rf #30 only

## 2015-09-24 NOTE — Telephone Encounter (Signed)
Needs refill of Buproprion 300 mg sent to [facility] on [address]. Thank you, RLB

## 2015-09-29 ENCOUNTER — Other Ambulatory Visit: Payer: Self-pay | Admitting: Family Medicine

## 2015-10-03 ENCOUNTER — Other Ambulatory Visit: Payer: Self-pay | Admitting: *Deleted

## 2015-10-03 MED ORDER — LEVOTHYROXINE SODIUM 200 MCG PO TABS: ORAL_TABLET | ORAL | Status: DC

## 2015-10-04 ENCOUNTER — Ambulatory Visit: Payer: Medicare Other | Admitting: Family Medicine

## 2015-10-18 ENCOUNTER — Encounter: Payer: Self-pay | Admitting: Family Medicine

## 2015-10-18 ENCOUNTER — Other Ambulatory Visit: Payer: Self-pay | Admitting: Family Medicine

## 2015-10-18 ENCOUNTER — Ambulatory Visit (INDEPENDENT_AMBULATORY_CARE_PROVIDER_SITE_OTHER): Payer: Medicare Other | Admitting: Family Medicine

## 2015-10-18 VITALS — BP 138/72 | HR 68 | Ht 65.0 in | Wt 235.0 lb

## 2015-10-18 DIAGNOSIS — E559 Vitamin D deficiency, unspecified: Secondary | ICD-10-CM

## 2015-10-18 DIAGNOSIS — Z1159 Encounter for screening for other viral diseases: Secondary | ICD-10-CM

## 2015-10-18 DIAGNOSIS — Z0000 Encounter for general adult medical examination without abnormal findings: Secondary | ICD-10-CM | POA: Diagnosis not present

## 2015-10-18 DIAGNOSIS — E039 Hypothyroidism, unspecified: Secondary | ICD-10-CM

## 2015-10-18 DIAGNOSIS — F325 Major depressive disorder, single episode, in full remission: Secondary | ICD-10-CM

## 2015-10-18 DIAGNOSIS — R5383 Other fatigue: Secondary | ICD-10-CM | POA: Diagnosis not present

## 2015-10-18 DIAGNOSIS — Z01419 Encounter for gynecological examination (general) (routine) without abnormal findings: Secondary | ICD-10-CM | POA: Diagnosis not present

## 2015-10-18 DIAGNOSIS — I10 Essential (primary) hypertension: Secondary | ICD-10-CM | POA: Diagnosis not present

## 2015-10-18 DIAGNOSIS — D649 Anemia, unspecified: Secondary | ICD-10-CM | POA: Diagnosis not present

## 2015-10-18 LAB — CBC WITH DIFFERENTIAL/PLATELET
BASOS ABS: 67 {cells}/uL (ref 0–200)
Basophils Relative: 1 %
EOS ABS: 134 {cells}/uL (ref 15–500)
EOS PCT: 2 %
HCT: 35.1 % (ref 35.0–45.0)
Hemoglobin: 11.3 g/dL — ABNORMAL LOW (ref 11.7–15.5)
LYMPHS ABS: 1541 {cells}/uL (ref 850–3900)
LYMPHS PCT: 23 %
MCH: 25 pg — AB (ref 27.0–33.0)
MCHC: 32.2 g/dL (ref 32.0–36.0)
MCV: 77.7 fL — AB (ref 80.0–100.0)
MONO ABS: 469 {cells}/uL (ref 200–950)
MONOS PCT: 7 %
MPV: 8.5 fL (ref 7.5–12.5)
Neutro Abs: 4489 cells/uL (ref 1500–7800)
Neutrophils Relative %: 67 %
PLATELETS: 240 10*3/uL (ref 140–400)
RBC: 4.52 MIL/uL (ref 3.80–5.10)
RDW: 15.8 % — ABNORMAL HIGH (ref 11.0–15.0)
WBC: 6.7 10*3/uL (ref 4.0–10.5)

## 2015-10-18 LAB — TSH: TSH: 2.28 mIU/L

## 2015-10-18 MED ORDER — BUPROPION HCL ER (XL) 300 MG PO TB24: 300.0000 mg | ORAL_TABLET | Freq: Every day | ORAL | Status: DC

## 2015-10-18 MED ORDER — FLUOXETINE HCL 40 MG PO CAPS: 40.0000 mg | ORAL_CAPSULE | Freq: Every day | ORAL | Status: DC

## 2015-10-18 MED ORDER — HYDROCHLOROTHIAZIDE 25 MG PO TABS: 25.0000 mg | ORAL_TABLET | Freq: Every day | ORAL | Status: DC

## 2015-10-18 NOTE — Progress Notes (Addendum)
Chief Complaint  Patient presents with  . Medicare Wellness    fasting med check plus with pelvic exam. Having right knee surgery 7/25 and she has not been able to exercise.     Katie Kaiser is a 70 y.o. female who presents for annual wellness visit and follow-up on chronic medical conditions.  She has the following concerns:  HTN: HCTZ dose was increased from 12.5-25mg in mid-December. Denies any side effects. Denies any muscle cramps or side effects. She is eating 1/2 banana daily. She doesn't check her BP elsewhere. Her home monitor wasn't accurate.  Hypothyroidism:  She has gained 10# since January since not able to exercise.  She complains of more fatigue than usual. Denies any cold intolerance (she gets hot easily), denies hair/skin/bowel changes. Last TSH was over 5, done over a year ago. Was to recheck sooner than a year, but this didn't happen. Lab Results  Component Value Date   TSH 5.361* 08/21/2014    Depression: Is well controlled on her current regimen. "I'm a happy girl". Just some intermittent problems sleeping.  Has problems with back pain when visiting her son in [place] on a different bed, playing with grandkids, and sometimes related to work. Flexeril is helpful. Currently not having pain.  She hasn't had as much problems when she stays at her sons recently, switched bed she sleeps in.  Immunization History  Administered Date(s) Administered  . Influenza Split 02/01/2012, 01/03/2013, 03/03/2014, 03/03/2014  . Influenza Whole 03/16/2011  . Influenza, High Dose Seasonal PF 03/14/2015  . Pneumococcal Conjugate-13 06/08/2013  . Pneumococcal Polysaccharide-23 03/03/2014  . Td 10/09/1997  . Tdap 08/02/2007, 03/16/2013  . Zoster 03/17/2011   Last Pap smear: 06/2013, no high risk HPV detected Last mammogram: 08/2010 (never went last year as recommended) Last colonoscopy: 02/2011 Last DEXA: had lifeline screening a few years ago. Never had full  DEXA. It was recommended 06/2013 and written rx was given to pt to get at [facility].  She still hasn't gotten this done Dentist: twice yearly Ophtho: every 2 years, due now Exercise: none, due to knee pain Lipids: Lab Results  Component Value Date   CHOL 167 06/08/2013   HDL 50 06/08/2013   LDLCALC 104* 06/08/2013   TRIG 66 06/08/2013   CHOLHDL 3.3 06/08/2013    Other doctors caring for patient include: Ophtho:Dr. Nicki Reaper at [facility] (last seen 2 years ago) Dentist: Mining engineer Dermatologist: Dr. Allyson Sabal GI: Dr. Carlean Purl Ortho: [facility] (sees Dr. Rudene Anda PA, Biagio Borg)   Depression screen: negative Fall screen: negative Function Status screen: notable only for problems with stairs/walking related to knee pain  End of Life Discussion:  Patient does not have a living will and medical power of attorney.  She is seeing her son this weekend and will discuss.  Given new forms again this year  Past Medical History  Diagnosis Date  . Hypothyroid   . Depression   . Impaired fasting glucose 5/07  . Obesity   . Vitamin D deficiency   . Diverticulosis     Past Surgical History  Procedure Laterality Date  . Thyroidectomy  1975  . Tonsillectomy    . Appendectomy    . Cesarean section    . Orif forearm fracture  2002  . Colonoscopy  02/04/2011    diverticulosis    Social History   Social History  . Marital Status: Single    Spouse Name: N/A  . Number of Children: 1  . Years  of Education: N/A   Occupational History  . hairdresser    Social History Main Topics  . Smoking status: Former Smoker    Quit date: 05/05/1978  . Smokeless tobacco: Never Used  . Alcohol Use: 0.0 oz/week    0 Standard drinks or equivalent per week     Comment: a glass of wine or beer once or twice a month or less  . Drug Use: No  . Sexual Activity: Not Currently   Other Topics Concern  . Not on file   Social History Narrative   Lives alone, 4 dogs. Son lives in  [place], [place] granddaughters    Family History  Problem Relation Age of Onset  . Parkinsonism Mother   . Diabetes Brother   . Heart disease Brother     dx'd in 18's  . Cancer Neg Hx   . Colon cancer Neg Hx   . Stomach cancer Neg Hx   . HIV Brother   . Arthritis Sister   . Arthritis Sister     Outpatient Encounter Prescriptions as of 10/18/2015  Medication Sig  . aspirin 81 MG tablet Take 81 mg by mouth daily.    Marland Kitchen buPROPion (WELLBUTRIN XL) 300 MG 24 hr tablet Take 1 tablet (300 mg total) by mouth daily.  . Cholecalciferol (VITAMIN D) 1000 UNITS capsule Take 1,000 Units by mouth daily.    Marland Kitchen esomeprazole (NEXIUM) 20 MG capsule Take 20 mg by mouth daily at 12 noon.  . fish oil-omega-3 fatty acids 1000 MG capsule Take 2 g by mouth daily.    Marland Kitchen FLUoxetine (PROZAC) 40 MG capsule Take 1 capsule (40 mg total) by mouth daily.  . hydrochlorothiazide (HYDRODIURIL) 25 MG tablet TAKE 1 TABLET BY MOUTH DAILY  . HYDROcodone-acetaminophen (NORCO/VICODIN) 5-325 MG tablet Take 1 tablet by mouth 2 (two) times daily as needed for moderate pain.  Marland Kitchen levothyroxine (SYNTHROID) 200 MCG tablet TAKE 1 TABLET (200 MCG TOTAL) BY MOUTH DAILY AS DIRECTED   No facility-administered encounter medications on file as of 10/18/2015.    No Known Allergies   ROS: The patient denies anorexia, fever, headaches, vision changes, decreased hearing, ear pain, sore throat, breast concerns, chest pain, palpitations, dizziness, syncope, dyspnea on exertion, cough, swelling, nausea, vomiting, diarrhea, constipation, abdominal pain, melena, hematochezia, indigestion/heartburn, hematuria, incontinence, dysuria, vaginal bleeding, discharge, odor or itch, genital lesions, numbness, tingling, weakness, tremor, suspicious skin lesions, abnormal bleeding/bruising, or enlarged lymph nodes. +10# weight gain and worsening fatigue, as per HPI. +bilateral knee pain (R>L), surgery scheduled Depression is well controlled   PHYSICAL  EXAM:  BP 138/72 mmHg  Pulse 68  Ht '5\' 5"'  (1.651 m)  Wt 235 lb (106.595 kg)  BMI 39.11 kg/m2  LMP 05/05/1990  General Appearance:   Alert, cooperative, no distress, appears stated age  Head:   Normocephalic, without obvious abnormality, atraumatic  Eyes:   PERRL, conjunctiva/corneas clear, EOM's intact, fundi   benign  Ears:   Normal TM's and external ear canals.  Nose:  Nares normal, mucosa normal, no drainage or sinus tenderness  Throat:  Lips, mucosa, and tongue normal; teeth and gums normal  Neck:  Supple, no lymphadenopathy; thyroid: no enlargement/tenderness/nodules; no carotid  bruit or JVD  Back:  Spine nontender, no curvature, ROM normal, no CVA tenderness  Lungs:   Clear to auscultation bilaterally without wheezes, rales or ronchi; respirations unlabored  Chest Wall:   No tenderness or deformity  Heart:   Regular rate and rhythm, S1 and S2 normal, no rub  or gallop. 2/6 SEM heard loudest at RUSB, unchanged  Breast Exam:   No tenderness, masses, or nipple discharge or inversion. No axillary lymphadenopathy  Abdomen:   Soft, non-tender, nondistended, normoactive bowel sounds,   no masses, no hepatosplenomegaly  Genitalia:   Normal external genitalia without lesions. BUS and vagina normal; no cervical motion tenderness. No abnormal vaginal discharge. Uterus and adnexa not enlarged, nontender, no masses. Pap not performed  Rectal:   Normal tone, no masses or tenderness; guaiac negative stool  Extremities:  No clubbing, cyanosis or edema.  Pulses:  2+ and symmetric all extremities  Skin:  Skin color, texture, turgor normal, no rashes or lesions  Lymph nodes:  Cervical, supraclavicular, and axillary nodes normal  Neurologic:  CNII-XII intact, normal strength, sensation and gait; reflexes 2+ and symmetric throughout   Psych:  Normal mood, affect, hygiene and grooming              ASSESSMENT/PLAN:  Medicare annual wellness visit, subsequent  Hypothyroidism, unspecified hypothyroidism type - suspect inadequately replaced by history. Adjust Synthroid based on TSh  Vitamin D deficiency - Plan: VITAMIN D 25 Hydroxy (Vit-D Deficiency, Fractures)  Depression, major, in remission ([place]) - Plan: buPROPion (WELLBUTRIN XL) 300 MG 24 hr tablet, FLUoxetine (PROZAC) 40 MG capsule  Essential hypertension - controlled - Plan: Lipid panel, Comprehensive metabolic panel, hydrochlorothiazide (HYDRODIURIL) 25 MG tablet  Other fatigue - Plan: Comprehensive metabolic panel, CBC with Differential/Platelet, TSH, VITAMIN D 25 Hydroxy (Vit-D Deficiency, Fractures)  Need for hepatitis C screening test - Plan: Hepatitis C antibody   Discussed monthly self breast exams and yearly mammograms (she is past due and reminded to schedule); at least 30 minutes of aerobic activity at least 5 days/week, weight-bearing exercise at least 2x/week; proper sunscreen use reviewed; healthy diet, including goals of calcium and vitamin D intake and alcohol recommendations (less than or equal to 1 drink/day) reviewed; regular seatbelt use; changing batteries in smoke detectors. Immunization recommendations discussed--yearly flu shots recommended, UTD. Colonoscopy recommendations reviewed, UTD. Hemasure kit given.  DEXA was recommended last year, never done--encouraged to schedule along with her mammogram.   CBC, TSH, c-met, A1c  MOST form reviewed/updated. Full Code, Full Care  Weight loss encouraged--after knee surgery will be able to be more active. Healthy diet encouraged.  Please call [facility] to schedule your routine mammogram, and also bone density test (they will need to fax me an order to sign). Please schedule routine eye exam .  Medicare Attestation I have personally reviewed: The patient's medical and social history Their use of  alcohol, tobacco or illicit drugs Their current medications and supplements The patient's functional ability including ADLs,fall risks, home safety risks, cognitive, and hearing and visual impairment Diet and physical activities Evidence for depression or mood disorders  The patient's weight, height, and BMI have been recorded in the chart.  I have made referrals, counseling, and provided education to the patient based on review of the above and I have provided the patient with a written personalized care plan for preventive services.     Rocket Gunderson A, MD   10/18/2015

## 2015-10-18 NOTE — Patient Instructions (Addendum)
  HEALTH MAINTENANCE RECOMMENDATIONS:  It is recommended that you get at least 30 minutes of aerobic exercise at least 5 days/week (for weight loss, you may need as much as 60-90 minutes). This can be any activity that gets your heart rate up. This can be divided in 10-15 minute intervals if needed, but try and build up your endurance at least once a week.  Weight bearing exercise is also recommended twice weekly.  Eat a healthy diet with lots of vegetables, fruits and fiber.  "Colorful" foods have a lot of vitamins (ie green vegetables, tomatoes, red peppers, etc).  Limit sweet tea, regular sodas and alcoholic beverages, all of which has a lot of calories and sugar.  Up to 1 alcoholic drink daily may be beneficial for women (unless trying to lose weight, watch sugars).  Drink a lot of water.  Calcium recommendations are 1200-1500 mg daily (1500 mg for postmenopausal women or women without ovaries), and vitamin D 1000 IU daily.  This should be obtained from diet and/or supplements (vitamins), and calcium should not be taken all at once, but in divided doses.  Monthly self breast exams and yearly mammograms for women over the age of 22 is recommended.  Sunscreen of at least SPF 30 should be used on all sun-exposed parts of the skin when outside between the hours of 10 am and 4 pm (not just when at beach or pool, but even with exercise, golf, tennis, and yard work!)  Use a sunscreen that says "broad spectrum" so it covers both UVA and UVB rays, and make sure to reapply every 1-2 hours.  Remember to change the batteries in your smoke detectors when changing your clock times in the spring and fall.  Use your seat belt every time you are in a car, and please drive safely and not be distracted with cell phones and texting while driving.    Katie Kaiser , Thank you for taking time to come for your Medicare Wellness Visit. I appreciate your ongoing commitment to your health goals. Please review the  following plan we discussed and let me know if I can assist you in the future.   These are the goals we discussed: Goals    None      This is a list of the screening recommended for you and due dates:  Health Maintenance  Topic Date Due  .  Hepatitis C: One time screening is recommended by Center for Disease Control  (CDC) for  adults born from [birth year] through 1963.   Jan 04, 1946  . Mammogram  08/25/2012  . DEXA scan (bone density measurement)  12/16/2012  . Flu Shot  12/04/2015  . Colon Cancer Screening  02/03/2021  . Tetanus Vaccine  03/17/2023  . Shingles Vaccine  Completed  . Pneumonia vaccines  Completed    We are checking for hepatitis C screen with your labs today.  Please call [facility] to schedule your routine mammogram, and also bone density test (they will need to fax me an order to sign). Please schedule routine eye exam .

## 2015-10-19 LAB — COMPREHENSIVE METABOLIC PANEL
ALT: 14 U/L (ref 6–29)
AST: 16 U/L (ref 10–35)
Albumin: 3.8 g/dL (ref 3.6–5.1)
Alkaline Phosphatase: 99 U/L (ref 33–130)
BUN: 21 mg/dL (ref 7–25)
CHLORIDE: 105 mmol/L (ref 98–110)
CO2: 23 mmol/L (ref 20–31)
CREATININE: 0.66 mg/dL (ref 0.50–0.99)
Calcium: 8.6 mg/dL (ref 8.6–10.4)
GLUCOSE: 95 mg/dL (ref 65–99)
POTASSIUM: 3.9 mmol/L (ref 3.5–5.3)
SODIUM: 140 mmol/L (ref 135–146)
TOTAL PROTEIN: 6.6 g/dL (ref 6.1–8.1)
Total Bilirubin: 0.4 mg/dL (ref 0.2–1.2)

## 2015-10-19 LAB — IRON: Iron: 46 ug/dL (ref 45–160)

## 2015-10-19 LAB — LIPID PANEL
CHOL/HDL RATIO: 3.1 ratio (ref <=5.0)
Cholesterol: 173 mg/dL (ref 125–200)
HDL: 56 mg/dL (ref >=46)
LDL CALC: 105 mg/dL (ref <130)
TRIGLYCERIDES: 58 mg/dL (ref <150)
VLDL: 12 mg/dL (ref <30)

## 2015-10-19 LAB — CP HCV MEDICARE: HCV AB: NEGATIVE

## 2015-10-19 LAB — VITAMIN D 25 HYDROXY (VIT D DEFICIENCY, FRACTURES): Vit D, 25-Hydroxy: 50 ng/mL (ref 30–100)

## 2015-10-19 LAB — FERRITIN: Ferritin: 30 ng/mL (ref 20–288)

## 2015-10-19 LAB — HEPATITIS C ANTIBODY: HCV Ab: NEGATIVE

## 2015-10-19 MED ORDER — SYNTHROID 200 MCG PO TABS: ORAL_TABLET | ORAL | Status: DC

## 2015-10-19 NOTE — Addendum Note (Signed)
Addended byRita Ohara on: 10/19/2015 07:19 AM   Modules accepted: Orders

## 2015-10-31 ENCOUNTER — Encounter: Payer: Medicare Other | Admitting: Family Medicine

## 2015-11-06 ENCOUNTER — Encounter: Payer: Self-pay | Admitting: Family Medicine

## 2015-11-15 ENCOUNTER — Encounter: Payer: Self-pay | Admitting: Family Medicine

## 2015-11-15 ENCOUNTER — Ambulatory Visit (INDEPENDENT_AMBULATORY_CARE_PROVIDER_SITE_OTHER): Payer: Medicare Other | Admitting: Family Medicine

## 2015-11-15 VITALS — BP 138/80 | HR 88 | Ht 65.0 in | Wt 234.0 lb

## 2015-11-15 DIAGNOSIS — N3281 Overactive bladder: Secondary | ICD-10-CM

## 2015-11-15 MED ORDER — OXYBUTYNIN CHLORIDE ER 10 MG PO TB24: 10.0000 mg | ORAL_TABLET | Freq: Every day | ORAL | Status: DC

## 2015-11-15 NOTE — Patient Instructions (Addendum)
Overactive Bladder, Adult Overactive bladder is a group of urinary symptoms. With overactive bladder, you may suddenly feel the need to pass urine (urinate) right away. After feeling this sudden urge, you might also leak urine if you cannot get to the bathroom fast enough (urinary incontinence). These symptoms might interfere with your daily work or social activities. Overactive bladder symptoms may also wake you up at night. Overactive bladder affects the nerve signals between your bladder and your brain. Your bladder may get the signal to empty before it is full. Very sensitive muscles can also make your bladder squeeze too soon. CAUSES Many things can cause an overactive bladder. Possible causes include:  Urinary tract infection.  Infection of nearby tissues, such as the prostate.  Prostate enlargement.  Being pregnant with twins or more (multiples).  Surgery on the uterus or urethra.  Bladder stones, inflammation, or tumors.  Drinking too much caffeine or alcohol.  Certain medicines, especially those that you take to help your body get rid of extra fluid (diuretics) by increasing urine production.  Muscle or nerve weakness, especially from:  A spinal cord injury.  Stroke.  Multiple sclerosis.  Parkinson disease.  Diabetes. This can cause a high urine volume that fills the bladder so quickly that the normal urge to urinate is triggered very strongly.  Constipation. A buildup of too much stool can put pressure on your bladder. RISK FACTORS You may be at greater risk for overactive bladder if you:  Are an older adult.  Smoke.  Are going through menopause.  Have prostate problems.  Have a neurological disease, such as stroke, dementia, Parkinson disease, or multiple sclerosis (MS).  Eat or drink things that irritate the bladder. These include alcohol, spicy food, and caffeine.  Are overweight or obese. SIGNS AND SYMPTOMS  The signs and symptoms of an overactive  bladder include:  Sudden, strong urges to urinate.  Leaking urine.  Urinating eight or more times per day.  Waking up to urinate two or more times per night. DIAGNOSIS Your health care provider may suspect overactive bladder based on your symptoms. The health care provider will do a physical exam and take your medical history. Blood or urine tests may also be done. For example, you might need to have a bladder function test to check how well you can hold your urine. You might also need to see a health care provider who specializes in the urinary tract (urologist). TREATMENT Treatment for overactive bladder depends on the cause of your condition and whether it is mild or severe. Certain treatments can be done in your health care provider's office or clinic. You can also make lifestyle changes at home. Options include: Behavioral Treatments  Biofeedback. A specialist uses sensors to help you become aware of your body's signals.  Keeping a daily log of when you need to urinate and what happens after the urge. This may help you manage your condition.  Bladder training. This helps you learn to control the urge to urinate by following a schedule that directs you to urinate at regular intervals (timed voiding). At first, you might have to wait a few minutes after feeling the urge. In time, you should be able to schedule bathroom visits an hour or more apart.  Kegel exercises. These are exercises to strengthen the pelvic floor muscles, which support the bladder. Toning these muscles can help you control urination, even if your bladder muscles are overactive. A specialist will teach you how to do these exercises correctly. They   require daily practice.  Weight loss. If you are obese or overweight, losing weight might relieve your symptoms of overactive bladder. Talk to your health care provider about losing weight and whether there is a specific program or method that would work best for you.  Diet  change. This might help if constipation is making your overactive bladder worse. Your health care provider or a dietitian can explain ways to change what you eat to ease constipation. You might also need to consume less alcohol and caffeine or drink other fluids at different times of the day.  Stopping smoking.  Wearing pads to absorb leakage while you wait for other treatments to take effect. Physical Treatments  Electrical stimulation. Electrodes send gentle pulses of electricity to strengthen the nerves or muscles that help to control the bladder. Sometimes, the electrodes are placed outside of the body. In other cases, they might be placed inside the body (implanted). This treatment can take several months to have an effect.  Supportive devices. Women may need a plastic device that fits into the vagina and supports the bladder (pessary). Medicines Several medicines can help treat overactive bladder and are usually used along with other treatments. Some are injected into the muscles involved in urination. Others come in pill form. Your health care provider may prescribe:  Antispasmodics. These medicines block the signals that the nerves send to the bladder. This keeps the bladder from releasing urine at the wrong time.  Tricyclic antidepressants. These types of antidepressants also relax bladder muscles. Surgery  You may have a device implanted to help manage the nerve signals that indicate when you need to urinate.  You may have surgery to implant electrodes for electrical stimulation.  Sometimes, very severe cases of overactive bladder require surgery to change the shape of the bladder. HOME CARE INSTRUCTIONS   Take medicines only as directed by your health care provider.  Use any implants or a pessary as directed by your health care provider.  Make any diet or lifestyle changes that are recommended by your health care provider. These might include:  Drinking less fluid or  drinking at different times of the day. If you need to urinate often during the night, you may need to stop drinking fluids early in the evening.  Cutting down on caffeine or alcohol. Both can make an overactive bladder worse. Caffeine is found in coffee, tea, and sodas.  Doing Kegel exercises to strengthen muscles.  Losing weight if you need to.  Eating a healthy and balanced diet to prevent constipation.  Keep a journal or log to track how much and when you drink and also when you feel the need to urinate. This will help your health care provider to monitor your condition. SEEK MEDICAL CARE IF:  Your symptoms do not get better after treatment.  Your pain and discomfort are getting worse.  You have more frequent urges to urinate.  You have a fever. SEEK IMMEDIATE MEDICAL CARE IF: You are not able to control your bladder at all.   This information is not intended to replace advice given to you by your health care provider. Make sure you discuss any questions you have with your health care provider.   Medications used to treat this condition include Oxybutynin is likely the least expensive (the regular is short-acting, dosed 2-3 times/day, vs the extended release that is once daily) Detrol (tolterodine), Vesicare (solifenacin), Toviaz (tesoterodine), Enablex (darifenacin)    Document Released: 02/15/2009 Document Revised: 05/12/2014 Document Reviewed: 09/14/2013 Elsevier  Interactive Patient Education [employer].

## 2015-11-15 NOTE — Progress Notes (Signed)
Chief Complaint  Patient presents with  . Advice Only    here to get started on medication for bladder control.    She is planning on R TKR 7/25 (Dr. Durward Fortes).  Currently she is up to void at night at least 3-4 times. She wants to get this under control prior to surgery. She tries to limit fluids at night.  She has never taken any medication for urinary symptoms in the past. She reports urinary urgency, but not much leakage. Some dribbling at the end of a void. Denies dysuria, hematuria, urinary odor. She has some leakage with coughing (when sick). Only has 2 cups of coffee in the morning, none later in the day. She drinks Crystal Light tea (non-caffeinated, sugar-free).  No alcohol.  Her current insurance does not have pharmacy benefit--will be paying out of pocket for medication  PMH, PSH, SH reviewed  Current Outpatient Prescriptions on File Prior to Visit  Medication Sig Dispense Refill  . aspirin 81 MG tablet Take 81 mg by mouth daily.      Marland Kitchen buPROPion (WELLBUTRIN XL) 300 MG 24 hr tablet Take 1 tablet (300 mg total) by mouth daily. 30 tablet 11  . Cholecalciferol (VITAMIN D) 1000 UNITS capsule Take 1,000 Units by mouth daily.      Marland Kitchen esomeprazole (NEXIUM) 20 MG capsule Take 20 mg by mouth daily at 12 noon.    . fish oil-omega-3 fatty acids 1000 MG capsule Take 2 g by mouth daily.      Marland Kitchen FLUoxetine (PROZAC) 40 MG capsule Take 1 capsule (40 mg total) by mouth daily. 90 capsule 3  . hydrochlorothiazide (HYDRODIURIL) 25 MG tablet Take 1 tablet (25 mg total) by mouth daily. 90 tablet 3  . HYDROcodone-acetaminophen (NORCO/VICODIN) 5-325 MG tablet Take 1 tablet by mouth 2 (two) times daily as needed for moderate pain.    Marland Kitchen SYNTHROID 200 MCG tablet TAKE 1 TABLET (200 MCG TOTAL) BY MOUTH DAILY AS DIRECTED (Patient taking differently: Take 200 mcg by mouth daily before breakfast. ) 30 tablet 11   No current facility-administered medications on file prior to visit.   No Known  Allergies  ROS: no fever, chills, urinary complaints other than frequency and urgency as noted in HPI.  No vaginal discharge, flank pain, abdominal pain, nausea, vomiting.  Currently denies any URI symptoms ,cough, shortness of breath, chest pain or other concerns.  PHYSICAL EXAM: BP 138/80 mmHg  Pulse 88  Ht 5\' 5"  (1.651 m)  Wt 234 lb (106.142 kg)  BMI 38.94 kg/m2  LMP 05/05/1990  Well developed, pleasant female in no distress Some antalgic gait due to knee pain Neuro: alert and oriented, cranial nerves intact, normal strength Psych: normal mood, affect, hygiene and grooming  ASSESSMENT/PLAN:  Overactive bladder - Plan: oxybutynin (DITROPAN XL) 10 MG 24 hr tablet  Discussed behavioral measures (limiting fluids after dinner, limiting caffeine, voiding frequently during the day).  Discussed the various medications, doses and side effects.  While in the office, she called [facility] pharmacy to check on approximate cost.  She got estimate for: oxybutynin XR 10mg   #30 for $42. I discussed that I usually start at 5mg , and increase to 10mg  after a week if ineffective, and then further increase to 15mg , and then change the rx to whichever dose is most effective with least side effects.  Risks and side effects of meds reviewed in detail.  She prefers to start with the 10mg , will call to change to lower dose if having problems. Discussed  that patients can respond differently to different medications, and sometimes need to switch to others, that may be more effective. Given list of names, so if needed, she can check on costs. She plans to change to another insurance policy in October.    Medications used to treat this condition include Oxybutynin is likely the least expensive (the regular is short-acting, dosed 2-3 times/day, vs the extended release that is once daily) Detrol (tolterodine), Vesicare (solifenacin), Toviaz (tesoterodine), Enablex (darifenacin)

## 2015-11-16 ENCOUNTER — Encounter (HOSPITAL_COMMUNITY): Payer: Self-pay

## 2015-11-16 ENCOUNTER — Other Ambulatory Visit: Payer: Self-pay

## 2015-11-16 ENCOUNTER — Encounter (HOSPITAL_COMMUNITY)
Admission: RE | Admit: 2015-11-16 | Discharge: 2015-11-16 | Disposition: A | Discharge status: Home / Self Care | Payer: Medicare Other | Source: Ambulatory Visit | Attending: Orthopaedic Surgery | Admitting: Orthopaedic Surgery

## 2015-11-16 ENCOUNTER — Ambulatory Visit (HOSPITAL_COMMUNITY)
Admission: RE | Admit: 2015-11-16 | Discharge: 2015-11-16 | Disposition: A | Discharge status: Home / Self Care | Payer: Medicare Other | Source: Ambulatory Visit | Attending: Orthopedic Surgery | Admitting: Orthopedic Surgery

## 2015-11-16 DIAGNOSIS — I517 Cardiomegaly: Secondary | ICD-10-CM | POA: Diagnosis not present

## 2015-11-16 DIAGNOSIS — Z01812 Encounter for preprocedural laboratory examination: Secondary | ICD-10-CM | POA: Insufficient documentation

## 2015-11-16 DIAGNOSIS — Z01810 Encounter for preprocedural cardiovascular examination: Secondary | ICD-10-CM | POA: Insufficient documentation

## 2015-11-16 DIAGNOSIS — Z01818 Encounter for other preprocedural examination: Secondary | ICD-10-CM | POA: Insufficient documentation

## 2015-11-16 DIAGNOSIS — R918 Other nonspecific abnormal finding of lung field: Secondary | ICD-10-CM | POA: Insufficient documentation

## 2015-11-16 HISTORY — DX: Unspecified osteoarthritis, unspecified site: M19.90

## 2015-11-16 HISTORY — DX: Calculus of kidney: N20.0

## 2015-11-16 HISTORY — DX: Cardiac murmur, unspecified: R01.1

## 2015-11-16 HISTORY — DX: Pneumonia, unspecified organism: J18.9

## 2015-11-16 HISTORY — DX: Gastro-esophageal reflux disease without esophagitis: K21.9

## 2015-11-16 LAB — COMPREHENSIVE METABOLIC PANEL
ALBUMIN: 3.6 g/dL (ref 3.5–5.0)
ALT: 16 U/L (ref 14–54)
AST: 20 U/L (ref 15–41)
Alkaline Phosphatase: 110 U/L (ref 38–126)
Anion gap: 9 (ref 5–15)
BUN: 20 mg/dL (ref 6–20)
CHLORIDE: 104 mmol/L (ref 101–111)
CO2: 23 mmol/L (ref 22–32)
CREATININE: 0.74 mg/dL (ref 0.44–1.00)
Calcium: 9.1 mg/dL (ref 8.9–10.3)
GFR calc Af Amer: >60 mL/min (ref >60)
GLUCOSE: 106 mg/dL — AB (ref 65–99)
Potassium: 3.3 mmol/L — ABNORMAL LOW (ref 3.5–5.1)
SODIUM: 136 mmol/L (ref 135–145)
Total Bilirubin: 0.4 mg/dL (ref 0.3–1.2)
Total Protein: 6.8 g/dL (ref 6.5–8.1)

## 2015-11-16 LAB — PROTIME-INR
INR: 1.04 (ref 0.00–1.49)
Prothrombin Time: 13.8 seconds (ref 11.6–15.2)

## 2015-11-16 LAB — CBC WITH DIFFERENTIAL/PLATELET
BASOS ABS: 0 10*3/uL (ref 0.0–0.1)
Basophils Relative: 0 %
EOS PCT: 3 %
Eosinophils Absolute: 0.2 10*3/uL (ref 0.0–0.7)
HCT: 34.3 % — ABNORMAL LOW (ref 36.0–46.0)
Hemoglobin: 11.2 g/dL — ABNORMAL LOW (ref 12.0–15.0)
LYMPHS PCT: 31 %
Lymphs Abs: 2.1 10*3/uL (ref 0.7–4.0)
MCH: 25.8 pg — ABNORMAL LOW (ref 26.0–34.0)
MCHC: 32.7 g/dL (ref 30.0–36.0)
MCV: 79 fL (ref 78.0–100.0)
Monocytes Absolute: 0.3 10*3/uL (ref 0.1–1.0)
Monocytes Relative: 5 %
NEUTROS ABS: 4.2 10*3/uL (ref 1.7–7.7)
Neutrophils Relative %: 61 %
PLATELETS: 211 10*3/uL (ref 150–400)
RBC: 4.34 MIL/uL (ref 3.87–5.11)
RDW: 15 % (ref 11.5–15.5)
WBC: 6.9 10*3/uL (ref 4.0–10.5)

## 2015-11-16 LAB — URINALYSIS, ROUTINE W REFLEX MICROSCOPIC
BILIRUBIN URINE: NEGATIVE
Glucose, UA: NEGATIVE mg/dL
Hgb urine dipstick: NEGATIVE
KETONES UR: NEGATIVE mg/dL
LEUKOCYTES UA: NEGATIVE
NITRITE: NEGATIVE
PROTEIN: NEGATIVE mg/dL
Specific Gravity, Urine: 1.025 (ref 1.005–1.030)
pH: 5.5 (ref 5.0–8.0)

## 2015-11-16 LAB — ABO/RH: ABO/RH(D): O NEG

## 2015-11-16 LAB — SURGICAL PCR SCREEN
MRSA, PCR: NEGATIVE
STAPHYLOCOCCUS AUREUS: NEGATIVE

## 2015-11-16 LAB — TYPE AND SCREEN
ABO/RH(D): O NEG
Antibody Screen: NEGATIVE

## 2015-11-16 LAB — APTT: APTT: 29 s (ref 24–37)

## 2015-11-16 NOTE — Pre-Procedure Instructions (Signed)
Katie Kaiser  11/16/2015      [place], [place] [id number] [place] [place] [address] Phone: [phone] Fax: [place], [place] - [address] [place] [address] Phone: [phone] Fax: [phone]  [name] [place], [place] [address] Phone: [phone] Fax: [phone]    Your procedure is scheduled on July 25  Report to [facility] at 530 A.M.  Call this number if you have problems the morning of surgery:  [phone]   Remember:  Do not eat food or drink liquids after midnight.  Take these medicines the morning of surgery with A SIP OF WATER bupropion (Wellbutrin), esomeprazole (Nexium), fluoxetine (Prozac), Hydrocodone-acetaminophen (Norco), synthroid   Stop taking aspirin, BC's, Goody's, Herbal medications, Fish Oil, Ibuprofen, Advil, Motrin, Aleve, Vitamins   Do not wear jewelry, make-up or nail polish.  Do not wear lotions, powders, or perfumes.  You may wear deoderant.  Do not shave 48 hours prior to surgery.  Men may shave face and neck.  Do not bring valuables to the hospital.  [facility] is not responsible for any belongings or valuables.  Contacts, dentures or bridgework may not be worn into surgery.  Leave your suitcase in the car.  After surgery it may be brought to your room.  For patients admitted to the hospital, discharge time will be determined by your treatment team.  Patients discharged the day of surgery will not be allowed to drive home.   Special instructions:  [facility] - Preparing for Surgery  Before surgery, you can play an important role.  Because skin is not sterile, your skin needs to be as free of germs as possible.  You can reduce the number of germs on you skin by washing with CHG  (chlorahexidine gluconate) soap before surgery.  CHG is an antiseptic cleaner which kills germs and bonds with the skin to continue killing germs even after washing.  Please DO NOT use if you have an allergy to CHG or antibacterial soaps.  If your skin becomes reddened/irritated stop using the CHG and inform your nurse when you arrive at Short Stay.  Do not shave (including legs and underarms) for at least 48 hours prior to the first CHG shower.  You may shave your face.  Please follow these instructions carefully:   1.  Shower with CHG Soap the night before surgery and the  morning of Surgery.  2.  If you choose to wash your hair, wash your hair first as usual with your normal shampoo.  3.  After you shampoo, rinse your hair and body thoroughly to remove the  Shampoo.  4.  Use CHG as you would any other liquid soap.  You can apply chg directly to the skin and wash gently with scrungie or a clean washcloth.  5.  Apply the CHG Soap to your body ONLY FROM THE NECK DOWN.   Do not use on open wounds or open sores.  Avoid contact with your eyes, ears, mouth and genitals (private parts).  Wash genitals (private parts) with your normal soap.  6.  Wash thoroughly, paying special attention to the area where your surgery will be performed.  7.  Thoroughly rinse your body with warm water from the neck down.  8.  DO NOT shower/wash with your normal soap after using  and rinsing off  the CHG Soap.  9.  Pat yourself dry with a clean towel.            10.  Wear clean pajamas.            11.  Place clean sheets on your bed the night of your first shower and do not sleep with pets.  Day of Surgery  Do not apply any lotions/deoderants the morning of surgery.  Please wear clean clothes to the hospital/surgery center.     Please read over the following fact sheets that you were given. Pain Booklet, Coughing and Deep Breathing, MRSA Information and Surgical Site Infection Prevention, Incentive  Spirometry

## 2015-11-16 NOTE — Progress Notes (Addendum)
PCP is Dr. Rita Ohara Denies ever seeing a cardiologist. Denies ever having a card cath, stress test, or echo Pt states she has been told by Dr Tomi Bamberger she has a slight heart murmur.

## 2015-11-17 LAB — URINE CULTURE

## 2015-11-22 NOTE — H&P (Signed)
CHIEF COMPLAINT:  Painful right knee.   HISTORY OF PRESENT ILLNESS:  Katie Kaiser is a very pleasant 70 year old white female who has had chronic problems with her right knee.  We first saw her back in 2014 when she was having problems with the right knee for about 2 to 3 years.  She had been tried with anti-inflammatories and actually had cortisone injections to her right knee by Dr. Redmond School at that time.  They have had some benefit at times.  We have seen her for this and have given her not only corticosteroid injections but also Euflexxa injections multiply.  She had just finished her last Euflexxa injection in April of this year.  Since that time she has had increasing pain and discomfort in the knee requiring some intermittent use of narcotics for her pain.  She now has pain with every step, also nighttime pain.  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  She does have a limp.  She has tried corticosteroid injections, Euflexxa, and conservative exercises and treatment and basically has now failed to the point where her pain is much worse, and she is unable to control.  She is seen today for evaluation.   PAST MEDICAL HISTORY:  In general, her health is good.   HOSPITALIZATIONS:  In 1980 for Cesarean section, 2005 for open reduction internal fixation of the left forearm, tonsillectomy as a child, appendectomy as a child.   MEDICATIONS:  Synthroid 0.2 mg daily, Wellbutrin 300 mg daily, aspirin 81 mg daily, vitamin D 1000 mg daily, omega-3 two daily, Prozac 20 mg daily.   ALLERGIES:  None known.   FOURTEEN-POINT REVIEW OF SYSTEMS:  Reveals a history of pneumonia in January of this year.  Also does have a history of a heart murmur and no treatment for it.  She does have hypothyroidism.  This is secondary to removal of the majority of the thyroid for a cyst.  She does have occasional ankle swelling.  Her last renal  calculi was in 2013.  She had bladder infections on a regular basis.  She does also have a history of depression and is on Prozac.   FAMILY HISTORY:  Positive for heart attack in a brother as well as hypertension and diabetes.  Sisters did have a history of heart attack and arthritis.   SOCIAL HISTORY:  Katie Kaiser is a 70 year old white single female, a hair stylist.  She quit smoking in 1979.  Prior to that she smoked 1 pack of cigarettes per day for 10 years.   PHYSICAL EXAMINATION:  General:  Today reveals very pleasant.  Height 5 feet 5 inches.  Weight 212 pounds.  BMI 35.3. Vital Signs:  Temperature of 97.9.  Pulse 76.  Respirations 20.  Blood pressure 167/76. Head:  Normocephalic. Eyes:  Pupils equal, round, and reactive to light and accommodation with extraocular movements intact. Ears/Nose/Throat:  Benign. Neck:  Supple.  No carotid bruits were noted. Chest:  Had good expansion. Lungs:  Essentially clear. Cardiac:  Had a regular rhythm and rate with a grade 1/6 to 2/6 systolic murmur. Pulses:  Were 2+, bilateral and symmetric, in the lower extremities. Abdomen:  Obese, soft, nontender.  No mass palpable.  Normal bowel sounds present. CNS:  Oriented x3, and cranial nerves II through XII grossly intact. Breast/Rectal/Genital:  Not indicated for an orthopedic evaluation. Musculoskeletal:  She has range of motion from  about 10 degrees shy of full extension to about 115 degrees today.  She does have an effusion.  She does have some pseudolaxity with varus and valgus stressing but does have endpoints noted.  Calf is supple and nontender.  Neurovascularly intact distally.    RADIOGRAPHS:  Radiographic studies reveal bone-on-bone OA of the left knee.   CLINICAL IMPRESSION:   1.  Endstage OA of the left knee. 2.  History of hypertension. 3.  Hypothyroidism secondary to thyroidectomy. 4.  Pneumonia in January 2017. 5.  Depression.   RECOMMENDATIONS:  At this time I have reviewed a form from  Dr. Redmond School who felt that she is a candidate for surgery and feels she is cleared for surgery from a medical and cardiac standpoint.  Therefore, our plan is proceed with a right total knee arthroplasty.  The procedure, risks, and benefits were fully explained to her in detail.  Appropriate models were used to show the prosthesis and the surgery.  All questions were answered.    Originally was to have surgery in May but had a family crisis.  No change in history or exam.  Katie Kaiser. [place], [place] [phone]  11/22/2015 1:49 PM

## 2015-11-26 MED ORDER — TRANEXAMIC ACID 1000 MG/10ML IV SOLN
2000.0000 mg | INTRAVENOUS | Status: AC
  Administered 2015-11-27: 2000 mg via TOPICAL
  Filled 2015-11-26: qty 20

## 2015-11-26 MED ORDER — CEFAZOLIN SODIUM-DEXTROSE 2-4 GM/100ML-% IV SOLN
2.0000 g | INTRAVENOUS | Status: AC
  Administered 2015-11-27: 2 g via INTRAVENOUS
  Filled 2015-11-26: qty 100

## 2015-11-26 NOTE — Anesthesia Preprocedure Evaluation (Addendum)
Anesthesia Evaluation  Patient identified by MRN, date of birth, ID band Patient awake    Reviewed: Allergy & Precautions, H&P , Patient's Chart, lab work & pertinent test results, reviewed documented beta blocker date and time   Airway Mallampati: II  TM Distance: >3 FB Neck ROM: full    Dental no notable dental hx.    Pulmonary former smoker,    Pulmonary exam normal breath sounds clear to auscultation       Cardiovascular hypertension,  Rhythm:regular Rate:Normal     Neuro/Psych    GI/Hepatic GERD  ,  Endo/Other  Morbid obesity  Renal/GU      Musculoskeletal   Abdominal   Peds  Hematology   Anesthesia Other Findings   Reproductive/Obstetrics                             Anesthesia Physical Anesthesia Plan  ASA: III  Anesthesia Plan: Spinal   Post-op Pain Management:    Induction: Intravenous  Airway Management Planned: Natural Airway and Simple Face Mask  Additional Equipment:   Intra-op Plan:   Post-operative Plan:   Informed Consent: I have reviewed the patients History and Physical, chart, labs and discussed the procedure including the risks, benefits and alternatives for the proposed anesthesia with the patient or authorized representative who has indicated his/her understanding and acceptance.   Dental Advisory Given and Dental advisory given  Plan Discussed with: CRNA and Surgeon  Anesthesia Plan Comments: (Discussed GA with LMA, possible sore throat, potential need to switch to ETT, N/V, pulmonary aspiration. Questions answered. )       Anesthesia Quick Evaluation

## 2015-11-27 ENCOUNTER — Encounter (HOSPITAL_COMMUNITY): Payer: Self-pay | Admitting: General Practice

## 2015-11-27 ENCOUNTER — Inpatient Hospital Stay (HOSPITAL_COMMUNITY): Payer: Medicare Other | Admitting: Certified Registered"

## 2015-11-27 ENCOUNTER — Inpatient Hospital Stay (HOSPITAL_COMMUNITY): Payer: Medicare Other | Admitting: Emergency Medicine

## 2015-11-27 ENCOUNTER — Inpatient Hospital Stay (HOSPITAL_COMMUNITY)
Admission: RE | Admit: 2015-11-27 | Discharge: 2015-11-30 | DRG: 470 | Disposition: A | Discharge status: Home Health | Payer: Medicare Other | Source: Ambulatory Visit | Attending: Orthopaedic Surgery | Admitting: Orthopaedic Surgery

## 2015-11-27 ENCOUNTER — Encounter (HOSPITAL_COMMUNITY)
Admission: RE | Disposition: A | Discharge status: Home Health | Payer: Self-pay | Source: Ambulatory Visit | Attending: Orthopaedic Surgery

## 2015-11-27 DIAGNOSIS — N3281 Overactive bladder: Secondary | ICD-10-CM | POA: Diagnosis present

## 2015-11-27 DIAGNOSIS — F329 Major depressive disorder, single episode, unspecified: Secondary | ICD-10-CM | POA: Diagnosis present

## 2015-11-27 DIAGNOSIS — G8918 Other acute postprocedural pain: Secondary | ICD-10-CM | POA: Diagnosis not present

## 2015-11-27 DIAGNOSIS — E890 Postprocedural hypothyroidism: Secondary | ICD-10-CM | POA: Diagnosis present

## 2015-11-27 DIAGNOSIS — I10 Essential (primary) hypertension: Secondary | ICD-10-CM | POA: Diagnosis present

## 2015-11-27 DIAGNOSIS — E669 Obesity, unspecified: Secondary | ICD-10-CM | POA: Diagnosis present

## 2015-11-27 DIAGNOSIS — M179 Osteoarthritis of knee, unspecified: Secondary | ICD-10-CM | POA: Diagnosis not present

## 2015-11-27 DIAGNOSIS — Z87891 Personal history of nicotine dependence: Secondary | ICD-10-CM

## 2015-11-27 DIAGNOSIS — R011 Cardiac murmur, unspecified: Secondary | ICD-10-CM | POA: Diagnosis present

## 2015-11-27 DIAGNOSIS — M659 Synovitis and tenosynovitis, unspecified: Secondary | ICD-10-CM | POA: Diagnosis present

## 2015-11-27 DIAGNOSIS — K219 Gastro-esophageal reflux disease without esophagitis: Secondary | ICD-10-CM | POA: Diagnosis present

## 2015-11-27 DIAGNOSIS — M1711 Unilateral primary osteoarthritis, right knee: Secondary | ICD-10-CM | POA: Diagnosis not present

## 2015-11-27 DIAGNOSIS — D62 Acute posthemorrhagic anemia: Secondary | ICD-10-CM | POA: Diagnosis not present

## 2015-11-27 DIAGNOSIS — E871 Hypo-osmolality and hyponatremia: Secondary | ICD-10-CM | POA: Diagnosis not present

## 2015-11-27 DIAGNOSIS — E559 Vitamin D deficiency, unspecified: Secondary | ICD-10-CM | POA: Diagnosis present

## 2015-11-27 DIAGNOSIS — Z79899 Other long term (current) drug therapy: Secondary | ICD-10-CM | POA: Diagnosis not present

## 2015-11-27 DIAGNOSIS — Z6839 Body mass index (BMI) 39.0-39.9, adult: Secondary | ICD-10-CM | POA: Diagnosis not present

## 2015-11-27 DIAGNOSIS — Z96659 Presence of unspecified artificial knee joint: Secondary | ICD-10-CM

## 2015-11-27 HISTORY — PX: TOTAL KNEE ARTHROPLASTY: SHX125

## 2015-11-27 HISTORY — DX: Essential (primary) hypertension: I10

## 2015-11-27 SURGERY — ARTHROPLASTY, KNEE, TOTAL
Anesthesia: General | Site: Knee | Laterality: Right

## 2015-11-27 MED ORDER — ACETAMINOPHEN 10 MG/ML IV SOLN
INTRAVENOUS | Status: AC
  Filled 2015-11-27: qty 100

## 2015-11-27 MED ORDER — ONDANSETRON HCL 4 MG/2ML IJ SOLN
INTRAMUSCULAR | Status: DC | PRN
  Administered 2015-11-27: 4 mg via INTRAVENOUS

## 2015-11-27 MED ORDER — DOCUSATE SODIUM 100 MG PO CAPS
100.0000 mg | ORAL_CAPSULE | Freq: Two times a day (BID) | ORAL | Status: DC
Start: 2015-11-27 — End: 2015-11-30
  Administered 2015-11-27 – 2015-11-30 (×7): 100 mg via ORAL
  Filled 2015-11-27 (×7): qty 1

## 2015-11-27 MED ORDER — HYDROMORPHONE HCL 1 MG/ML IJ SOLN
0.5000 mg | INTRAMUSCULAR | Status: DC | PRN
  Administered 2015-11-27: 0.5 mg via INTRAVENOUS
  Administered 2015-11-28 – 2015-11-29 (×6): 1 mg via INTRAVENOUS
  Filled 2015-11-27 (×7): qty 1

## 2015-11-27 MED ORDER — BUPIVACAINE IN DEXTROSE 0.75-8.25 % IT SOLN
INTRATHECAL | Status: DC | PRN
Start: 2015-11-27 — End: 2015-11-27
  Administered 2015-11-27: 1.9 mL via INTRATHECAL

## 2015-11-27 MED ORDER — FENTANYL CITRATE (PF) 250 MCG/5ML IJ SOLN
INTRAMUSCULAR | Status: AC
  Filled 2015-11-27: qty 5

## 2015-11-27 MED ORDER — LACTATED RINGERS IV SOLN
INTRAVENOUS | Status: DC | PRN
  Administered 2015-11-27 (×3): via INTRAVENOUS

## 2015-11-27 MED ORDER — BUPROPION HCL ER (XL) 150 MG PO TB24
300.0000 mg | ORAL_TABLET | Freq: Every day | ORAL | Status: DC
  Administered 2015-11-27 – 2015-11-30 (×4): 300 mg via ORAL
  Filled 2015-11-27 (×4): qty 2

## 2015-11-27 MED ORDER — METHOCARBAMOL 1000 MG/10ML IJ SOLN
500.0000 mg | Freq: Four times a day (QID) | INTRAVENOUS | Status: DC | PRN
  Filled 2015-11-27: qty 5

## 2015-11-27 MED ORDER — PHENYLEPHRINE HCL 10 MG/ML IJ SOLN
INTRAMUSCULAR | Status: DC | PRN
  Administered 2015-11-27 (×2): 40 ug via INTRAVENOUS

## 2015-11-27 MED ORDER — CHLORHEXIDINE GLUCONATE 4 % EX LIQD: 60.0000 mL | Freq: Once | CUTANEOUS | Status: DC

## 2015-11-27 MED ORDER — ACETAMINOPHEN 10 MG/ML IV SOLN
1000.0000 mg | Freq: Once | INTRAVENOUS | Status: AC
  Administered 2015-11-27: 1000 mg via INTRAVENOUS

## 2015-11-27 MED ORDER — CEFAZOLIN SODIUM-DEXTROSE 2-4 GM/100ML-% IV SOLN
2.0000 g | Freq: Four times a day (QID) | INTRAVENOUS | Status: AC
  Administered 2015-11-27 (×2): 2 g via INTRAVENOUS
  Filled 2015-11-27 (×3): qty 100

## 2015-11-27 MED ORDER — BUPIVACAINE-EPINEPHRINE 0.25% -1:200000 IJ SOLN
INTRAMUSCULAR | Status: DC | PRN
  Administered 2015-11-27: 30 mL

## 2015-11-27 MED ORDER — PHENOL 1.4 % MT LIQD: 1.0000 | OROMUCOSAL | Status: DC | PRN

## 2015-11-27 MED ORDER — POLYETHYLENE GLYCOL 3350 17 G PO PACK: 17.0000 g | PACK | Freq: Every day | ORAL | Status: DC | PRN

## 2015-11-27 MED ORDER — SODIUM CHLORIDE 0.9 % IR SOLN
Status: DC | PRN
  Administered 2015-11-27: 3000 mL

## 2015-11-27 MED ORDER — SODIUM CHLORIDE 0.9 % IV SOLN: INTRAVENOUS | Status: DC

## 2015-11-27 MED ORDER — BUPIVACAINE-EPINEPHRINE (PF) 0.25% -1:200000 IJ SOLN
INTRAMUSCULAR | Status: AC
  Filled 2015-11-27: qty 30

## 2015-11-27 MED ORDER — HYDROMORPHONE HCL 1 MG/ML IJ SOLN: 0.2500 mg | INTRAMUSCULAR | Status: DC | PRN

## 2015-11-27 MED ORDER — LIDOCAINE 2% (20 MG/ML) 5 ML SYRINGE
INTRAMUSCULAR | Status: AC
  Filled 2015-11-27: qty 5

## 2015-11-27 MED ORDER — HYDROCHLOROTHIAZIDE 25 MG PO TABS
25.0000 mg | ORAL_TABLET | Freq: Every day | ORAL | Status: DC
  Administered 2015-11-28 – 2015-11-30 (×2): 25 mg via ORAL
  Filled 2015-11-27 (×3): qty 1

## 2015-11-27 MED ORDER — DIPHENHYDRAMINE HCL 12.5 MG/5ML PO ELIX
12.5000 mg | ORAL_SOLUTION | ORAL | Status: DC | PRN
  Administered 2015-11-28: 12.5 mg via ORAL
  Administered 2015-11-28: 25 mg via ORAL
  Filled 2015-11-27 (×2): qty 10

## 2015-11-27 MED ORDER — ONDANSETRON HCL 4 MG PO TABS: 4.0000 mg | ORAL_TABLET | Freq: Four times a day (QID) | ORAL | Status: DC | PRN

## 2015-11-27 MED ORDER — PROPOFOL 1000 MG/100ML IV EMUL
INTRAVENOUS | Status: AC
  Filled 2015-11-27: qty 300

## 2015-11-27 MED ORDER — ONDANSETRON HCL 4 MG/2ML IJ SOLN
4.0000 mg | Freq: Four times a day (QID) | INTRAMUSCULAR | Status: DC | PRN
  Administered 2015-11-27: 4 mg via INTRAVENOUS
  Filled 2015-11-27: qty 2

## 2015-11-27 MED ORDER — LIDOCAINE HCL (CARDIAC) 20 MG/ML IV SOLN
INTRAVENOUS | Status: DC | PRN
  Administered 2015-11-27 (×2): 20 mg via INTRAVENOUS

## 2015-11-27 MED ORDER — MIDAZOLAM HCL 2 MG/2ML IJ SOLN
INTRAMUSCULAR | Status: AC
  Filled 2015-11-27: qty 2

## 2015-11-27 MED ORDER — HYDROMORPHONE HCL 2 MG PO TABS
2.0000 mg | ORAL_TABLET | ORAL | Status: DC | PRN
Start: 2015-11-27 — End: 2015-11-29
  Administered 2015-11-27 – 2015-11-29 (×7): 2 mg via ORAL
  Filled 2015-11-27 (×8): qty 1

## 2015-11-27 MED ORDER — PROPOFOL 10 MG/ML IV BOLUS
INTRAVENOUS | Status: DC | PRN
  Administered 2015-11-27: 20 mg via INTRAVENOUS
  Administered 2015-11-27: 10 mg via INTRAVENOUS

## 2015-11-27 MED ORDER — METHOCARBAMOL 500 MG PO TABS
500.0000 mg | ORAL_TABLET | Freq: Four times a day (QID) | ORAL | Status: DC | PRN
  Administered 2015-11-28 – 2015-11-30 (×6): 500 mg via ORAL
  Filled 2015-11-27 (×6): qty 1

## 2015-11-27 MED ORDER — LEVOTHYROXINE SODIUM 100 MCG PO TABS
200.0000 ug | ORAL_TABLET | Freq: Every day | ORAL | Status: DC
  Administered 2015-11-28 – 2015-11-30 (×3): 200 ug via ORAL
  Filled 2015-11-27 (×4): qty 2

## 2015-11-27 MED ORDER — DEXTROSE 5 % IV SOLN
INTRAVENOUS | Status: DC | PRN
  Administered 2015-11-27: 25 ug/min via INTRAVENOUS

## 2015-11-27 MED ORDER — SODIUM CHLORIDE 0.9 % IV SOLN
INTRAVENOUS | Status: DC
  Administered 2015-11-27: 12:00:00 via INTRAVENOUS

## 2015-11-27 MED ORDER — MENTHOL 3 MG MT LOZG
1.0000 | LOZENGE | OROMUCOSAL | Status: DC | PRN
  Administered 2015-11-28: 3 mg via ORAL
  Filled 2015-11-27 (×2): qty 9

## 2015-11-27 MED ORDER — FLUOXETINE HCL 20 MG PO CAPS
40.0000 mg | ORAL_CAPSULE | Freq: Every day | ORAL | Status: DC
  Administered 2015-11-28 – 2015-11-30 (×3): 40 mg via ORAL
  Filled 2015-11-27 (×2): qty 2
  Filled 2015-11-27 (×2): qty 4
  Filled 2015-11-27: qty 2
  Filled 2015-11-27: qty 4

## 2015-11-27 MED ORDER — METOCLOPRAMIDE HCL 5 MG/ML IJ SOLN: 5.0000 mg | Freq: Three times a day (TID) | INTRAMUSCULAR | Status: DC | PRN

## 2015-11-27 MED ORDER — MIDAZOLAM HCL 5 MG/5ML IJ SOLN
INTRAMUSCULAR | Status: DC | PRN
  Administered 2015-11-27: 2 mg via INTRAVENOUS

## 2015-11-27 MED ORDER — MAGNESIUM CITRATE PO SOLN: 1.0000 | Freq: Once | ORAL | Status: DC | PRN

## 2015-11-27 MED ORDER — ALUM & MAG HYDROXIDE-SIMETH 200-200-20 MG/5ML PO SUSP: 30.0000 mL | ORAL | Status: DC | PRN

## 2015-11-27 MED ORDER — METOCLOPRAMIDE HCL 5 MG PO TABS: 5.0000 mg | ORAL_TABLET | Freq: Three times a day (TID) | ORAL | Status: DC | PRN

## 2015-11-27 MED ORDER — ACETAMINOPHEN 10 MG/ML IV SOLN
1000.0000 mg | Freq: Four times a day (QID) | INTRAVENOUS | Status: AC
  Administered 2015-11-27 – 2015-11-28 (×4): 1000 mg via INTRAVENOUS
  Filled 2015-11-27 (×5): qty 100

## 2015-11-27 MED ORDER — PROPOFOL 500 MG/50ML IV EMUL
INTRAVENOUS | Status: DC | PRN
Start: 2015-11-27 — End: 2015-11-27
  Administered 2015-11-27: 100 ug/kg/min via INTRAVENOUS

## 2015-11-27 MED ORDER — RIVAROXABAN 10 MG PO TABS
10.0000 mg | ORAL_TABLET | Freq: Every day | ORAL | Status: DC
  Administered 2015-11-28 – 2015-11-30 (×3): 10 mg via ORAL
  Filled 2015-11-27 (×3): qty 1

## 2015-11-27 MED ORDER — 0.9 % SODIUM CHLORIDE (POUR BTL) OPTIME
TOPICAL | Status: DC | PRN
  Administered 2015-11-27: 1000 mL

## 2015-11-27 MED ORDER — ONDANSETRON HCL 4 MG/2ML IJ SOLN
INTRAMUSCULAR | Status: AC
  Filled 2015-11-27: qty 2

## 2015-11-27 MED ORDER — PANTOPRAZOLE SODIUM 40 MG PO TBEC
40.0000 mg | DELAYED_RELEASE_TABLET | Freq: Every day | ORAL | Status: DC
  Administered 2015-11-27 – 2015-11-30 (×4): 40 mg via ORAL
  Filled 2015-11-27 (×4): qty 1

## 2015-11-27 MED ORDER — KETOROLAC TROMETHAMINE 15 MG/ML IJ SOLN
15.0000 mg | Freq: Four times a day (QID) | INTRAMUSCULAR | Status: AC
  Administered 2015-11-27 – 2015-11-28 (×4): 15 mg via INTRAVENOUS
  Filled 2015-11-27 (×4): qty 1

## 2015-11-27 MED ORDER — PROPOFOL 10 MG/ML IV BOLUS
INTRAVENOUS | Status: AC
  Filled 2015-11-27: qty 20

## 2015-11-27 MED ORDER — BISACODYL 10 MG RE SUPP: 10.0000 mg | Freq: Every day | RECTAL | Status: DC | PRN

## 2015-11-27 MED ORDER — OXYBUTYNIN CHLORIDE ER 10 MG PO TB24
10.0000 mg | ORAL_TABLET | Freq: Every day | ORAL | Status: DC
  Administered 2015-11-27 – 2015-11-29 (×3): 10 mg via ORAL
  Filled 2015-11-27 (×3): qty 1

## 2015-11-27 SURGICAL SUPPLY — 62 items
BAG DECANTER FOR FLEXI CONT (MISCELLANEOUS) ×2 IMPLANT
BANDAGE ESMARK 6X9 LF (GAUZE/BANDAGES/DRESSINGS) ×1 IMPLANT
BLADE SAGITTAL 25.0X1.19X90 (BLADE) ×2 IMPLANT
BNDG CMPR 9X6 STRL LF SNTH (GAUZE/BANDAGES/DRESSINGS) ×1
BNDG ESMARK 6X9 LF (GAUZE/BANDAGES/DRESSINGS) ×2
BOWL SMART MIX CTS (DISPOSABLE) ×2 IMPLANT
CAP KNEE TOTAL 3 SIGMA ×1 IMPLANT
CEMENT HV SMART SET (Cement) ×4 IMPLANT
COVER SURGICAL LIGHT HANDLE (MISCELLANEOUS) ×2 IMPLANT
CUFF TOURNIQUET SINGLE 34IN LL (TOURNIQUET CUFF) IMPLANT
CUFF TOURNIQUET SINGLE 44IN (TOURNIQUET CUFF) IMPLANT
DECANTER SPIKE VIAL GLASS SM (MISCELLANEOUS) ×2 IMPLANT
DRAPE EXTREMITY T 121X128X90 (DRAPE) ×2 IMPLANT
DRAPE PROXIMA HALF (DRAPES) ×2 IMPLANT
DRSG ADAPTIC 3X8 NADH LF (GAUZE/BANDAGES/DRESSINGS) ×2 IMPLANT
DRSG PAD ABDOMINAL 8X10 ST (GAUZE/BANDAGES/DRESSINGS) ×3 IMPLANT
DURAPREP 26ML APPLICATOR (WOUND CARE) ×4 IMPLANT
ELECT CAUTERY BLADE 6.4 (BLADE) ×2 IMPLANT
ELECT REM PT RETURN 9FT ADLT (ELECTROSURGICAL) ×2
ELECTRODE REM PT RTRN 9FT ADLT (ELECTROSURGICAL) ×1 IMPLANT
EVACUATOR 1/8 PVC DRAIN (DRAIN) IMPLANT
FACESHIELD WRAPAROUND (MASK) ×4 IMPLANT
FACESHIELD WRAPAROUND OR TEAM (MASK) ×2 IMPLANT
GAUZE SPONGE 4X4 12PLY STRL (GAUZE/BANDAGES/DRESSINGS) ×2 IMPLANT
GLOVE BIOGEL PI IND STRL 8 (GLOVE) ×1 IMPLANT
GLOVE BIOGEL PI IND STRL 8.5 (GLOVE) ×1 IMPLANT
GLOVE BIOGEL PI INDICATOR 8 (GLOVE) ×1
GLOVE BIOGEL PI INDICATOR 8.5 (GLOVE) ×1
GLOVE ECLIPSE 8.0 STRL XLNG CF (GLOVE) ×4 IMPLANT
GLOVE SURG ORTHO 8.5 STRL (GLOVE) ×4 IMPLANT
GOWN STRL REUS W/ TWL LRG LVL3 (GOWN DISPOSABLE) ×2 IMPLANT
GOWN STRL REUS W/TWL 2XL LVL3 (GOWN DISPOSABLE) ×2 IMPLANT
GOWN STRL REUS W/TWL LRG LVL3 (GOWN DISPOSABLE) ×4
HANDPIECE INTERPULSE COAX TIP (DISPOSABLE) ×2
KIT BASIN OR (CUSTOM PROCEDURE TRAY) ×2 IMPLANT
KIT ROOM TURNOVER OR (KITS) ×2 IMPLANT
MANIFOLD NEPTUNE II (INSTRUMENTS) ×2 IMPLANT
NEEDLE 22X1 1/2 (OR ONLY) (NEEDLE) ×2 IMPLANT
NS IRRIG 1000ML POUR BTL (IV SOLUTION) ×2 IMPLANT
PACK TOTAL JOINT (CUSTOM PROCEDURE TRAY) ×2 IMPLANT
PAD ARMBOARD 7.5X6 YLW CONV (MISCELLANEOUS) ×4 IMPLANT
PAD CAST 4YDX4 CTTN HI CHSV (CAST SUPPLIES) ×1 IMPLANT
PADDING CAST COTTON 4X4 STRL (CAST SUPPLIES) ×2
PADDING CAST COTTON 6X4 STRL (CAST SUPPLIES) ×2 IMPLANT
SET HNDPC FAN SPRY TIP SCT (DISPOSABLE) ×1 IMPLANT
SPONGE LAP 18X18 X RAY DECT (DISPOSABLE) ×1 IMPLANT
STAPLER VISISTAT 35W (STAPLE) ×2 IMPLANT
SUCTION FRAZIER HANDLE 10FR (MISCELLANEOUS) ×1
SUCTION TUBE FRAZIER 10FR DISP (MISCELLANEOUS) ×1 IMPLANT
SURGIFLO W/THROMBIN 8M KIT (HEMOSTASIS) IMPLANT
SUT BONE WAX W31G (SUTURE) ×2 IMPLANT
SUT ETHIBOND NAB CT1 #1 30IN (SUTURE) ×5 IMPLANT
SUT MNCRL AB 3-0 PS2 18 (SUTURE) ×2 IMPLANT
SUT VIC AB 0 CT1 27 (SUTURE) ×2
SUT VIC AB 0 CT1 27XBRD ANBCTR (SUTURE) ×1 IMPLANT
SYR CONTROL 10ML LL (SYRINGE) IMPLANT
TOWEL OR 17X24 6PK STRL BLUE (TOWEL DISPOSABLE) ×2 IMPLANT
TOWEL OR 17X26 10 PK STRL BLUE (TOWEL DISPOSABLE) ×2 IMPLANT
TRAY FOLEY BAG SILVER LF 16FR (SET/KITS/TRAYS/PACK) ×2 IMPLANT
UPCHARGE REV TRAY MBT KNEE ×1 IMPLANT
WATER STERILE IRR 1000ML POUR (IV SOLUTION) ×2 IMPLANT
WRAP KNEE MAXI GEL POST OP (GAUZE/BANDAGES/DRESSINGS) ×2 IMPLANT

## 2015-11-27 NOTE — H&P (Signed)
  The recent History & Physical has been reviewed. I have personally examined the patient today. There is no interval change to the documented History & Physical. The patient would like to proceed with the procedure.  Joni Fears W 11/27/2015,  7:05 AM  The recent History & Physical has been reviewed. I have personally examined the patient today. There is no interval change to the documented History & Physical. The patient would like to proceed with the procedure.  Joni Fears W 11/27/2015,  7:05 AM

## 2015-11-27 NOTE — Progress Notes (Signed)
Orthopedic Tech Progress Note Patient Details:  Katie Kaiser [birth year] [id number]  CPM Right Knee CPM Right Knee: On Right Knee Flexion (Degrees): 90 Right Knee Extension (Degrees): 0 Additional Comments: Trapeze bar   [name] 11/27/2015, 4:14 PM

## 2015-11-27 NOTE — Progress Notes (Signed)
   11/27/15 1345  Clinical Encounter Type  Visited With Patient  Visit Type Initial  Spiritual Encounters  Spiritual Needs Prayer  On afternoon rounds, Chaplain visited with patient.  Chaplain and patient engaged in a lovely conversation.  Chaplain prayed for patient and made further support available if needed.

## 2015-11-27 NOTE — Transfer of Care (Signed)
Immediate Anesthesia Transfer of Care Note  Patient: Katie Kaiser  Procedure(s) Performed: Procedure(s): TOTAL KNEE ARTHROPLASTY (Right)  Patient Location: PACU  Anesthesia Type:Spinal  Level of Consciousness:  sedated, patient cooperative and responds to stimulation  Airway & Oxygen Therapy:Patient Spontanous Breathing and Patient connected to face mask oxgen  Post-op Assessment:  Report given to PACU RN and Post -op Vital signs reviewed and stable  Post vital signs:  Reviewed and stable  Last Vitals:  Vitals:   11/27/15 0625  BP: (!) 153/65  Pulse: 70  Resp: 20  Temp: 123XX123 C    Complications: No apparent anesthesia complications

## 2015-11-27 NOTE — Anesthesia Postprocedure Evaluation (Signed)
Anesthesia Post Note  Patient: Katie Kaiser  Procedure(s) Performed: Procedure(s) (LRB): TOTAL KNEE ARTHROPLASTY (Right)  Patient location during evaluation: PACU Anesthesia Type: Spinal Level of consciousness: awake Pain management: satisfactory to patient Vital Signs Assessment: post-procedure vital signs reviewed and stable Respiratory status: spontaneous breathing Cardiovascular status: blood pressure returned to baseline Postop Assessment: no headache and spinal receding Anesthetic complications: no    Last Vitals:  Vitals:   11/27/15 1133 11/27/15 1439  BP: (!) 145/70 (!) 159/63  Pulse: (!) 56 (!) 59  Resp: 16 16  Temp: 36.4 C 36.4 C    Last Pain:  Vitals:   11/27/15 1115  TempSrc:   PainSc: Asleep                 Katelynd Blauvelt EDWARD

## 2015-11-27 NOTE — Evaluation (Signed)
Physical Therapy Evaluation Patient Details Name: Katie Kaiser MRN: [id number] DOB: 1945/11/02 Today's Date: 11/27/2015   History of Present Illness  70 y.o. female s/p Rt TKA. PMH: depression  Clinical Impression  Pt is s/p TKA resulting in the deficits listed below (see PT Problem List).  Pt will benefit from skilled PT to increase their independence and safety with mobility to allow discharge to home with friends to support. Pt able to stand EOB during initial session, unable to attempt steps due to increasing nausea. Will continue to progress as tolerated.       Follow Up Recommendations Home health PT;Supervision for mobility/OOB    Equipment Recommendations  Rolling walker with 5" wheels    Recommendations for Other Services       Precautions / Restrictions Precautions Precautions: Knee;Fall Precaution Booklet Issued: Yes (comment) Precaution Comments: HEP provided, reviewed knee extension precautions Restrictions Weight Bearing Restrictions: Yes RLE Weight Bearing: Partial weight bearing RLE Partial Weight Bearing Percentage or Pounds: 50%      Mobility  Bed Mobility Overal bed mobility: Needs Assistance Bed Mobility: Supine to Sit;Sit to Supine     Supine to sit: Mod assist;HOB elevated Sit to supine: Mod assist   General bed mobility comments: requiring mod assist with Rt LE, cues to utilize rail to get to sitting EOB  Transfers Overall transfer level: Needs assistance Equipment used: Rolling walker (2 wheeled) Transfers: Sit to/from Stand Sit to Stand: Mod assist         General transfer comment: cues for hand position. Pt becoming nauseated once standing, returned to sitting EOB.   Ambulation/Gait             General Gait Details: unable to attempt  Stairs            Wheelchair Mobility    Modified Rankin (Stroke Patients Only)       Balance Overall balance assessment: Needs assistance Sitting-balance support: No upper  extremity supported Sitting balance-Leahy Scale: Fair     Standing balance support: Bilateral upper extremity supported Standing balance-Leahy Scale: Poor Standing balance comment: using rw                             Pertinent Vitals/Pain Pain Assessment: 0-10 Pain Score: 5  Pain Location: rt knee, stomach Pain Descriptors / Indicators: Aching;Sore;Cramping    Home Living Family/patient expects to be discharged to:: Private residence Living Arrangements: Alone Available Help at Discharge: Friend(s);Available 24 hours/day Type of Home: House Home Access: Level entry     Home Layout: One level;Able to live on main level with bedroom/bathroom (3 steps with rail to bedroom) Home Equipment: Tub bench Additional Comments: will have friends staying with her at D/C    Prior Function Level of Independence: Independent         Comments: works as Estate manager/land agent        Extremity/Trunk Assessment   Upper Extremity Assessment: Overall WFL for tasks assessed           Lower Extremity Assessment: RLE deficits/detail RLE Deficits / Details: unable to perform SLR without assist       Communication   Communication: No difficulties  Cognition Arousal/Alertness: Awake/alert Behavior During Therapy: WFL for tasks assessed/performed Overall Cognitive Status: Within Functional Limits for tasks assessed                      General  Comments General comments (skin integrity, edema, etc.): Pt with reports of nausea once standing, returned to sitting EOB. Unable to attempt ambulation. Nursing notified and addressing nausea after session.     Exercises        Assessment/Plan    PT Assessment Patient needs continued PT services  PT Diagnosis Difficulty walking   PT Problem List Decreased strength;Decreased range of motion;Decreased activity tolerance;Decreased balance;Decreased mobility  PT Treatment Interventions DME instruction;Gait  training;Stair training;Functional mobility training;Therapeutic activities;Therapeutic exercise;Patient/family education   PT Goals (Current goals can be found in the Care Plan section) Acute Rehab PT Goals Patient Stated Goal: return home and be able to play with grandkids PT Goal Formulation: With patient Time For Goal Achievement: 12/11/15 Potential to Achieve Goals: Good    Frequency 7X/week   Barriers to discharge        Co-evaluation               End of Session Equipment Utilized During Treatment: Gait belt Activity Tolerance:  (limited by nausea) Patient left: in bed;with call bell/phone within reach;with nursing/sitter in room (in knee extension) Nurse Communication: Mobility status         Time: [id number] PT Time Calculation (min) (ACUTE ONLY): 42 min   Charges:   PT Evaluation $PT Eval Moderate Complexity: 1 Procedure PT Treatments $Therapeutic Activity: 23-37 mins   PT G Codes:        Cassell Clement, PT, CSCS Pager [phone] Office [phone]  11/27/2015, 4:01 PM

## 2015-11-27 NOTE — Op Note (Signed)
PATIENT ID:      Katie Kaiser  MRN:     [id number] DOB/AGE:    1945-08-02 / 70 y.o.       OPERATIVE REPORT    DATE OF PROCEDURE:  11/27/2015       PREOPERATIVE DIAGNOSIS:   RIGHT KNEE END STAGE OSTEOARTHRITIS                                                       Estimated body mass index is 39.11 kg/m as calculated from the following:   Height as of this encounter: 5\' 5"  (1.651 m).   Weight as of this encounter: 106.6 kg (235 lb).     POSTOPERATIVE DIAGNOSIS:   RIGHT KNEE OSTEOARTHRITIS -SAME                                                                    Estimated body mass index is 39.11 kg/m as calculated from the following:   Height as of this encounter: 5\' 5"  (1.651 m).   Weight as of this encounter: 106.6 kg (235 lb).     PROCEDURE:  Procedure(s):RIGHT TOTAL KNEE ARTHROPLASTY      SURGEON:  Joni Fears, MD    ASSISTANT:   Biagio Borg, PA-C   (Present and scrubbed throughout the case, critical for assistance with exposure, retraction, instrumentation, and closure.)          ANESTHESIA: regional and spinal     DRAINS: (RIGHT KNEE) Hemovact drain(s) in the CLAMPED with  Suction Clamped :      TOURNIQUET TIME:  Total Tourniquet Time Documented: Thigh (Right) - 67 minutes Total: Thigh (Right) - 67 minutes     COMPLICATIONS:  None   CONDITION:  stable  PROCEDURE IN [id number]   Durward Fortes, Albaraa Swingle W 11/27/2015, 9:14 AM

## 2015-11-27 NOTE — Progress Notes (Signed)
Orthopedic Tech Progress Note Patient Details:  Katie Kaiser 01-20-1946 [id number]  CPM Right Knee CPM Right Knee: On Right Knee Flexion (Degrees): 90 Right Knee Extension (Degrees): 0 Additional Comments: Trapeze bar   [name] 11/27/2015, 10:37 AM

## 2015-11-27 NOTE — Anesthesia Procedure Notes (Signed)
Anesthesia Regional Block:  Adductor canal block  Pre-Anesthetic Checklist: ,, timeout performed, Correct Patient, Correct Site, Correct Laterality, Correct Procedure, Correct Position, site marked, Risks and benefits discussed,  Laterality: Right  Prep: chloraprep       Needles:   Needle Type: Echogenic Needle     Needle Length: 9cm 9 cm Needle Gauge: 21 and 21 G    Additional Needles:  Procedures: ultrasound guided (picture in chart) Adductor canal block Narrative:  Injection made incrementally with aspirations every 5 mL. Anesthesiologist: Lyndle Herrlich  Additional Notes: 30cc .5% Naropin

## 2015-11-27 NOTE — Anesthesia Procedure Notes (Signed)
Spinal  Patient location during procedure: OR Preanesthetic Checklist Completed: patient identified, site marked, surgical consent, pre-op evaluation, timeout performed, IV checked, risks and benefits discussed and monitors and equipment checked Spinal Block Patient position: sitting Prep: DuraPrep Patient monitoring: heart rate, cardiac monitor, continuous pulse ox and blood pressure Approach: midline Location: L3-4 Injection technique: single-shot Needle Needle type: Sprotte  Needle gauge: 24 G Needle length: 9 cm Assessment Sensory level: T4 Additional Notes Spinal Dosage in OR LLD x 2 Min Bupivicaine ml       1.9

## 2015-11-28 ENCOUNTER — Encounter (HOSPITAL_COMMUNITY): Payer: Self-pay | Admitting: Orthopaedic Surgery

## 2015-11-28 LAB — CBC
HCT: 27 % — ABNORMAL LOW (ref 36.0–46.0)
Hemoglobin: 8.6 g/dL — ABNORMAL LOW (ref 12.0–15.0)
MCH: 25.7 pg — AB (ref 26.0–34.0)
MCHC: 31.9 g/dL (ref 30.0–36.0)
MCV: 80.8 fL (ref 78.0–100.0)
PLATELETS: 146 10*3/uL — AB (ref 150–400)
RBC: 3.34 MIL/uL — ABNORMAL LOW (ref 3.87–5.11)
RDW: 15.4 % (ref 11.5–15.5)
WBC: 7 10*3/uL (ref 4.0–10.5)

## 2015-11-28 LAB — BASIC METABOLIC PANEL
Anion gap: 8 (ref 5–15)
BUN: 11 mg/dL (ref 6–20)
CHLORIDE: 99 mmol/L — AB (ref 101–111)
CO2: 28 mmol/L (ref 22–32)
CREATININE: 0.68 mg/dL (ref 0.44–1.00)
Calcium: 8.3 mg/dL — ABNORMAL LOW (ref 8.9–10.3)
GFR calc Af Amer: >60 mL/min (ref >60)
GLUCOSE: 108 mg/dL — AB (ref 65–99)
POTASSIUM: 3.6 mmol/L (ref 3.5–5.1)
SODIUM: 135 mmol/L (ref 135–145)

## 2015-11-28 MED ORDER — ACETAMINOPHEN 500 MG PO TABS
1000.0000 mg | ORAL_TABLET | Freq: Four times a day (QID) | ORAL | Status: DC
  Administered 2015-11-28 – 2015-11-29 (×2): 1000 mg via ORAL
  Filled 2015-11-28 (×2): qty 2

## 2015-11-28 NOTE — Progress Notes (Signed)
Orthopedic Tech Progress Note Patient Details:  Katie Kaiser 1946/01/29 [id number]  Patient ID: [name], female   DOB: Mar 31, 1946, 70 y.o.   MRN: [id number] Applied cpm 0-60  Karolee Stamps 11/28/2015, 6:10 AM

## 2015-11-28 NOTE — Progress Notes (Signed)
Patient ID: Katie Kaiser, female   DOB: 1945-09-25, 70 y.o.   MRN: [id number] PATIENT ID: Katie Kaiser        MRN:  [id number]          DOB/AGE: Sep 29, 1945 / 70 y.o.    Joni Fears, MD   Biagio Borg, PA-C [address] [place], [place]  [address]                             [phone]   PROGRESS NOTE  Subjective:  negative for Chest Pain  negative for Shortness of Breath  negative for Nausea/Vomiting   negative for Calf Pain    Tolerating Diet: yes         Patient reports pain as mild and moderate.       Objective: Vital signs in last 24 hours:   Patient Vitals for the past 24 hrs:  BP Temp Temp src Pulse Resp SpO2  11/28/15 0447 (!) 130/54 99.4 F (37.4 C) Oral 70 16 98 %  11/28/15 0049 140/67 99.1 F (37.3 C) Oral 66 16 96 %  11/27/15 2009 (!) 126/55 98.9 F (37.2 C) Oral 63 16 97 %  11/27/15 1711 - - - - - 96 %  11/27/15 1439 (!) 159/63 97.6 F (36.4 C) - (!) 59 16 99 %  11/27/15 1426 - - - - - 94 %  11/27/15 1333 - - - - - 94 %  11/27/15 1222 - - - - - 95 %  11/27/15 1133 (!) 145/70 97.6 F (36.4 C) - (!) 56 16 100 %  11/27/15 1115 - 97.7 F (36.5 C) - - - -  11/27/15 1100 - - - (!) 54 13 96 %  11/27/15 1048 - - - (!) 54 14 97 %  11/27/15 1036 - - - (!) 54 14 96 %  11/27/15 1030 130/72 - - (!) 55 14 94 %  11/27/15 1024 - - - (!) 55 14 97 %  11/27/15 1012 - - - (!) 57 14 96 %  11/27/15 1000 - - - 60 13 95 %  11/27/15 0952 (!) 113/58 - - 64 13 100 %  11/27/15 0948 (!) 97/49 - - 61 13 100 %  11/27/15 0944 - 97.7 F (36.5 C) - - - -      Intake/Output from previous day:   07/25 0701 - 07/26 0700 In: 3380 [P.O.:480; I.V.:2800] Out: 3900 [Urine:3500; Drains:300]   Intake/Output this shift:   07/26 0701 - 07/26 1900 In: -  Out: 50 [Drains:50]   Intake/Output      07/25 0701 - 07/26 0700 07/26 0701 - 07/27 0700   P.O. 480    I.V. (mL/kg) 2800 (26.3)    IV Piggyback 100    Total Intake(mL/kg) 3380 (31.7)    Urine (mL/kg/hr) 3500  (1.4)    Drains 300 (0.1) 50 (0.5)   Blood 100 (0)    Total Output 3900 50   Net -520 -50           LABORATORY DATA:  Recent Labs  11/28/15 0646  WBC 7.0  HGB 8.6*  HCT 27.0*  PLT 146*    Recent Labs  11/28/15 0646  NA 135  K 3.6  CL 99*  CO2 28  BUN 11  CREATININE 0.68  GLUCOSE 108*  CALCIUM 8.3*   Lab Results  Component Value Date   INR 1.04 11/16/2015    Recent  Radiographic Studies :  Dg Chest 2 View  Result Date: 11/16/2015 CLINICAL DATA:  70 year old female for preoperative respiratory examination prior to right knee replacement. EXAM: CHEST  2 VIEW COMPARISON:  05/21/2015 and 09/05/2011 chest radiographs FINDINGS: Mild cardiomegaly and mild interstitial prominence again noted. There is no evidence of focal airspace disease, pulmonary edema, suspicious pulmonary nodule/mass, pleural effusion, or pneumothorax. No acute bony abnormalities are identified. IMPRESSION: Mild cardiomegaly without evidence of acute cardiopulmonary disease. Mild chronic interstitial prominence. Electronically Signed   By: Margarette Canada M.D.   On: 11/16/2015 15:39     Examination:  General appearance: alert, cooperative, mild distress and moderate distress Resp: clear to auscultation bilaterally Cardio: regular rate and rhythm GI: normal findings: bowel sounds normal  Wound Exam: clean, dry, intact   Drainage:  None: wound tissue dry  Motor Exam: EHL, FHL, Anterior Tibial and Posterior Tibial Intact  Sensory Exam: Superficial Peroneal, Deep Peroneal and Tibial normal  Vascular Exam: Right posterior tibial artery has 1+ (weak) pulse  Assessment:    1 Day Post-Op  Procedure(s) (LRB): TOTAL KNEE ARTHROPLASTY (Right)  ADDITIONAL DIAGNOSIS:  Principal Problem:   Primary osteoarthritis of right knee Active Problems:   S/P total knee replacement using cement   Plan: Physical Therapy as ordered Partial Weight Bearing @ 50% (PWB)  DVT Prophylaxis:  Xarelto, Foot Pumps and TED  hose  DISCHARGE PLAN: Home  DISCHARGE NEEDS: HHPT, CPM, Walker and 3-in-1 comode seat        Hiral Lukasiewicz  11/28/2015 7:54 AM

## 2015-11-28 NOTE — Progress Notes (Signed)
Patient ID: JAQUITTA HEADDEN, female   DOB: 08/11/1945, 70 y.o.   MRN: [id number] PATIENT ID: MAKALLA GROPP        MRN:  [id number]          DOB/AGE: 20-Oct-1945 / 70 y.o.    Joni Fears, MD   Biagio Borg, PA-C [address] [place], [place]  [address]                             [phone]   PROGRESS NOTE  Subjective:  negative for Chest Pain  negative for Shortness of Breath  negative for Nausea/Vomiting-some post op yesterday, but none today   negative for Calf Pain    Tolerating Diet: yes         Patient reports pain as moderate.     comfortable night-pain controlled  Objective: Vital signs in last 24 hours:   Patient Vitals for the past 24 hrs:  BP Temp Temp src Pulse Resp SpO2  11/28/15 0447 (!) 130/54 99.4 F (37.4 C) Oral 70 16 98 %  11/28/15 0049 140/67 99.1 F (37.3 C) Oral 66 16 96 %  11/27/15 2009 (!) 126/55 98.9 F (37.2 C) Oral 63 16 97 %  11/27/15 1711 - - - - - 96 %  11/27/15 1439 (!) 159/63 97.6 F (36.4 C) - (!) 59 16 99 %  11/27/15 1426 - - - - - 94 %  11/27/15 1333 - - - - - 94 %  11/27/15 1222 - - - - - 95 %  11/27/15 1133 (!) 145/70 97.6 F (36.4 C) - (!) 56 16 100 %  11/27/15 1115 - 97.7 F (36.5 C) - - - -  11/27/15 1100 - - - (!) 54 13 96 %  11/27/15 1048 - - - (!) 54 14 97 %  11/27/15 1036 - - - (!) 54 14 96 %  11/27/15 1030 130/72 - - (!) 55 14 94 %  11/27/15 1024 - - - (!) 55 14 97 %  11/27/15 1012 - - - (!) 57 14 96 %  11/27/15 1000 - - - 60 13 95 %  11/27/15 0952 (!) 113/58 - - 64 13 100 %  11/27/15 0948 (!) 97/49 - - 61 13 100 %  11/27/15 0944 - 97.7 F (36.5 C) - - - -      Intake/Output from previous day:   07/25 0701 - 07/26 0700 In: 3380 [P.O.:480; I.V.:2800] Out: 3900 [Urine:3500; Drains:300]   Intake/Output this shift:   No intake/output data recorded.   Intake/Output      07/25 0701 - 07/26 0700 07/26 0701 - 07/27 0700   P.O. 480    I.V. (mL/kg) 2800 (26.3)    IV Piggyback 100    Total Intake(mL/kg)  3380 (31.7)    Urine (mL/kg/hr) 3500 (1.4)    Drains 300 (0.1)    Blood 100 (0)    Total Output 3900     Net -520             LABORATORY DATA: No results for input(s): WBC, HGB, HCT, PLT in the last 168 hours. No results for input(s): NA, K, CL, CO2, BUN, CREATININE, GLUCOSE, CALCIUM in the last 168 hours. Lab Results  Component Value Date   INR 1.04 11/16/2015    Recent Radiographic Studies :  Dg Chest 2 View  Result Date: 11/16/2015 CLINICAL DATA:  70 year old female for preoperative respiratory examination prior  to right knee replacement. EXAM: CHEST  2 VIEW COMPARISON:  05/21/2015 and 09/05/2011 chest radiographs FINDINGS: Mild cardiomegaly and mild interstitial prominence again noted. There is no evidence of focal airspace disease, pulmonary edema, suspicious pulmonary nodule/mass, pleural effusion, or pneumothorax. No acute bony abnormalities are identified. IMPRESSION: Mild cardiomegaly without evidence of acute cardiopulmonary disease. Mild chronic interstitial prominence. Electronically Signed   By: Margarette Canada M.D.   On: 11/16/2015 15:39     Examination:  General appearance: alert, cooperative and no distress  Wound Exam: clean, dry, intact   Drainage:  Moderate amount Serosanguinous exudate  Motor Exam: EHL, FHL, Anterior Tibial and Posterior Tibial Intact  Sensory Exam: Superficial Peroneal, Deep Peroneal and Tibial normal  Vascular Exam: Normal  Assessment:    1 Day Post-Op  Procedure(s) (LRB): TOTAL KNEE ARTHROPLASTY (Right)  ADDITIONAL DIAGNOSIS:  Principal Problem:   Primary osteoarthritis of right knee Active Problems:   S/P total knee replacement using cement     Plan: Physical Therapy as ordered Partial Weight Bearing @ 50% (PWB)  DVT Prophylaxis:  Xarelto, Foot Pumps and TED hose  DISCHARGE PLAN: Home  DISCHARGE NEEDS: HHPT, CPM, Walker and 3-in-1 comode seat     OOB with PT, D/C foley,await lab, VS stable, D/C hemovac in am     Joni Fears W  11/28/2015 7:11 AM

## 2015-11-28 NOTE — Care Management Important Message (Signed)
Important Message  Patient Details  Name: Katie Kaiser MRN: [id number] Date of Birth: [birth year]   Medicare Important Message Given:  Yes    Loann Quill 11/28/2015, 1:16 PM

## 2015-11-28 NOTE — Evaluation (Signed)
Occupational Therapy Evaluation Patient Details Name: Katie Kaiser MRN: [id number] DOB: [birth year] Today's Date: 11/28/2015    History of Present Illness 70 y.o. female s/p Rt TKA. PMH: depression   Clinical Impression   Pt admitted as above, assessment complete. Pt demonstrates deficits in areas of ADL's and functional transfers. See OT problem list below. OT treatment session focused on ADL retraining: toileting transfers, functional mobility into/out of bathroom (3:1 over toilet); grooming seated and discussion of tub transfers. Pt will benefit from cont acute OT to assist with maximizing independence with ADL's and fucntional transfers prior to anticipated d/c home with PRN assistance from family/friends. Plan for tub transfer tomorrow - pt states that she has tub bench/seat at home.    Follow Up Recommendations  Home health OT;Supervision - Intermittent    Equipment Recommendations  None recommended by OT    Recommendations for Other Services       Precautions / Restrictions Precautions Precautions: Knee;Fall Restrictions Weight Bearing Restrictions: Yes RLE Weight Bearing: Partial weight bearing RLE Partial Weight Bearing Percentage or Pounds: 50%      Mobility Bed Mobility Overal bed mobility: Needs Assistance Bed Mobility: Supine to Sit     Supine to sit: Min assist     General bed mobility comments: Pt was up in chair upon OT arrival  Transfers Overall transfer level: Needs assistance Equipment used: Rolling walker (2 wheeled) Transfers: Sit to/from [facility] Sit to Stand: Min assist Stand pivot transfers: Min assist       General transfer comment: cues for RW sequencing    Balance Overall balance assessment: Needs assistance Sitting-balance support: No upper extremity supported Sitting balance-Leahy Scale: Good     Standing balance support: Bilateral upper extremity supported Standing balance-Leahy Scale: Fair Standing  balance comment: Using RW; vc's for safety/sequencing                            ADL Overall ADL's : Needs assistance/impaired Eating/Feeding: Modified independent;Sitting   Grooming: Wash/dry hands;Wash/dry face;Oral care;Brushing hair;Sitting;Independent;Set up   Upper Body Bathing: Set up;Sitting   Lower Body Bathing: Sit to/from stand;Minimal assistance   Upper Body Dressing : Set up;Sitting   Lower Body Dressing: Maximal assistance;Sit to/from stand   Toilet Transfer: Minimal assistance;RW;Ambulation;BSC (3:1 over toilet in bathroom)   Toileting- Clothing Manipulation and Hygiene: Minimal assistance;Sit to/from stand       Functional mobility during ADLs: Minimal assistance;Rolling walker;Cueing for sequencing General ADL Comments: Pt was educated in Role of OT and treatment session focused on ADL retraining: toileting transfers and functional mobility into/out of bathroom, 3:1 over toilet; grooming while seated and discussion of tub transfers. Pt will benefit from cont acute OT to assist with maximizing independence with ADL's and fucntional transfers prior to anticipated d/c home with PRN family/friends.     Vision  Glasses for reading; No change from baseline.   Perception     Praxis      Pertinent Vitals/Pain Pain Assessment: 0-10 Pain Score: 2  Faces Pain Scale: Hurts a little bit Pain Location: R knee Pain Descriptors / Indicators: Sore;Grimacing Pain Intervention(s): Limited activity within patient's tolerance;Monitored during session;Repositioned;Ice applied     Hand Dominance Right   Extremity/Trunk Assessment             Communication Communication Communication: No difficulties   Cognition Arousal/Alertness: Awake/alert Behavior During Therapy: WFL for tasks assessed/performed Overall Cognitive Status: Within Functional Limits for tasks assessed  General Comments       Exercises Exercises: Total  Joint     Shoulder Instructions      Home Living Family/patient expects to be discharged to:: Private residence Living Arrangements: Alone Available Help at Discharge: Friend(s);Available 24 hours/day Type of Home: House Home Access: Level entry     Home Layout: One level;Able to live on main level with bedroom/bathroom     Bathroom Shower/Tub: Tub/shower unit Shower/tub characteristics: Architectural technologist: Standard     Home Equipment: Tub bench   Additional Comments: will have friends staying with her at D/C      Prior Functioning/Environment Level of Independence: Independent        Comments: works as Emergency planning/management officer    OT Diagnosis: Acute pain   OT Problem List: Decreased knowledge of use of DME or AE;Decreased knowledge of precautions;Pain;Impaired balance (sitting and/or standing)   OT Treatment/Interventions: Self-care/ADL training;Therapeutic activities;DME and/or AE instruction;Patient/family education    OT Goals(Current goals can be found in the care plan section) Acute Rehab OT Goals Patient Stated Goal: Home & return to work when able OT Goal Formulation: With patient Time For Goal Achievement: 12/12/15 Potential to Achieve Goals: Good  OT Frequency: Min 2X/week   Barriers to D/C:            Co-evaluation              End of Session Equipment Utilized During Treatment: Gait belt;Rolling walker Nurse Communication: Mobility status  Activity Tolerance: Patient tolerated treatment well Patient left: in chair;with call bell/phone within reach;with nursing/sitter in room   Time: 1001-1047 OT Time Calculation (min): 46 min Charges:  OT General Charges $OT Visit: 1 Procedure OT Evaluation $OT Eval Low Complexity: 1 Procedure OT Treatments $Self Care/Home Management : 23-37 mins G-Codes:    Josephine Igo Dixon, OTR/L 11/28/2015, 10:57 AM

## 2015-11-28 NOTE — Discharge Instructions (Signed)
Information on my medicine - XARELTO® (Rivaroxaban) ° °This medication education was reviewed with me or my healthcare representative as part of my discharge preparation.  The pharmacist that spoke with me during my hospital stay was:  Annelies Coyt David, RPH ° °Why was Xarelto® prescribed for you? °Xarelto® was prescribed for you to reduce the risk of blood clots forming after orthopedic surgery. The medical term for these abnormal blood clots is venous thromboembolism (VTE). ° °What do you need to know about xarelto® ? °Take your Xarelto® ONCE DAILY at the same time every day. °You may take it either with or without food. ° °If you have difficulty swallowing the tablet whole, you may crush it and mix in applesauce just prior to taking your dose. ° °Take Xarelto® exactly as prescribed by your doctor and DO NOT stop taking Xarelto® without talking to the doctor who prescribed the medication.  Stopping without other VTE prevention medication to take the place of Xarelto® may increase your risk of developing a clot. ° °After discharge, you should have regular check-up appointments with your healthcare provider that is prescribing your Xarelto®.   ° °What do you do if you miss a dose? °If you miss a dose, take it as soon as you remember on the same day then continue your regularly scheduled once daily regimen the next day. Do not take two doses of Xarelto® on the same day.  ° °Important Safety Information °A possible side effect of Xarelto® is bleeding. You should call your healthcare provider right away if you experience any of the following: °? Bleeding from an injury or your nose that does not stop. °? Unusual colored urine (red or dark brown) or unusual colored stools (red or black). °? Unusual bruising for unknown reasons. °? A serious fall or if you hit your head (even if there is no bleeding). ° °Some medicines may interact with Xarelto® and might increase your risk of bleeding while on Xarelto®. To help avoid  this, consult your healthcare provider or pharmacist prior to using any new prescription or non-prescription medications, including herbals, vitamins, non-steroidal anti-inflammatory drugs (NSAIDs) and supplements. ° °This website has more information on Xarelto®: www.xarelto.com. ° ° ° °

## 2015-11-28 NOTE — Progress Notes (Signed)
Orthopedic Tech Progress Note Patient Details:  Katie Kaiser January 07, 1946 [id number]  CPM Right Knee CPM Right Knee: On Right Knee Flexion (Degrees): 50 Right Knee Extension (Degrees): 0 Additional Comments: Trapeze bar   [name] 11/28/2015, 6:51 PM

## 2015-11-28 NOTE — Progress Notes (Signed)
Physical Therapy Treatment Patient Details Name: Katie Kaiser MRN: [id number] DOB: [birth year] Today's Date: 11/28/2015    History of Present Illness 70 y.o. female s/p Rt TKA. PMH: depression    PT Comments    Pt able to progress to ambulation, 15 ft with rw and min guard assist. Pt fatiguing quickly with mobility. PT to continue to follow and progress mobility with anticipation of D/C to home following acute stay.   Follow Up Recommendations  Home health PT;Supervision for mobility/OOB     Equipment Recommendations  Rolling walker with 5" wheels    Recommendations for Other Services       Precautions / Restrictions Precautions Precautions: Knee;Fall Restrictions Weight Bearing Restrictions: Yes RLE Weight Bearing: Partial weight bearing RLE Partial Weight Bearing Percentage or Pounds: 50%    Mobility  Bed Mobility Overal bed mobility: Needs Assistance Bed Mobility: Supine to Sit     Supine to sit: Min assist     General bed mobility comments: assist provided with Rt LE, pt using rail and HOB elevated  Transfers Overall transfer level: Needs assistance Equipment used: Rolling walker (2 wheeled) Transfers: Sit to/from Stand Sit to Stand: Min assist         General transfer comment: cues for hand position  Ambulation/Gait Ambulation/Gait assistance: Min guard Ambulation Distance (Feet): 15 Feet Assistive device: Rolling walker (2 wheeled) Gait Pattern/deviations: Step-to pattern Gait velocity: decreased   General Gait Details: cues to maintain 50% weightbearing. Pt fatigued by end of ambulation and requiring seated rest.    Stairs            Wheelchair Mobility    Modified Rankin (Stroke Patients Only)       Balance Overall balance assessment: Needs assistance Sitting-balance support: No upper extremity supported Sitting balance-Leahy Scale: Good     Standing balance support: Bilateral upper extremity supported Standing  balance-Leahy Scale: Poor Standing balance comment: using rw                    Cognition Arousal/Alertness: Awake/alert Behavior During Therapy: WFL for tasks assessed/performed Overall Cognitive Status: Within Functional Limits for tasks assessed                      Exercises Total Joint Exercises Ankle Circles/Pumps: AROM;Both;10 reps Quad Sets: Strengthening;Right;10 reps Heel Slides: AAROM;Right;10 reps Straight Leg Raises: Strengthening;Right;10 reps (min assist) Goniometric ROM: approx. 80 degrees flexion    General Comments        Pertinent Vitals/Pain Pain Assessment: Faces Faces Pain Scale: Hurts little more Pain Location: Rt knee Pain Descriptors / Indicators: Grimacing Pain Intervention(s): Limited activity within patient's tolerance;Monitored during session;Ice applied    Home Living                      Prior Function            PT Goals (current goals can now be found in the care plan section) Acute Rehab PT Goals Patient Stated Goal: return home and be able to play with grandkids PT Goal Formulation: With patient Time For Goal Achievement: 12/11/15 Potential to Achieve Goals: Good Progress towards PT goals: Progressing toward goals    Frequency  7X/week    PT Plan Current plan remains appropriate    Co-evaluation             End of Session Equipment Utilized During Treatment: Gait belt Activity Tolerance: Patient tolerated treatment well Patient left: in chair;with  call bell/phone within reach (in knee extension)     Time: [id number] PT Time Calculation (min) (ACUTE ONLY): 33 min  Charges:  $Gait Training: 8-22 mins $Therapeutic Exercise: 8-22 mins                    G Codes:      Cassell Clement, PT, CSCS Pager [phone] Office [phone]  11/28/2015, 8:56 AM

## 2015-11-28 NOTE — Progress Notes (Signed)
Physical Therapy Treatment Patient Details Name: Katie Kaiser MRN: [id number] DOB: Feb 18, 1946 Today's Date: 11/28/2015    History of Present Illness 70 y.o. female s/p Rt TKA. PMH: depression    PT Comments    Pt progressing with ambulation, 50 ft with rw and min guard assistance. Pt becoming fatigued and taking 2 standing breaks. Will continue to follow and progress mobility in anticipation of D/C to home.   Follow Up Recommendations  Home health PT;Supervision for mobility/OOB     Equipment Recommendations  Rolling walker with 5" wheels    Recommendations for Other Services       Precautions / Restrictions Precautions Precautions: Knee;Fall Restrictions Weight Bearing Restrictions: Yes RLE Weight Bearing: Partial weight bearing RLE Partial Weight Bearing Percentage or Pounds: 50%    Mobility  Bed Mobility Overal bed mobility: Needs Assistance Bed Mobility: Supine to Sit     Supine to sit: Supervision     General bed mobility comments: using rail to assist  Transfers Overall transfer level: Needs assistance Equipment used: Rolling walker (2 wheeled) Transfers: Sit to/from Stand Sit to Stand: Min guard         General transfer comment: cues to fully turn before sitting.   Ambulation/Gait Ambulation/Gait assistance: Min guard Ambulation Distance (Feet): 50 Feet Assistive device: Rolling walker (2 wheeled) Gait Pattern/deviations: Step-to pattern Gait velocity: decreased   General Gait Details: maintaining 50% wb during ambulation, 2 standing breaks to let arms rest.    Stairs            Wheelchair Mobility    Modified Rankin (Stroke Patients Only)       Balance Overall balance assessment: Needs assistance Sitting-balance support: No upper extremity supported Sitting balance-Leahy Scale: Good     Standing balance support: Bilateral upper extremity supported Standing balance-Leahy Scale: Poor Standing balance comment: using rw for  support                    Cognition Arousal/Alertness: Awake/alert Behavior During Therapy: WFL for tasks assessed/performed Overall Cognitive Status: Within Functional Limits for tasks assessed                      Exercises Total Joint Exercises Ankle Circles/Pumps: AROM;Both;10 reps Quad Sets: Strengthening;Right;10 reps Heel Slides: AAROM;Right;10 reps    General Comments        Pertinent Vitals/Pain Pain Assessment: 0-10 Pain Score: 6  Pain Location: Rt knee Pain Descriptors / Indicators: Aching Pain Intervention(s): Limited activity within patient's tolerance;Monitored during session    Home Living                      Prior Function            PT Goals (current goals can now be found in the care plan section) Acute Rehab PT Goals Patient Stated Goal: play with grandkids PT Goal Formulation: With patient Time For Goal Achievement: 12/11/15 Potential to Achieve Goals: Good Progress towards PT goals: Progressing toward goals    Frequency  7X/week    PT Plan Current plan remains appropriate    Co-evaluation             End of Session Equipment Utilized During Treatment: Gait belt Activity Tolerance: Patient tolerated treatment well Patient left: in chair;with call bell/phone within reach (in knee extension)     Time: [id number] PT Time Calculation (min) (ACUTE ONLY): 26 min  Charges:  $Gait Training: 8-22 mins $Therapeutic Exercise: 8-22  mins                    G Codes:      Cassell Clement, PT, CSCS Pager [phone] Office [phone]  11/28/2015, 3:31 PM

## 2015-11-28 NOTE — Op Note (Signed)
Katie Kaiser, Katie Kaiser             ACCOUNT NO.:  [id number]  MEDICAL RECORD NO.:  [id number]  LOCATION:  [id number]                        FACILITY:  [facility]  PHYSICIAN:  Vonna Kotyk. Noam Franzen, M.D.DATE OF BIRTH:  12-Feb-1946  DATE OF PROCEDURE:  11/27/2015 DATE OF DISCHARGE:                              OPERATIVE REPORT   PREOPERATIVE DIAGNOSIS:  End-stage osteoarthritis, right knee.  POSTOPERATIVE DIAGNOSIS:  End-stage osteoarthritis, right knee.  PROCEDURE:  Right total knee replacement.  SURGEON:  Vonna Kotyk. Durward Fortes, M.D.  ASSISTANT:  Aaron Edelman D. Petrarca, PA-C.  ANESTHESIA:  Spinal with supplemental femoral nerve block.  COMPLICATIONS:  None.  COMPONENTS:  Two LCS standard plus femoral component #4 revision tibial tray with a 10-mm polyethylene bridging bearing, a metal-backed 3-PEG rotating patella.  Components were secured with polymethylmethacrylate.  DESCRIPTION OF PROCEDURE:  Katie Kaiser was met with her son in the holding area.  I identified the right knee as appropriate operative side and marked it accordingly.  The patient was then transported to room #7 and placed under spinal anesthesia without difficulty.  Nursing staff inserted a Foley catheter.  Urine was clear.  Specimen was obtained for culture and sensitivity.  Tourniquet was then applied to the right thigh.  The right leg was then prepped with chlorhexidine scrub and DuraPrep from the tourniquet to the tips of the toes.  Sterile draping was performed.  Time-out was called.  The extremity was then elevated, Esmarch exsanguinated with a proximal tourniquet at 350 mmHg.  A longitudinal incision was made from the superior pouch to the tibial tubercle about the right knee and via sharp dissection, carried down to subcutaneous tissue.  There was abundant adipose tissue that was incised through the layer of the first capsule.  Small bleeders were Bovie coagulated.  A medial parapatellar incision was made through the  deep capsule with the Bovie.  The joint was entered.  There was a clear yellow joint effusion.  The patella was everted 180 degrees laterally and the knee flexed to 90 degrees.  Inspection of knee revealed end-stage osteoarthritis.  We removed any cartilage remaining along the medial femoral condyle and tibial plateau. There was a moderate amount of beefy red inflammatory synovitis, this was resected.  I measured a standard plus femoral component.  First, bony cut was made transversely with a 3 degrees angle of declination on the tibia using the external tibial guide.  After each bony cut, I checked my alignment with the external guide.  Initial femoral cut was then made with the standard plus femoral jig.  Flexion and extension gaps were perfectly symmetrical at 10 mm.  The patient had a slight flexion contracture preoperatively, which was corrected intraoperatively.  A 4 degree distal femoral valgus cut was used.  The finishing guide was then applied to the femur to obtain the tapering cuts and the center hole.  The MCL and LCL remained intact throughout the procedure.  Lamina spreader was then placed within the knee joint for easier visualization of the medial lateral compartment.  Medial lateral menisci removed as well as ACL and PCL.  Osteophytes were removed from the posterior femoral condyle using a 3/4-inch curved osteotome.  Finishing jig was then  applied to the femur.  Retractor was then placed around the tibia, was advanced anteriorly and measured a #4 tibial tray.  Because of the patient's size, I elected to use the revision tibial tray with a longer central stem.  This was pinned in place.  Center hole was then made followed by the keeled cut. With the tibial jig in place, the 10-mm polyethylene bridging bearing was applied, followed by the trial standard plus femoral component. Components were reduced and through a full range of motion, the knee was perfectly  stable with the varus and valgus stress.  There was no malrotation of the rotating tibial tray.  The patella was then prepared by removing approximately 11 mm of bone, leaving 13.5 mm of patella thickness.  The 3 holed jig was then applied, holes made and the trial patella inserted through a full range of motion, remained perfectly stable.  The trial components were removed.  The joint was copiously irrigated with saline solution.  The final components were then impacted with polymethylmethacrylate.  We initially inserted the revision tibial tray followed by the 10-mm polyethylene bridging bearing and the standard plus femoral component. Components were impacted in place.  We then removed any extraneous methacrylate from the periphery of the components.  The patella was applied with the patella clamp and methacrylate.  At approximately 16 minutes, the methacrylate had matured during which time, we injected the joint with 0.25% Marcaine with epinephrine.  The tourniquet was deflated at approximately 66 minutes.  Many bleeders were Bovie coagulated.  Topical tranexamic acid was applied to the joint.  Medium-sized Hemovac was placed to the lateral compartment.  The deep capsule was closed with running #1 Ethibond, superficial capsule closed with several layers of Vicryl and subcu with 3-0 Monocryl, skin closed with skin clips.  Sterile bulky dressing was applied.  Anesthesia, we performed a femoral nerve block postoperatively.  The patient tolerated the procedure well without complications.     Vonna Kotyk. Durward Fortes, M.D.     PWW/MEDQ  D:  11/27/2015  T:  11/28/2015  Job:  [id number]

## 2015-11-29 ENCOUNTER — Encounter (HOSPITAL_COMMUNITY): Payer: Self-pay | Admitting: General Practice

## 2015-11-29 LAB — BASIC METABOLIC PANEL
ANION GAP: 5 (ref 5–15)
BUN: 14 mg/dL (ref 6–20)
CALCIUM: 8.3 mg/dL — AB (ref 8.9–10.3)
CO2: 27 mmol/L (ref 22–32)
CREATININE: 0.71 mg/dL (ref 0.44–1.00)
Chloride: 100 mmol/L — ABNORMAL LOW (ref 101–111)
GFR calc Af Amer: >60 mL/min (ref >60)
GLUCOSE: 119 mg/dL — AB (ref 65–99)
Potassium: 3.6 mmol/L (ref 3.5–5.1)
Sodium: 132 mmol/L — ABNORMAL LOW (ref 135–145)

## 2015-11-29 LAB — CBC
HCT: 25.7 % — ABNORMAL LOW (ref 36.0–46.0)
Hemoglobin: 8.4 g/dL — ABNORMAL LOW (ref 12.0–15.0)
MCH: 26 pg (ref 26.0–34.0)
MCHC: 32.7 g/dL (ref 30.0–36.0)
MCV: 79.6 fL (ref 78.0–100.0)
PLATELETS: 154 10*3/uL (ref 150–400)
RBC: 3.23 MIL/uL — AB (ref 3.87–5.11)
RDW: 15.3 % (ref 11.5–15.5)
WBC: 10 10*3/uL (ref 4.0–10.5)

## 2015-11-29 MED ORDER — OXYCODONE-ACETAMINOPHEN 5-325 MG PO TABS
1.0000 | ORAL_TABLET | ORAL | Status: DC | PRN
  Administered 2015-11-29 – 2015-11-30 (×7): 2 via ORAL
  Filled 2015-11-29 (×7): qty 2

## 2015-11-29 MED ORDER — RIVAROXABAN 10 MG PO TABS: 10.0000 mg | ORAL_TABLET | Freq: Every day | ORAL | 0 refills | Status: DC

## 2015-11-29 MED ORDER — OXYCODONE-ACETAMINOPHEN 5-325 MG PO TABS: 1.0000 | ORAL_TABLET | ORAL | Status: DC | PRN

## 2015-11-29 MED ORDER — OXYCODONE-ACETAMINOPHEN 5-325 MG PO TABS: 1.0000 | ORAL_TABLET | ORAL | 0 refills | Status: DC | PRN

## 2015-11-29 MED ORDER — METHOCARBAMOL 500 MG PO TABS: 500.0000 mg | ORAL_TABLET | Freq: Four times a day (QID) | ORAL | 0 refills | Status: DC | PRN

## 2015-11-29 NOTE — Progress Notes (Signed)
Patient ID: Katie Kaiser, female   DOB: 05-08-1945, 70 y.o.   MRN: [id number] PATIENT ID: Katie Kaiser        MRN:  [id number]          DOB/AGE: 09-Jul-1945 / 70 y.o.    Joni Fears, MD   Biagio Borg, PA-C [address] [place], [place]  [address]                             [phone]   PROGRESS NOTE  Subjective:  negative for Chest Pain  negative for Shortness of Breath  negative for Nausea/Vomiting   negative for Calf Pain    Tolerating Diet: yes         Patient reports pain as moderate.     "i am much better' some itching with dilaudid, OK with percocet  Objective: Vital signs in last 24 hours:   Patient Vitals for the past 24 hrs:  BP Temp Temp src Pulse Resp SpO2  11/29/15 1301 (!) 146/59 98.7 F (37.1 C) Oral 79 18 92 %  11/29/15 1005 127/61 - - 66 18 94 %  11/29/15 0459 (!) 145/64 98.1 F (36.7 C) Oral 71 - 92 %  11/28/15 2140 - 98.6 F (37 C) Oral - - -  11/28/15 2049 (!) 156/73 (!) 100.9 F (38.3 C) Oral 88 - 94 %      Intake/Output from previous day:   07/26 0701 - 07/27 0700 In: 960 [P.O.:960] Out: 225 [Drains:225]   Intake/Output this shift:   07/27 0701 - 07/27 1900 In: 450 [P.O.:450] Out: -    Intake/Output      07/26 0701 - 07/27 0700 07/27 0701 - 07/28 0700   P.O. 960 450   I.V. (mL/kg)     IV Piggyback     Total Intake(mL/kg) 960 (9) 450 (4.2)   Urine (mL/kg/hr) 0 (0)    Drains 225 (0.1)    Blood     Total Output 225     Net +735 +450        Urine Occurrence 7 x 1 x   Stool Occurrence  1 x      LABORATORY DATA:  Recent Labs  11/28/15 0646 11/29/15 0424  WBC 7.0 10.0  HGB 8.6* 8.4*  HCT 27.0* 25.7*  PLT 146* 154    Recent Labs  11/28/15 0646 11/29/15 0424  NA 135 132*  K 3.6 3.6  CL 99* 100*  CO2 28 27  BUN 11 14  CREATININE 0.68 0.71  GLUCOSE 108* 119*  CALCIUM 8.3* 8.3*   Lab Results  Component Value Date   INR 1.04 11/16/2015    Recent Radiographic Studies :  Dg Chest 2 View  Result  Date: 11/16/2015 CLINICAL DATA:  70 year old female for preoperative respiratory examination prior to right knee replacement. EXAM: CHEST  2 VIEW COMPARISON:  05/21/2015 and 09/05/2011 chest radiographs FINDINGS: Mild cardiomegaly and mild interstitial prominence again noted. There is no evidence of focal airspace disease, pulmonary edema, suspicious pulmonary nodule/mass, pleural effusion, or pneumothorax. No acute bony abnormalities are identified. IMPRESSION: Mild cardiomegaly without evidence of acute cardiopulmonary disease. Mild chronic interstitial prominence. Electronically Signed   By: Margarette Canada M.D.   On: 11/16/2015 15:39     Examination:  General appearance: alert, cooperative, no distress and moderately obese  Wound Exam: clean, dry, intact   Drainage:  Scant/small amount Serosanguinous exudate  Motor Exam: EHL, FHL, Anterior  Tibial and Posterior Tibial Intact  Sensory Exam: Superficial Peroneal, Deep Peroneal and Tibial normal  Vascular Exam: Normal  Assessment:    2 Days Post-Op  Procedure(s) (LRB): TOTAL KNEE ARTHROPLASTY (Right)  ADDITIONAL DIAGNOSIS:  Principal Problem:   Primary osteoarthritis of right knee Active Problems:   S/P total knee replacement using cement  Acute Blood Loss Anemia and Hyponatremia-stable   Plan: Physical Therapy as ordered Partial Weight Bearing @ 50% (PWB)  DVT Prophylaxis:  Xarelto, Foot Pumps and TED hose  DISCHARGE PLAN: Home  DISCHARGE NEEDS: HHPT, CPM, Walker and 3-in-1 comode seat Needs one more day for PT, voiding without difficulty, no nausea, dressing changed-wound clean, plan for D/C tomorrow       Joni Fears W  11/29/2015 2:40 PM

## 2015-11-29 NOTE — Progress Notes (Signed)
Orthopedic Tech Progress Note Patient Details:  Katie Kaiser Sep 20, 1945 [id number]  CPM Right Knee CPM Right Knee: On Right Knee Flexion (Degrees): 50 Right Knee Extension (Degrees): 0 Additional Comments: Trapeze bar   [name] 11/29/2015, 7:00 PM

## 2015-11-29 NOTE — Discharge Summary (Signed)
Joni Fears, MD   Biagio Borg, PA-C [address] [place], [place]  [address]                             [phone]  PATIENT ID: Katie Kaiser        MRN:  [id number]          DOB/AGE: August 07, 1945 / 70 y.o.    DISCHARGE SUMMARY  ADMISSION DATE:    11/27/2015 DISCHARGE DATE:   11/30/2015   ADMISSION DIAGNOSIS: RIGHT KNEE OSTEOARTHRITIS    DISCHARGE DIAGNOSIS:  RIGHT KNEE OSTEOARTHRITIS    ADDITIONAL DIAGNOSIS: Principal Problem:   Primary osteoarthritis of right knee Active Problems:   S/P total knee replacement using cement  Past Medical History:  Diagnosis Date  . Arthritis   . Depression   . Diverticulosis   . Essential hypertension   . GERD (gastroesophageal reflux disease)   . Heart murmur    slight murmur per pt  . Hypothyroid   . Impaired fasting glucose 5/07  . Kidney stone   . OAB (overactive bladder)   . Obesity   . Pneumonia   . Vitamin D deficiency     PROCEDURE: Procedure(s): TOTAL KNEE ARTHROPLASTY Right on 11/27/2015  CONSULTS: none    HISTORY: Katie Kaiser is a very pleasant 70 year old white female who has had chronic problems with her right knee. We first saw her back in 2014 when she was having problems with the right knee for about 2 to 3 years. She had been tried with anti-inflammatories and actually had cortisone injections to her right knee by Dr. Redmond School at that time. They have had some benefit at times. We have seen her for this and have given her not only corticosteroid injections but also Euflexxa injections multiply. She had just finished her last Euflexxa injection in April of this year. Since that time she has had increasing pain and discomfort in the knee requiring some intermittent use of narcotics for her pain. She now has pain with every step, also nighttime pain. 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 She does have a limp.  She has tried corticosteroid injections, Euflexxa, and conservative exercises and treatment and basically has now failed to the point where her pain is much worse, and she is unable to control.  HOSPITAL COURSE:  HAWANYA HECKSEL is a 70 y.o. admitted on 11/27/2015 and found to have a diagnosis of RIGHT KNEE OSTEOARTHRITIS.  After appropriate laboratory studies were obtained  they were taken to the operating room on 11/27/2015 and underwent  Procedure(s): TOTAL KNEE ARTHROPLASTY  Right.   They were given perioperative antibiotics:  Anti-infectives    Start     Dose/Rate Route Frequency Ordered Stop   11/27/15 1330  ceFAZolin (ANCEF) IVPB 2g/100 mL premix     2 g 200 mL/hr over 30 Minutes Intravenous Every 6 hours 11/27/15 1133 11/27/15 1816   11/27/15 0600  ceFAZolin (ANCEF) IVPB 2g/100 mL premix     2 g 200 mL/hr over 30 Minutes Intravenous To Short Stay 11/26/15 1032 11/27/15 0730    .  Tolerated the procedure well.  Placed with a foley intraoperatively.    Toradol was given post op.  POD #1, allowed out of bed to a chair.  PT for ambulation and exercise program.  Foley D/C'd in morning.  IV saline locked.  O2 discontionued.  POD #2, continued PT and ambulation.   Hemovac pulled.  Dressing changed. Marland Kitchen POD #3, continued PT.  The remainder of the hospital course was dedicated to ambulation and strengthening.   The patient was discharged on 3 Days Post-Op in  Stable condition.  Blood products given:none  DIAGNOSTIC STUDIES: Recent vital signs:  Patient Vitals for the past 24 hrs:  BP Temp Temp src Pulse Resp SpO2  11/30/15 0538 128/62 98.5 F (36.9 C) Oral 82 16 97 %  11/29/15 1301 (!) 146/59 98.7 F (37.1 C) Oral 79 18 92 %  11/29/15 1005 127/61 - - 66 18 94 %       Recent laboratory studies:  Recent Labs  11/28/15 0646 11/29/15 0424 11/30/15 0422  WBC 7.0 10.0 8.5  HGB 8.6* 8.4* 8.2*  HCT 27.0* 25.7* 24.6*  PLT 146* 154 152    Recent Labs  11/28/15 0646  11/29/15 0424 11/30/15 0422  NA 135 132* 136  K 3.6 3.6 3.7  CL 99* 100* 101  CO2 28 27 28   BUN 11 14 13   CREATININE 0.68 0.71 0.55  GLUCOSE 108* 119* 113*  CALCIUM 8.3* 8.3* 8.3*   Lab Results  Component Value Date   INR 1.04 11/16/2015     Recent Radiographic Studies :  Dg Chest 2 View  Result Date: 11/16/2015 CLINICAL DATA:  70 year old female for preoperative respiratory examination prior to right knee replacement. EXAM: CHEST  2 VIEW COMPARISON:  05/21/2015 and 09/05/2011 chest radiographs FINDINGS: Mild cardiomegaly and mild interstitial prominence again noted. There is no evidence of focal airspace disease, pulmonary edema, suspicious pulmonary nodule/mass, pleural effusion, or pneumothorax. No acute bony abnormalities are identified. IMPRESSION: Mild cardiomegaly without evidence of acute cardiopulmonary disease. Mild chronic interstitial prominence. Electronically Signed   By: Margarette Canada M.D.   On: 11/16/2015 15:39    DISCHARGE INSTRUCTIONS: Discharge Instructions    CPM    Complete by:  As directed   Continuous passive motion machine (CPM):      Use the CPM from 0 to 60 for 6-8 hours per day.      You may increase by 5-10 degrees per day.  You may break it up into 2 or 3 sessions per day.      Use CPM for 3-4  weeks or until you are told to stop.   Call MD / Call 911    Complete by:  As directed   If you experience chest pain or shortness of breath, CALL 911 and be transported to the hospital emergency room.  If you develope a fever above 101 F, pus (white drainage) or increased drainage or redness at the wound, or calf pain, call your surgeon's office.   Change dressing    Complete by:  As directed   DO NOT CHANGE YOUR DRESSING   Constipation Prevention    Complete by:  As directed   Drink plenty of fluids.  Prune juice may be helpful.  You may use a stool softener, such as Colace (over the counter) 100 mg twice a day.  Use MiraLax (over the counter) for constipation as  needed.   Diet general    Complete by:  As directed   Discharge instructions    Complete by:  As directed   Re[place]olds items at home which could result in a fall. This includes throw rugs or furniture in walking pathways ICE to the affected joint  every three hours while awake for 30 minutes at a time, for at least the first 3-5 days, and then as needed for pain and swelling.  Continue to use ice for pain and swelling. You may notice swelling that will progress down to the foot and ankle.  This is normal after surgery.  Elevate your leg when you are not up walking on it.   Continue to use the breathing machine you got in the hospital (incentive spirometer) which will help keep your temperature down.  It is common for your temperature to cycle up and down following surgery, especially at night when you are not up moving around and exerting yourself.  The breathing machine keeps your lungs expanded and your temperature down.   DIET:  As you were doing prior to hospitalization, we recommend a well-balanced diet.  DRESSING / WOUND CARE / SHOWERING  Keep the surgical dressing until follow up.  The dressing is water proof, so you can shower without any extra covering.  IF THE DRESSING FALLS OFF or the wound gets wet inside, change the dressing with sterile gauze.  Please use good hand washing techniques before changing the dressing.  Do not use any lotions or creams on the incision until instructed by your surgeon.    ACTIVITY  Increase activity slowly as tolerated, but follow the weight bearing instructions below.   No driving for 6 weeks or until further direction given by your physician.  You cannot drive while taking narcotics.  No lifting or carrying greater than 10 lbs. until further directed by your surgeon. Avoid periods of inactivity such as sitting longer than an hour when not asleep. This helps prevent blood clots.  You may return to work once you are  authorized by your doctor.     WEIGHT BEARING   Partial weight bearing with assist device as directed.  50% weight as taught in PT   EXERCISES  Results after joint replacement surgery are often greatly improved when you follow the exercise, range of motion and muscle strengthening exercises prescribed by your doctor. Safety measures are also important to protect the joint from further injury. Any time any of these exercises cause you to have increased pain or swelling, decrease what you are doing until you are comfortable again and then slowly increase them. If you have problems or questions, call your caregiver or physical therapist for advice.   Rehabilitation is important following a joint replacement. After just a few days of immobilization, the muscles of the leg can become weakened and shrink (atrophy).  These exercises are designed to build up the tone and strength of the thigh and leg muscles and to improve motion. Often times heat used for twenty to thirty minutes before working out will loosen up your tissues and help with improving the range of motion but do not use heat for the first two weeks following surgery (sometimes heat can increase post-operative swelling).   These exercises can be done on a training (exercise) mat, on the floor, on a table or on a bed. Use whatever works the best and is most comfortable for you.    Use music or television while you are exercising so that the exercises are a pleasant break in your day. This will make your life better with the exercises acting as a break in your routine that you can look forward to.   Perform all exercises about fifteen times, three times per day or as directed.  You should exercise both  the operative leg and the other leg as well.   Exercises include:   Quad Sets - Tighten up the muscle on the front of the thigh (Quad) and hold for 5-10 seconds.   Straight Leg Raises - With your knee straight (if you were given a brace, keep  it on), lift the leg to 60 degrees, hold for 3 seconds, and slowly lower the leg.  Perform this exercise against resistance later as your leg gets stronger.  Leg Slides: Lying on your back, slowly slide your foot toward your buttocks, bending your knee up off the floor (only go as far as is comfortable). Then slowly slide your foot back down until your leg is flat on the floor again.  Angel Wings: Lying on your back spread your legs to the side as far apart as you can without causing discomfort.  Hamstring Strength:  Lying on your back, push your heel against the floor with your leg straight by tightening up the muscles of your buttocks.  Repeat, but this time bend your knee to a comfortable angle, and push your heel against the floor.  You may put a pillow under the heel to make it more comfortable if necessary.   A rehabilitation program following joint replacement surgery can speed recovery and prevent re-injury in the future due to weakened muscles. Contact your doctor or a physical therapist for more information on knee rehabilitation.    CONSTIPATION  Constipation is defined medically as fewer than three stools per week and severe constipation as less than one stool per week.  Even if you have a regular bowel pattern at home, your normal regimen is likely to be disrupted due to multiple reasons following surgery.  Combination of anesthesia, postoperative narcotics, change in appetite and fluid intake all can affect your bowels.   YOU MUST use at least one of the following options; they are listed in order of increasing strength to get the job done.  They are all available over the counter, and you may need to use some, POSSIBLY even all of these options:    Drink plenty of fluids (prune juice may be helpful) and high fiber foods Colace 100 mg by mouth twice a day  Senokot for constipation as directed and as needed Dulcolax (bisacodyl), take with full glass of water  Miralax (polyethylene  glycol) once or twice a day as needed.  If you have tried all these things and are unable to have a bowel movement in the first 3-4 days after surgery call either your surgeon or your primary doctor.    If you experience loose stools or diarrhea, hold the medications until you stool forms back up.  If your symptoms do not get better within 1 week or if they get worse, check with your doctor.  If you experience "the worst abdominal pain ever" or develop nausea or vomiting, please contact the office immediately for further recommendations for treatment.   ITCHING:  If you experience itching with your medications, try taking only a single pain pill, or even half a pain pill at a time.  You can also use Benadryl over the counter for itching or also to help with sleep.   TED HOSE STOCKINGS:  Use stockings on both legs until for at least 2 weeks or as directed by physician office. They may be removed at night for sleeping.  MEDICATIONS:  See your medication summary on the "After Visit Summary" that nursing will review with you.  You may  have some home medications which will be placed on hold until you complete the course of blood thinner medication.  It is important for you to complete the blood thinner medication as prescribed.  PRECAUTIONS:  If you experience chest pain or shortness of breath - call 911 immediately for transfer to the hospital emergency department.   If you develop a fever greater that 101 F, purulent drainage from wound, increased redness or drainage from wound, foul odor from the wound/dressing, or calf pain - CONTACT YOUR SURGEON.                                                   FOLLOW-UP APPOINTMENTS:  If you do not already have a post-op appointment, please call the office for an appointment to be seen by your surgeon.  Guidelines for how soon to be seen are listed in your "After Visit Summary", but are typically between 1-4 weeks after surgery.  OTHER INSTRUCTIONS:   Knee  Replacement:  Do not place pillow under knee, focus on keeping the knee straight while resting. CPM instructions: 0-90 degrees, 2 hours in the morning, 2 hours in the afternoon, and 2 hours in the evening. Place foam block, curve side up under heel at all times except when in CPM or when walking.  DO NOT modify, tear, cut, or change the foam block in any way.  MAKE SURE YOU:  Understand these instructions.  Get help right away if you are not doing well or get worse.    Thank you for letting us be a part of your medical care team.  It is a privilege we respect greatly.  We hope these instructions will help you stay on track for a fast and full recovery!   Do not put a pillow under the knee. Place it under the heel.    Complete by:  As directed   Driving restrictions    Complete by:  As directed   No driving for 6 weeks   Increase activity slowly as tolerated    Complete by:  As directed   Lifting restrictions    Complete by:  As directed   No lifting for 6 weeks   Partial weight bearing    Complete by:  As directed   % Body Weight:  50%   Laterality:  right   Extremity:  Lower   Patient may shower    Complete by:  As directed   You may shower over the brown dressing   TED hose    Complete by:  As directed   Use stockings (TED hose) for 2-3 weeks on right leg.  You may remove them at night for sleeping.      DISCHARGE MEDICATIONS:     Medication List    STOP taking these medications   aspirin 81 MG tablet   fish oil-omega-3 fatty acids 1000 MG capsule   HYDROcodone-acetaminophen 5-325 MG tablet Commonly known as:  NORCO/VICODIN     TAKE these medications   buPROPion 300 MG 24 hr tablet Commonly known as:  WELLBUTRIN XL Take 1 tablet (300 mg total) by mouth daily.   esomeprazole 20 MG capsule Commonly known as:  NEXIUM Take 20 mg by mouth daily at 12 noon.   FLUoxetine 40 MG capsule Commonly known as:  PROZAC Take 1 capsule (40 mg total) by  mouth daily.    hydrochlorothiazide 25 MG tablet Commonly known as:  HYDRODIURIL Take 1 tablet (25 mg total) by mouth daily.   methocarbamol 500 MG tablet Commonly known as:  ROBAXIN Take 1 tablet (500 mg total) by mouth every 6 (six) hours as needed for muscle spasms.   oxybutynin 10 MG 24 hr tablet Commonly known as:  DITROPAN XL Take 1 tablet (10 mg total) by mouth at bedtime.   oxyCODONE-acetaminophen 5-325 MG tablet Commonly known as:  PERCOCET/ROXICET Take 1-2 tablets by mouth every 4 (four) hours as needed for moderate pain.   rivaroxaban 10 MG Tabs tablet Commonly known as:  XARELTO Take 1 tablet (10 mg total) by mouth daily with breakfast.   SYNTHROID 200 MCG tablet Generic drug:  levothyroxine TAKE 1 TABLET (200 MCG TOTAL) BY MOUTH DAILY AS DIRECTED What changed:  how much to take  how to take this  when to take this  additional instructions   Vitamin D 1000 units capsule Take 1,000 Units by mouth daily.       FOLLOW UP VISIT:   Follow-up Information    WHITFIELD, Vonna Kotyk, MD. Schedule an appointment as soon as possible for a visit on 12/10/2015.   Specialty:  Orthopedic Surgery Contact information: [address] [place] [address] [phone]           DISPOSITION:   Home  CONDITION:  Stable   Mike Craze. [place], [place] [phone]  11/30/2015 8:41 AM

## 2015-11-29 NOTE — Progress Notes (Signed)
Physical Therapy Treatment Patient Details Name: Katie Kaiser MRN: [id number] DOB: [birth year] Today's Date: 11/29/2015    History of Present Illness 70 y.o. female s/p Rt TKA. PMH: depression    PT Comments    Pt able to increase her ambulation distance to 110 ft with rw. Increasing difficulty maintaining 50% WB with fatigue. Overall, pt improving and anticipate D/C home 11/30/15.   Follow Up Recommendations  Home health PT;Supervision for mobility/OOB     Equipment Recommendations  Rolling walker with 5" wheels    Recommendations for Other Services       Precautions / Restrictions Precautions Precautions: Knee;Fall Restrictions Weight Bearing Restrictions: Yes RLE Weight Bearing: Partial weight bearing RLE Partial Weight Bearing Percentage or Pounds: 50%    Mobility  Bed Mobility Overal bed mobility: Needs Assistance Bed Mobility: Sit to Supine       Sit to supine: Min assist   General bed mobility comments: assist needed with Rt LE, pt hooking with Lt LE to assist.   Transfers Overall transfer level: Needs assistance Equipment used: Rolling walker (2 wheeled) Transfers: Sit to/from Stand Sit to Stand: Min guard         General transfer comment: good technique, min guard for safety  Ambulation/Gait Ambulation/Gait assistance: Min guard;Supervision Ambulation Distance (Feet): 110 Feet Assistive device: Rolling walker (2 wheeled) Gait Pattern/deviations: Step-to pattern Gait velocity: decreased   General Gait Details: with fatigue pt with increasing difficulty with maintaining 50% WB.    Stairs            Wheelchair Mobility    Modified Rankin (Stroke Patients Only)       Balance Overall balance assessment: Needs assistance Sitting-balance support: No upper extremity supported Sitting balance-Leahy Scale: Good     Standing balance support: Bilateral upper extremity supported Standing balance-Leahy Scale: Poor Standing balance  comment: using rw                    Cognition Arousal/Alertness: Awake/alert Behavior During Therapy: WFL for tasks assessed/performed Overall Cognitive Status: Within Functional Limits for tasks assessed                      Exercises Total Joint Exercises Ankle Circles/Pumps: AROM;Both;10 reps Quad Sets: Strengthening;Right;10 reps Short Arc Quad: Strengthening;Right;10 reps (min assist)    General Comments        Pertinent Vitals/Pain Pain Assessment: 0-10 Pain Score: 4  Pain Location: Rt knee Pain Descriptors / Indicators: Sore Pain Intervention(s): Limited activity within patient's tolerance;Monitored during session;Ice applied    Home Living Family/patient expects to be discharged to:: Private residence Living Arrangements: Alone                  Prior Function            PT Goals (current goals can now be found in the care plan section) Acute Rehab PT Goals Patient Stated Goal: go home from the hospital PT Goal Formulation: With patient Time For Goal Achievement: 12/11/15 Potential to Achieve Goals: Good Progress towards PT goals: Progressing toward goals    Frequency  7X/week    PT Plan Current plan remains appropriate    Co-evaluation             End of Session Equipment Utilized During Treatment: Gait belt Activity Tolerance: Patient tolerated treatment well Patient left: in bed;with call bell/phone within reach;in CPM;with SCD's reapplied     Time: 1420-1453 PT Time Calculation (min) (ACUTE ONLY):  33 min  Charges:  $Gait Training: 8-22 mins $Therapeutic Exercise: 8-22 mins                    G Codes:      Cassell Clement, PT, CSCS Pager [phone] Office [phone]  11/29/2015, 3:35 PM

## 2015-11-29 NOTE — Progress Notes (Signed)
Orthopedic Tech Progress Note Patient Details:  Katie Kaiser August 06, 1945 [id number]  Patient ID: Katie Kaiser, female   DOB: 10-26-1945, 70 y.o.   MRN: [id number] Applied cpm 0-50. Up from 0-45. This is all the pt could handle.  Karolee Stamps 11/29/2015, 5:54 AM

## 2015-11-29 NOTE — Progress Notes (Signed)
Occupational Therapy Treatment Patient Details Name: Katie Kaiser MRN: [id number] DOB: 04-18-1946 Today's Date: 11/29/2015    History of present illness 70 y.o. female s/p Rt TKA. PMH: depression   OT comments  Focus of session on toileting and instruction in use of AE for LB bathing and dressing. Pt with difficulty keeping her eyes open when seated in chair, likely to need repetition in AE use. Deferred tub transfer this visit, but verbally instructed. Pt insists her friends will be able to assist her with personal care as needed at home.  Follow Up Recommendations  Home health OT;Supervision - Intermittent    Equipment Recommendations  None recommended by OT    Recommendations for Other Services      Precautions / Restrictions Precautions Precautions: Knee;Fall Restrictions Weight Bearing Restrictions: Yes RLE Weight Bearing: Partial weight bearing RLE Partial Weight Bearing Percentage or Pounds: 50%       Mobility Bed Mobility               General bed mobility comments: pt in chair upon arrival  Transfers Overall transfer level: Needs assistance Equipment used: Rolling walker (2 wheeled) Transfers: Sit to/from Stand Sit to Stand: Min guard         General transfer comment: good technique, min guard for safety    Balance Overall balance assessment: Needs assistance Sitting-balance support: No upper extremity supported Sitting balance-Leahy Scale: Good     Standing balance support: Bilateral upper extremity supported Standing balance-Leahy Scale: Poor Standing balance comment: using rw for support                   ADL Overall ADL's : Needs assistance/impaired     Grooming: Wash/dry hands;Sitting;Set up         Lower Body Bathing Details (indicate cue type and reason): educated in use of long handled bath sponge       Lower Body Dressing Details (indicate cue type and reason): educated in use of sock aid, reacher and long shoe  horn Toilet Transfer: RW;Ambulation;BSC;Min guard (over toilet)   Toileting- Clothing Manipulation and Hygiene: Minimal assistance;Sit to/from stand       Functional mobility during ADLs: Rolling walker;Cueing for sequencing;Min guard General ADL Comments: Instructed in technique for tub transfer maintaining PWB precautions. Pt to have her friend purchase AE kit in gift shop.      Vision                     Perception     Praxis      Cognition   Behavior During Therapy: [facility] for tasks assessed/performed Overall Cognitive Status: Within Functional Limits for tasks assessed                       Extremity/Trunk Assessment               Exercises Total Joint Exercises Ankle Circles/Pumps: AROM;Both;10 reps Quad Sets: Strengthening;Right;10 reps Short Arc Quad: Strengthening;Right;10 reps (min assist) Heel Slides: AAROM;Right;10 reps Straight Leg Raises: Strengthening;Right;10 reps (mod-min assist) Goniometric ROM: 69 degrees flexion   Shoulder Instructions       General Comments      Pertinent Vitals/ Pain       Pain Assessment: Faces Faces Pain Scale: Hurts little more Pain Location: R knee with mobility Pain Descriptors / Indicators: Grimacing;Guarding Pain Intervention(s): Premedicated before session  Home Living  Prior Functioning/Environment              Frequency Min 3X/week     Progress Toward Goals  OT Goals(current goals can now be found in the care plan section)  Progress towards OT goals: Progressing toward goals  Acute Rehab OT Goals Patient Stated Goal: go home from the hospital Time For Goal Achievement: 12/12/15 Potential to Achieve Goals: Good  Plan Discharge plan remains appropriate;Frequency needs to be updated    Co-evaluation                 End of Session Equipment Utilized During Treatment: Gait belt;Rolling walker   Activity Tolerance  Patient limited by fatigue   Patient Left in chair;with call bell/phone within reach;with nursing/sitter in room   Nurse Communication          Time: 3338-3291 OT Time Calculation (min): 24 min  Charges: OT General Charges $OT Visit: 1 Procedure OT Treatments $Self Care/Home Management : 23-37 mins  Malka So 11/29/2015, 11:04 AM  [phone]

## 2015-11-29 NOTE — Progress Notes (Signed)
Physical Therapy Treatment Patient Details Name: Katie Kaiser MRN: [id number] DOB: 12-24-1945 Today's Date: 11/29/2015    History of Present Illness 70 y.o. female s/p Rt TKA. PMH: depression    PT Comments    Pt making gradual progress with PT regarding mobility. Pt able to ambulate 70 ft with rw, taking 3 standing breaks. Pt maintaining 50% weightbearing during ambulation but continues to have difficulty advancing LLE with swing phase. Will continue with PT and progress as tolerated in anticipation of D/C to home. Pt adamant that she will go home following her hospital stay.   Follow Up Recommendations  Home health PT;Supervision for mobility/OOB     Equipment Recommendations  Rolling walker with 5" wheels    Recommendations for Other Services       Precautions / Restrictions Precautions Precautions: Knee;Fall Restrictions Weight Bearing Restrictions: Yes RLE Weight Bearing: Partial weight bearing RLE Partial Weight Bearing Percentage or Pounds: 50%    Mobility  Bed Mobility               General bed mobility comments: pt in chair upon arrival  Transfers Overall transfer level: Needs assistance Equipment used: Rolling walker (2 wheeled) Transfers: Sit to/from Stand Sit to Stand: Min guard         General transfer comment: no cues needed for hand position, encouraging knee flexion on rt with sitting.   Ambulation/Gait Ambulation/Gait assistance: Min guard Ambulation Distance (Feet): 70 Feet   Gait Pattern/deviations: Step-to pattern Gait velocity: decreased   General Gait Details: maintaining 50% wb during ambulation, 3 standing breaks to let arms rest, difficulty unweighting LLE for swinging LE forward.     Stairs         General stair comments: Pt states that she is not planning to go up the 3 steps to her bedroom when she initially goes home.   Wheelchair Mobility    Modified Rankin (Stroke Patients Only)       Balance Overall  balance assessment: Needs assistance Sitting-balance support: No upper extremity supported Sitting balance-Leahy Scale: Good     Standing balance support: Bilateral upper extremity supported Standing balance-Leahy Scale: Poor Standing balance comment: using rw for support                    Cognition Arousal/Alertness: Awake/alert Behavior During Therapy: WFL for tasks assessed/performed Overall Cognitive Status: Within Functional Limits for tasks assessed                      Exercises Total Joint Exercises Ankle Circles/Pumps: AROM;Both;10 reps Quad Sets: Strengthening;Right;10 reps Short Arc Quad: Strengthening;Right;10 reps (min assist) Heel Slides: AAROM;Right;10 reps Straight Leg Raises: Strengthening;Right;10 reps (mod-min assist) Goniometric ROM: 69 degrees flexion    General Comments        Pertinent Vitals/Pain Pain Assessment: Faces Faces Pain Scale: Hurts even more Pain Location: Rt knee Pain Descriptors / Indicators: Grimacing;Guarding Pain Intervention(s): Limited activity within patient's tolerance;Monitored during session;Premedicated before session;Ice applied    Home Living                      Prior Function            PT Goals (current goals can now be found in the care plan section) Acute Rehab PT Goals Patient Stated Goal: go home from the hospital PT Goal Formulation: With patient Time For Goal Achievement: 12/11/15 Potential to Achieve Goals: Good Progress towards PT goals: Progressing toward goals  Frequency  7X/week    PT Plan Current plan remains appropriate    Co-evaluation             End of Session Equipment Utilized During Treatment: Gait belt Activity Tolerance: Patient tolerated treatment well Patient left: in chair;with call bell/phone within reach (in knee extension)     Time: 0827-0902 PT Time Calculation (min) (ACUTE ONLY): 35 min  Charges:  $Gait Training: 8-22 mins $Therapeutic  Exercise: 8-22 mins                    G Codes:      Cassell Clement, PT, CSCS Pager [phone] Office [phone]  11/29/2015, 9:10 AM

## 2015-11-30 LAB — CBC
HEMATOCRIT: 24.6 % — AB (ref 36.0–46.0)
HEMOGLOBIN: 8.2 g/dL — AB (ref 12.0–15.0)
MCH: 26.3 pg (ref 26.0–34.0)
MCHC: 33.3 g/dL (ref 30.0–36.0)
MCV: 78.8 fL (ref 78.0–100.0)
Platelets: 152 10*3/uL (ref 150–400)
RBC: 3.12 MIL/uL — AB (ref 3.87–5.11)
RDW: 15.3 % (ref 11.5–15.5)
WBC: 8.5 10*3/uL (ref 4.0–10.5)

## 2015-11-30 LAB — BASIC METABOLIC PANEL
Anion gap: 7 (ref 5–15)
BUN: 13 mg/dL (ref 6–20)
CHLORIDE: 101 mmol/L (ref 101–111)
CO2: 28 mmol/L (ref 22–32)
Calcium: 8.3 mg/dL — ABNORMAL LOW (ref 8.9–10.3)
Creatinine, Ser: 0.55 mg/dL (ref 0.44–1.00)
GFR calc Af Amer: >60 mL/min (ref >60)
GFR calc non Af Amer: >60 mL/min (ref >60)
Glucose, Bld: 113 mg/dL — ABNORMAL HIGH (ref 65–99)
POTASSIUM: 3.7 mmol/L (ref 3.5–5.1)
SODIUM: 136 mmol/L (ref 135–145)

## 2015-11-30 NOTE — Progress Notes (Signed)
Physical Therapy Treatment Patient Details Name: Katie Kaiser MRN: [id number] DOB: November 30, 1945 Today's Date: 11/30/2015    History of Present Illness 70 y.o. female s/p Rt TKA. PMH: depression    PT Comments    Pt performed increased mobility and reviewed curb negotiation to enter home safely.  Pt reports she has friend staying with her who can assist with mobility.  Pt reports she will not negotiate stairs in home and only negotiate threshold to enter home.  Pt ready to d/c from mobility standpoint with assistance at home.     Follow Up Recommendations  Home health PT;Supervision for mobility/OOB     Equipment Recommendations  Rolling walker with 5" wheels    Recommendations for Other Services       Precautions / Restrictions Precautions Precautions: Knee;Fall Precaution Booklet Issued: Yes (comment) Precaution Comments: HEP provided, reviewed knee extension precautions Restrictions Weight Bearing Restrictions: Yes RLE Weight Bearing: Partial weight bearing RLE Partial Weight Bearing Percentage or Pounds: 50%    Mobility  Bed Mobility               General bed mobility comments: Pt received in recliner on arrival.    Transfers Overall transfer level: Needs assistance Equipment used: Rolling walker (2 wheeled) Transfers: Sit to/from Stand Sit to Stand: Min guard Stand pivot transfers: Min guard       General transfer comment: Pt uses rocking motion with increased time to stand required min guard for safety.    Ambulation/Gait Ambulation/Gait assistance: Supervision Ambulation Distance (Feet): 120 Feet Assistive device: Rolling walker (2 wheeled) Gait Pattern/deviations: Step-to pattern;Trunk flexed;Antalgic Gait velocity: decreased   General Gait Details: with fatigue pt with increasing difficulty with maintaining 50% WB. PTA emphasized UE use to improve compliance with PWB status.     Stairs Stairs: Yes Stairs assistance: Min assist Stair  Management: No rails;Step to pattern;Backwards;Forwards;With walker Number of Stairs: 1 General stair comments: Cues for sequencing and RW placement.  Pt required cues for body position and use of RW.  Pt demonstrates backwards to ascend and forward to descend.  Pt required min assist descneding due to minor LOB.  Pt educated to perform only with assistance.    Wheelchair Mobility    Modified Rankin (Stroke Patients Only)       Balance                                    Cognition Arousal/Alertness: Awake/alert Behavior During Therapy: WFL for tasks assessed/performed Overall Cognitive Status: Within Functional Limits for tasks assessed                      Exercises Total Joint Exercises Ankle Circles/Pumps: AROM;Both;10 reps;Supine Quad Sets: AROM;Right;Supine Short Arc Quad: AROM;Right;10 reps;Supine Heel Slides: AROM;Right;10 reps;Supine Hip ABduction/ADduction: AAROM;Right;10 reps;Supine Straight Leg Raises: Strengthening;Right;10 reps;Supine Goniometric ROM: 6-62 degrees R Knee AROM.     General Comments        Pertinent Vitals/Pain Pain Assessment: 0-10 Pain Score: 8  Pain Location: R knee Pain Intervention(s): Patient requesting pain meds-RN notified;RN gave pain meds during session;Relaxation;Repositioned    Home Living                      Prior Function            PT Goals (current goals can now be found in the care plan section) Acute Rehab PT Goals  Patient Stated Goal: go home from the hospital Potential to Achieve Goals: Good Progress towards PT goals: Progressing toward goals    Frequency  7X/week    PT Plan Current plan remains appropriate    Co-evaluation             End of Session Equipment Utilized During Treatment: Gait belt Activity Tolerance: Patient tolerated treatment well Patient left: in chair;with call bell/phone within reach     Time: 0952-1020 PT Time Calculation (min) (ACUTE ONLY): 28  min  Charges:  $Gait Training: 8-22 mins $Therapeutic Exercise: 8-22 mins                    G Codes:      Cristela Blue 12/07/15, 10:27 AM  Governor Rooks, PTA pager [phone]

## 2015-11-30 NOTE — Care Management Important Message (Signed)
Important Message  Patient Details  Name: Katie Kaiser MRN: [id number] Date of Birth: 25-Dec-1945   Medicare Important Message Given:  Yes    Loann Quill 11/30/2015, 10:43 AM

## 2015-11-30 NOTE — Progress Notes (Signed)
Subjective: 3 Days Post-Op Procedure(s) (LRB): TOTAL KNEE ARTHROPLASTY (Right) Patient reports pain as mild.    Objective: Vital signs in last 24 hours: Temp:  [98.5 F (36.9 C)-98.7 F (37.1 C)] 98.5 F (36.9 C) (07/28 0538) Pulse Rate:  [66-82] 82 (07/28 0538) Resp:  [16-18] 16 (07/28 0538) BP: (127-146)/(59-62) 128/62 (07/28 0538) SpO2:  [92 %-97 %] 97 % (07/28 0538)  Intake/Output from previous day: 07/27 0701 - 07/28 0700 In: 450 [P.O.:450] Out: -  Intake/Output this shift: No intake/output data recorded.   Recent Labs  11/28/15 0646 11/29/15 0424 11/30/15 0422  HGB 8.6* 8.4* 8.2*    Recent Labs  11/29/15 0424 11/30/15 0422  WBC 10.0 8.5  RBC 3.23* 3.12*  HCT 25.7* 24.6*  PLT 154 152    Recent Labs  11/29/15 0424 11/30/15 0422  NA 132* 136  K 3.6 3.7  CL 100* 101  CO2 27 28  BUN 14 13  CREATININE 0.71 0.55  GLUCOSE 119* 113*  CALCIUM 8.3* 8.3*   No results for input(s): LABPT, INR in the last 72 hours.  Neurologically intact ABD soft Compartment soft  Assessment/Plan: 3 Days Post-Op Procedure(s) (LRB): TOTAL KNEE ARTHROPLASTY (Right) Discharge home with home health, dressing dry, comfortable, labs stable  Katie Kaiser W 11/30/2015, 7:25 AM

## 2015-11-30 NOTE — Progress Notes (Signed)
Occupational Therapy Treatment Patient Details Name: Katie Kaiser MRN: [id number] DOB: 1945-09-21 Today's Date: 11/30/2015    History of present illness 70 y.o. female s/p Rt TKA. PMH: depression   OT comments  Pt alert and willing to participate in therapy today. Pt able to teach back education surrounding DME. Education with AE was re-stated. Performance level of ADL continues to demonstrate decreased ability from baseline independent. Pt cautious and safety focused. Pt continues to confirm that she will have adequate assistance from friends upon discharge. Discharge plans remain appropriate.   Follow Up Recommendations  Home health OT;Supervision - Intermittent    Equipment Recommendations  None recommended by OT    Recommendations for Other Services      Precautions / Restrictions Precautions Precautions: Knee;Fall Restrictions Weight Bearing Restrictions: Yes RLE Weight Bearing: Partial weight bearing RLE Partial Weight Bearing Percentage or Pounds: 50%       Mobility Bed Mobility Overal bed mobility: Needs Assistance Bed Mobility: Supine to Sit     Supine to sit: Min assist     General bed mobility comments: OT assisted with leg, and also educated Pt on use of towel as tool to help move leg  Transfers Overall transfer level: Needs assistance Equipment used: Rolling walker (2 wheeled) Transfers: Sit to/from Stand Sit to Stand: Min guard         General transfer comment: Pt uses rocking motion with increased time to stand required min guard for safety.      Balance Overall balance assessment: No apparent balance deficits (not formally assessed)                                 ADL Overall ADL's : Needs assistance/impaired     Grooming: Wash/dry hands;Brushing hair;Wash/dry face;Set up;Standing   Upper Body Bathing: Set up;Sitting Upper Body Bathing Details (indicate cue type and reason): Pt used wet/soapy wash cloths to bathe upper  body sitting on 3 in 1 Lower Body Bathing: Sit to/from stand;Minimal assistance Lower Body Bathing Details (indicate cue type and reason): educated in use of long handled bath sponge Upper Body Dressing : Set up;Sitting Upper Body Dressing Details (indicate cue type and reason): Able to don shirt with set up Lower Body Dressing: Maximal assistance;Sit to/from stand Lower Body Dressing Details (indicate cue type and reason): Pt and therapist donned pants. Pt declined underwear and said that she would not be using it at home. Pt educated on AE for LB dressing, and Pt stated that she will not be wearing socks, and she would get help from friends/support system Toilet Transfer: RW;Ambulation;BSC;Min Psychiatric nurse Details (indicate cue type and reason): used towel to help ease leg up and down during use of 3 in 1 over commode Toileting- Clothing Manipulation and Hygiene: Minimal assistance;Sit to/from stand;Total assistance Toileting - Clothing Manipulation Details (indicate cue type and reason): Pt able to manipulate gown, but therapist provided peri care   Tub/Shower Transfer Details (indicate cue type and reason): Pt declined tub transfer practice as she was too nervous about maintaining precautions. Pt stated that she plans on sponge bathing anyway. Pt adament that she would have adequate support to stay clean and get care she needed. Functional mobility during ADLs: Rolling walker;Cueing for sequencing;Min guard        Vision                     Perception  Praxis      Cognition   Behavior During Therapy: WFL for tasks assessed/performed Overall Cognitive Status: Within Functional Limits for tasks assessed                       Extremity/Trunk Assessment               Exercises    Shoulder Instructions       General Comments      Pertinent Vitals/ Pain       Pain Assessment: 0-10 Pain Score: 3  Pain Location: R knee Pain Descriptors /  Indicators: Discomfort;Grimacing;Guarding Pain Intervention(s): Monitored during session;Repositioned  Home Living                                          Prior Functioning/Environment              Frequency Min 3X/week     Progress Toward Goals  OT Goals(current goals can now be found in the care plan section)  Progress towards OT goals: Progressing toward goals  Acute Rehab OT Goals 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, and get back to cutting hair OT Goal Formulation: With patient Time For Goal Achievement: 12/12/15 Potential to Achieve Goals: Good ADL Goals Pt Will Perform Lower Body Dressing: with supervision;with adaptive equipment;sit to/from stand Pt Will Transfer to Toilet: with modified independence;ambulating;bedside commode Pt Will Perform Toileting - Clothing Manipulation and hygiene: with modified independence;sitting/lateral leans;sit to/from stand Pt Will Perform Tub/Shower Transfer: Tub transfer;with min guard assist;ambulating;shower seat;tub bench;rolling walker  Plan Discharge plan remains appropriate    Co-evaluation                 End of Session Equipment Utilized During Treatment: Gait belt;Rolling walker   Activity Tolerance Patient tolerated treatment well   Patient Left in chair;with call bell/phone within reach   Nurse Communication Mobility status        Time: [id number] OT Time Calculation (min): 41 min  Charges: OT General Charges $OT Visit: 1 Procedure OT Treatments $Self Care/Home Management : 23-37 mins $Therapeutic Activity: 8-22 mins  Merri Ray Charlotta Lapaglia OTR/L 11/30/2015, 2:24 PM [phone] (pager)

## 2015-11-30 NOTE — Progress Notes (Signed)
Pt stable for d/c home today per MD. Cleared by PT, equipment has been delivered to hospital room. Pain is managed on PO pain medication, VSS. Discharge instructions and prescriptions reviewed with pt and her friend, all questions were answered. Belongings were packed and sent with pt's friend, pt was assisted to car by volunteer.   [place], Jerry Caras

## 2015-12-01 DIAGNOSIS — Z471 Aftercare following joint replacement surgery: Secondary | ICD-10-CM | POA: Diagnosis not present

## 2015-12-01 DIAGNOSIS — E890 Postprocedural hypothyroidism: Secondary | ICD-10-CM | POA: Diagnosis not present

## 2015-12-01 DIAGNOSIS — F329 Major depressive disorder, single episode, unspecified: Secondary | ICD-10-CM | POA: Diagnosis not present

## 2015-12-01 DIAGNOSIS — Z8701 Personal history of pneumonia (recurrent): Secondary | ICD-10-CM | POA: Diagnosis not present

## 2015-12-01 DIAGNOSIS — E669 Obesity, unspecified: Secondary | ICD-10-CM | POA: Diagnosis not present

## 2015-12-01 DIAGNOSIS — I10 Essential (primary) hypertension: Secondary | ICD-10-CM | POA: Diagnosis not present

## 2015-12-02 LAB — URINE CULTURE: Culture: 20000 — AB

## 2015-12-03 DIAGNOSIS — Z471 Aftercare following joint replacement surgery: Secondary | ICD-10-CM | POA: Diagnosis not present

## 2015-12-03 DIAGNOSIS — E890 Postprocedural hypothyroidism: Secondary | ICD-10-CM | POA: Diagnosis not present

## 2015-12-03 DIAGNOSIS — Z8701 Personal history of pneumonia (recurrent): Secondary | ICD-10-CM | POA: Diagnosis not present

## 2015-12-03 DIAGNOSIS — F329 Major depressive disorder, single episode, unspecified: Secondary | ICD-10-CM | POA: Diagnosis not present

## 2015-12-03 DIAGNOSIS — E669 Obesity, unspecified: Secondary | ICD-10-CM | POA: Diagnosis not present

## 2015-12-03 DIAGNOSIS — I10 Essential (primary) hypertension: Secondary | ICD-10-CM | POA: Diagnosis not present

## 2015-12-05 DIAGNOSIS — E669 Obesity, unspecified: Secondary | ICD-10-CM | POA: Diagnosis not present

## 2015-12-05 DIAGNOSIS — Z8701 Personal history of pneumonia (recurrent): Secondary | ICD-10-CM | POA: Diagnosis not present

## 2015-12-05 DIAGNOSIS — Z471 Aftercare following joint replacement surgery: Secondary | ICD-10-CM | POA: Diagnosis not present

## 2015-12-05 DIAGNOSIS — I10 Essential (primary) hypertension: Secondary | ICD-10-CM | POA: Diagnosis not present

## 2015-12-05 DIAGNOSIS — F329 Major depressive disorder, single episode, unspecified: Secondary | ICD-10-CM | POA: Diagnosis not present

## 2015-12-05 DIAGNOSIS — E890 Postprocedural hypothyroidism: Secondary | ICD-10-CM | POA: Diagnosis not present

## 2015-12-07 DIAGNOSIS — F329 Major depressive disorder, single episode, unspecified: Secondary | ICD-10-CM | POA: Diagnosis not present

## 2015-12-07 DIAGNOSIS — E890 Postprocedural hypothyroidism: Secondary | ICD-10-CM | POA: Diagnosis not present

## 2015-12-07 DIAGNOSIS — E669 Obesity, unspecified: Secondary | ICD-10-CM | POA: Diagnosis not present

## 2015-12-07 DIAGNOSIS — Z471 Aftercare following joint replacement surgery: Secondary | ICD-10-CM | POA: Diagnosis not present

## 2015-12-07 DIAGNOSIS — Z8701 Personal history of pneumonia (recurrent): Secondary | ICD-10-CM | POA: Diagnosis not present

## 2015-12-07 DIAGNOSIS — I10 Essential (primary) hypertension: Secondary | ICD-10-CM | POA: Diagnosis not present

## 2015-12-10 DIAGNOSIS — E669 Obesity, unspecified: Secondary | ICD-10-CM | POA: Diagnosis not present

## 2015-12-10 DIAGNOSIS — Z471 Aftercare following joint replacement surgery: Secondary | ICD-10-CM | POA: Diagnosis not present

## 2015-12-10 DIAGNOSIS — F329 Major depressive disorder, single episode, unspecified: Secondary | ICD-10-CM | POA: Diagnosis not present

## 2015-12-10 DIAGNOSIS — Z8701 Personal history of pneumonia (recurrent): Secondary | ICD-10-CM | POA: Diagnosis not present

## 2015-12-10 DIAGNOSIS — E890 Postprocedural hypothyroidism: Secondary | ICD-10-CM | POA: Diagnosis not present

## 2015-12-10 DIAGNOSIS — M1711 Unilateral primary osteoarthritis, right knee: Secondary | ICD-10-CM | POA: Diagnosis not present

## 2015-12-10 DIAGNOSIS — I10 Essential (primary) hypertension: Secondary | ICD-10-CM | POA: Diagnosis not present

## 2015-12-14 DIAGNOSIS — Z471 Aftercare following joint replacement surgery: Secondary | ICD-10-CM | POA: Diagnosis not present

## 2015-12-14 DIAGNOSIS — E890 Postprocedural hypothyroidism: Secondary | ICD-10-CM | POA: Diagnosis not present

## 2015-12-14 DIAGNOSIS — I10 Essential (primary) hypertension: Secondary | ICD-10-CM | POA: Diagnosis not present

## 2015-12-14 DIAGNOSIS — F329 Major depressive disorder, single episode, unspecified: Secondary | ICD-10-CM | POA: Diagnosis not present

## 2015-12-14 DIAGNOSIS — Z8701 Personal history of pneumonia (recurrent): Secondary | ICD-10-CM | POA: Diagnosis not present

## 2015-12-14 DIAGNOSIS — E669 Obesity, unspecified: Secondary | ICD-10-CM | POA: Diagnosis not present

## 2015-12-19 DIAGNOSIS — E669 Obesity, unspecified: Secondary | ICD-10-CM | POA: Diagnosis not present

## 2015-12-19 DIAGNOSIS — Z8701 Personal history of pneumonia (recurrent): Secondary | ICD-10-CM | POA: Diagnosis not present

## 2015-12-19 DIAGNOSIS — I10 Essential (primary) hypertension: Secondary | ICD-10-CM | POA: Diagnosis not present

## 2015-12-19 DIAGNOSIS — F329 Major depressive disorder, single episode, unspecified: Secondary | ICD-10-CM | POA: Diagnosis not present

## 2015-12-19 DIAGNOSIS — E890 Postprocedural hypothyroidism: Secondary | ICD-10-CM | POA: Diagnosis not present

## 2015-12-19 DIAGNOSIS — Z471 Aftercare following joint replacement surgery: Secondary | ICD-10-CM | POA: Diagnosis not present

## 2015-12-21 DIAGNOSIS — I10 Essential (primary) hypertension: Secondary | ICD-10-CM | POA: Diagnosis not present

## 2015-12-21 DIAGNOSIS — Z8701 Personal history of pneumonia (recurrent): Secondary | ICD-10-CM | POA: Diagnosis not present

## 2015-12-21 DIAGNOSIS — Z471 Aftercare following joint replacement surgery: Secondary | ICD-10-CM | POA: Diagnosis not present

## 2015-12-21 DIAGNOSIS — E890 Postprocedural hypothyroidism: Secondary | ICD-10-CM | POA: Diagnosis not present

## 2015-12-21 DIAGNOSIS — E669 Obesity, unspecified: Secondary | ICD-10-CM | POA: Diagnosis not present

## 2015-12-21 DIAGNOSIS — F329 Major depressive disorder, single episode, unspecified: Secondary | ICD-10-CM | POA: Diagnosis not present

## 2015-12-27 ENCOUNTER — Ambulatory Visit: Payer: Medicare Other | Attending: Orthopaedic Surgery | Admitting: Physical Therapy

## 2015-12-27 ENCOUNTER — Encounter: Payer: Self-pay | Admitting: Physical Therapy

## 2015-12-27 DIAGNOSIS — R2689 Other abnormalities of gait and mobility: Secondary | ICD-10-CM

## 2015-12-27 DIAGNOSIS — M6281 Muscle weakness (generalized): Secondary | ICD-10-CM

## 2015-12-27 DIAGNOSIS — M25661 Stiffness of right knee, not elsewhere classified: Secondary | ICD-10-CM | POA: Diagnosis not present

## 2015-12-27 DIAGNOSIS — M25561 Pain in right knee: Secondary | ICD-10-CM

## 2015-12-27 NOTE — Patient Instructions (Addendum)
Strengthening: Quadriceps Set    Tighten muscles on top of thighs by pushing knees down into surface. Hold __10__ seconds. Repeat __10__ times per set. Do __1__ sets per session. Do ___2_ sessions per day. Place a towel roll under ankle http://orth.exer.us/603   Copyright  VHI. All rights reserved.  Knee Extension Mobilization: Hang (Prone)    With table supporting thighs, place __3__ pound weight on right ankle. Hold _1___ minutes. Repeat _1__ times per set. Do _1__ sets per session. Do __1__ sessions per day.  http://orth.exer.us/723   Copyright  VHI. All rights reserved.  HIP: Flexors - Supine    Lie on edge of surface. Place leg off the surface, allow knee to bend. Bring other knee toward chest. Hold _30__ seconds. __2_ reps per set, __1_ sets per day, 2___ days per week Rest lowered foot on stool.  Copyright  VHI. All rights reserved.  [facility] [address], [place], [place] [address] Phone # [phone] Fax [phone]

## 2015-12-27 NOTE — Therapy (Signed)
[facility] [address] [place], [place], [address] Phone: [phone]   Fax:  [phone]  Physical Therapy Evaluation  Patient Details  Name: Katie Kaiser MRN: [id number] Date of Birth: [birth year] Referring Provider: Dr. Joni Fears  Encounter Date: 12/27/2015      PT End of Session - 12/27/15 1003    Visit Number 1   Number of Visits 10   Date for PT Re-Evaluation 02/21/16   Authorization Type medicare g-code 10th visit   PT Start Time 0930   PT Stop Time 1002   PT Time Calculation (min) 32 min   Activity Tolerance Patient tolerated treatment well   Behavior During Therapy [facility] for tasks assessed/performed      Past Medical History:  Diagnosis Date  . Arthritis   . Depression   . Diverticulosis   . Essential hypertension   . GERD (gastroesophageal reflux disease)   . Heart murmur    slight murmur per pt  . Hypothyroid   . Impaired fasting glucose 5/07  . Kidney stone   . OAB (overactive bladder)   . Obesity   . Pneumonia   . Vitamin D deficiency     Past Surgical History:  Procedure Laterality Date  . APPENDECTOMY    . CESAREAN SECTION    . COLONOSCOPY  02/04/2011   diverticulosis  . ORIF FOREARM FRACTURE  2002  . THYROIDECTOMY  1975  . TONSILLECTOMY    . TOTAL KNEE ARTHROPLASTY Right 11/27/2015   Procedure: TOTAL KNEE ARTHROPLASTY;  Surgeon: Garald Balding, MD;  Location: [place];  Service: Orthopedics;  Laterality: Right;    There were no vitals filed for this visit.       Subjective Assessment - 12/27/15 0935    Subjective Right total knee replacement on 11/27/2015. Hospital stay 3 nights.  [facility] 3 days per [facility] for 3 weeks. 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 Patient uses a stool to sit on.    How long can you walk comfortably? right knee hurst, uses a cane   Patient Stated Goals stop limping   Currently in Pain? Yes   Pain Score 4    Pain  Location Knee   Pain Orientation Right   Pain Descriptors / Indicators Tightness;Sore   Pain Type Surgical pain   Pain Onset More than a month ago   Pain Frequency Constant   Aggravating Factors  standing, walking, moving, getting in and out of car   Pain Relieving Factors sittimg   Multiple Pain Sites No            [facility] PT Assessment - 12/27/15 0001      Assessment   Medical Diagnosis Z96.659 A/P Total knee replacement   Referring Provider Dr. Joni Fears   Onset Date/Surgical Date 11/27/15   Prior Therapy home health     Precautions   Precautions None     Restrictions   Weight Bearing Restrictions No     Balance Screen   Has the patient fallen in the past 6 months No   Has the patient had a decrease in activity level because of a fear of falling?  No   Is the patient reluctant to leave their home because of a fear of falling?  No     Home Ecologist residence     Prior Function   Level of Independence Independent   Vocation Part time employment  Vocation Requirements standing   Leisure gym, swimming, nustep     Cognition   Overall Cognitive Status Within Functional Limits for tasks assessed     Observation/Other Assessments   Skin Integrity scar is red and healing distally, has decreased mobility   Focus on Therapeutic Outcomes (FOTO)  58% limitation  goal is 42% limitation     Observation/Other Assessments-Edema    Edema Circumferential     Circumferential Edema   Circumferential - Right 48cm     ROM / Strength   AROM / PROM / Strength AROM;PROM;Strength     AROM   AROM Assessment Site Knee   Right/Left Knee Right   Right Knee Extension -20  sitting -20   Right Knee Flexion 95     PROM   PROM Assessment Site Knee   Right/Left Knee Right   Right Knee Extension -13   Right Knee Flexion 100     Strength   Overall Strength Comments right hip extension, flexion and abduction is 4/5   Strength Assessment Site  Knee   Right/Left Knee Right   Right Knee Flexion 4-/5   Right Knee Extension 3+/5     Ambulation/Gait   Ambulation/Gait Yes   Ambulation/Gait Assistance 6: Modified independent (Device/Increase time)   Assistive device Straight cane   Gait Pattern Decreased step length - left;Decreased stance time - right;Decreased weight shift to right;Decreased dorsiflexion - right;Right flexed knee in stance;Lateral trunk lean to left;Abducted- right   Stairs Yes   Stairs Assistance 6: Modified independent (Device/Increase time)   Stair Management Technique Two rails;Step to pattern                           PT Education - 12/27/15 1004    Education provided Yes   Education Details hip flexor stretch, quad set, prone knee extension   Person(s) Educated Patient   Methods Explanation;Demonstration;Verbal cues;Handout   Comprehension Returned demonstration;Verbalized understanding          PT Short Term Goals - 12/27/15 1025      PT SHORT TERM GOAL #1   Title independent with initial HEP   Time 4   Period Weeks   Status New     PT SHORT TERM GOAL #2   Title knee flexion PROM >/= 110   Time 4   Period Weeks   Status New     PT SHORT TERM GOAL #3   Title knee extension PROM >/= -5 degrees   Time 4   Period Weeks   Status New     PT SHORT TERM GOAL #4   Title pain with walking and standing decreased >/= 25%   Time 4   Period Weeks   Status New           PT Long Term Goals - 12/27/15 1032      PT LONG TERM GOAL #1   Title independent with HEP and understand how to progress herself at the gym   Time 8   Period Weeks   Status New     PT LONG TERM GOAL #2   Title walk without an assistive device  due to right hip strength 5/5 with minimal deficits   Time 8   Period Weeks   Status New     PT LONG TERM GOAL #3   Title go up and down steps with step over step pattern due to increased right knee strength >/= 4+/5   Time  8   Period Weeks   Status New      PT LONG TERM GOAL #4   Title right knee flexion AROM >/= 115 so it is easier to get in and out of the car   Time 8   Period Weeks   Status New     PT LONG TERM GOAL #5   Title stand at work for 4 hours with pain decreased >/= 75%   Time 8   Period Weeks   Status New     Additional Long Term Goals   Additional Long Term Goals Yes     PT LONG TERM GOAL #6   Title FOTO score is </= 42%   Time 8   Period Weeks   Status New               Plan - 12/27/15 1007    Clinical Impression Statement Patient is a 71 year old female with diagnosis of s/p right total knee replacement on 11/27/2015.  Patient was in the hospital for 3 nights.  She had home health physical therapy for 3x/week for 3 weeks.  Patient reports constant pain in right knee at level 4/10 with increased during standing and walking. Surgical site is healing with some redness on the distal aspect of scar. Right knee extension is -20 degrees passively and actively.  Right knee fleixon actively is 95 degrees and passively is 100 degrees.  Decreased mobility of scar.  Tenderness located in right hamstring, right knee,.  Decreased muscle length of right hamstring, gastroc, and hip flexor. Right hip strength is 4/5.  Right knee extension is 3+/5 and flexion is 4-/5.  Patient amublates with a single point cane, decreased right knee extension, decreased right knee flexion, and leans to the left. Patient is of low complexity.  Patient will benefit from physical therapy to improve right knee flexibility and strength to return to normal function.    Rehab Potential Excellent   PT Frequency 3x / week   PT Duration 8 weeks   PT Treatment/Interventions Cryotherapy;Electrical Stimulation;Functional mobility training;Stair training;Gait training;Ultrasound;Moist Heat;Therapeutic activities;Therapeutic exercise;Balance training;Neuromuscular re-education;Patient/family education;Passive range of motion;Scar mobilization;Manual  techniques;Energy conservation;Vasopneumatic Device   PT Next Visit Plan nustep or bike, soft tissue work, knee ROM and strength exercise, calf stretch, right hip strength   PT Home Exercise Plan progress as needed   Recommended Other Services None   Consulted and Agree with Plan of Care Patient      Patient will benefit from skilled therapeutic intervention in order to improve the following deficits and impairments:  Abnormal gait, Pain, Decreased strength, Increased edema, Decreased scar mobility, Impaired flexibility, Decreased activity tolerance, Decreased endurance, Decreased coordination, Decreased range of motion, Increased fascial restricitons, Increased muscle spasms, Decreased mobility, Difficulty walking  Visit Diagnosis: Muscle weakness (generalized) - Plan: PT plan of care cert/re-cert  Other abnormalities of gait and mobility - Plan: PT plan of care cert/re-cert  Pain in right knee - Plan: PT plan of care cert/re-cert  Stiffness of right knee, not elsewhere classified - Plan: PT plan of care cert/re-cert      G-Codes - [id number]    Functional Assessment Tool Used FOTO score is 58% limitation  goal is 42% limitation   Functional Limitation Mobility: Walking and moving around   Mobility: Walking and Moving Around Current Status [id number]) At least 40 percent but less than 60 percent impaired, limited or restricted   Mobility: Walking and Moving Around Goal Status [id number]) At least 40 percent  but less than 60 percent impaired, limited or restricted       Problem List Patient Active Problem List   Diagnosis Date Noted  . Primary osteoarthritis of right knee 11/27/2015  . S/P total knee replacement using cement 11/27/2015  . Overactive bladder 11/15/2015  . Essential hypertension 04/17/2015  . Atherosclerosis of both carotid arteries 03/14/2015  . Advance care planning 08/21/2014  . Vitamin D deficiency 06/08/2013  . Depression, major, in remission ([place]) 06/08/2013   . Obesity, Class III, BMI 40-49.9 (morbid obesity) ([place]) 02/04/2011  . Hypothyroidism 10/21/2010    Earlie Counts, PT 12/27/15 10:40 AM   [facility] [address] [place], [place], [address] Phone: [phone]   Fax:  [phone]  Name: TEDDY FAIRWEATHER MRN: [id number] Date of Birth: [birth year]

## 2015-12-28 ENCOUNTER — Ambulatory Visit: Payer: Medicare Other | Admitting: Physical Therapy

## 2015-12-28 ENCOUNTER — Encounter: Payer: Self-pay | Admitting: Physical Therapy

## 2015-12-28 DIAGNOSIS — M6281 Muscle weakness (generalized): Secondary | ICD-10-CM | POA: Diagnosis not present

## 2015-12-28 DIAGNOSIS — M25661 Stiffness of right knee, not elsewhere classified: Secondary | ICD-10-CM | POA: Diagnosis not present

## 2015-12-28 DIAGNOSIS — R2689 Other abnormalities of gait and mobility: Secondary | ICD-10-CM | POA: Diagnosis not present

## 2015-12-28 DIAGNOSIS — M25561 Pain in right knee: Secondary | ICD-10-CM | POA: Diagnosis not present

## 2015-12-28 NOTE — Therapy (Signed)
[facility] [address], [place], [place], [address] Phone: [phone]   Fax:  [phone]  Physical Therapy Treatment  Patient Details  Name: Katie Kaiser MRN: [id number] Date of Birth: 1946-04-14 Referring Provider: Dr. Joni Fears  Encounter Date: 12/28/2015      PT End of Session - 12/28/15 0809    Visit Number 2   Number of Visits 10   Date for PT Re-Evaluation 02/21/16   Authorization Type medicare g-code 10th visit   PT Start Time 0756   PT Stop Time 0850   PT Time Calculation (min) 54 min   Activity Tolerance Patient tolerated treatment well   Behavior During Therapy [facility] for tasks assessed/performed      Past Medical History:  Diagnosis Date  . Arthritis   . Depression   . Diverticulosis   . Essential hypertension   . GERD (gastroesophageal reflux disease)   . Heart murmur    slight murmur per pt  . Hypothyroid   . Impaired fasting glucose 5/07  . Kidney stone   . OAB (overactive bladder)   . Obesity   . Pneumonia   . Vitamin D deficiency     Past Surgical History:  Procedure Laterality Date  . APPENDECTOMY    . CESAREAN SECTION    . COLONOSCOPY  02/04/2011   diverticulosis  . ORIF FOREARM FRACTURE  2002  . THYROIDECTOMY  1975  . TONSILLECTOMY    . TOTAL KNEE ARTHROPLASTY Right 11/27/2015   Procedure: TOTAL KNEE ARTHROPLASTY;  Surgeon: Garald Balding, MD;  Location: [place];  Service: Orthopedics;  Laterality: Right;    There were no vitals filed for this visit.      Subjective Assessment - 12/28/15 0758    Subjective Pt reports Rt LE feeling better today after therapist stretched yesterday. Pt is not using straight cane and feels safe without.    How long can you walk comfortably? right knee hurst, uses a cane   Patient Stated Goals stop limping   Currently in Pain? Yes   Pain Score 3    Pain Location Knee   Pain Orientation Right   Pain Descriptors / Indicators  Tightness;Sore   Pain Onset More than a month ago   Pain Frequency Constant   Aggravating Factors  Standing walking moving getting in and out of car   Pain Relieving Factors sitting   Multiple Pain Sites No                         [facility] Adult PT Treatment/Exercise - 12/28/15 0001      Exercises   Exercises Knee/Hip     Knee/Hip Exercises: Aerobic   Nustep L1 x 5     Knee/Hip Exercises: Standing   Hip Abduction AROM;Stengthening;Both;2 sets;10 reps;Knee straight   Other Standing Knee Exercises Weight shifting     Knee/Hip Exercises: Seated   Long Arc Quad AAROM;Strengthening;3 sets;10 reps   Ball Squeeze 3x10   Clamshell with TheraBand Yellow  3x10     Knee/Hip Exercises: Supine   Quad Sets AROM;Strengthening;Right;1 set;20 reps   Heel Slides AROM;Strengthening;Right;3 sets;10 reps     Manual Therapy   Manual Therapy Passive ROM   Manual therapy comments Passive stretching to knee in extensoin and flexion   Passive ROM 91*-(-11*)                PT Education - 12/27/15 1004    Education provided  Yes   Education Details hip flexor stretch, quad set, prone knee extension   Person(s) Educated Patient   Methods Explanation;Demonstration;Verbal cues;Handout   Comprehension Returned demonstration;Verbalized understanding          PT Short Term Goals - 12/27/15 1025      PT SHORT TERM GOAL #1   Title independent with initial HEP   Time 4   Period Weeks   Status New     PT SHORT TERM GOAL #2   Title knee flexion PROM >/= 110   Time 4   Period Weeks   Status New     PT SHORT TERM GOAL #3   Title knee extension PROM >/= -5 degrees   Time 4   Period Weeks   Status New     PT SHORT TERM GOAL #4   Title pain with walking and standing decreased >/= 25%   Time 4   Period Weeks   Status New           PT Long Term Goals - 12/27/15 1032      PT LONG TERM GOAL #1   Title independent with HEP and understand how to progress herself  at the gym   Time 8   Period Weeks   Status New     PT LONG TERM GOAL #2   Title walk without an assistive device  due to right hip strength 5/5 with minimal deficits   Time 8   Period Weeks   Status New     PT LONG TERM GOAL #3   Title go up and down steps with step over step pattern due to increased right knee strength >/= 4+/5   Time 8   Period Weeks   Status New     PT LONG TERM GOAL #4   Title right knee flexion AROM >/= 115 so it is easier to get in and out of the car   Time 8   Period Weeks   Status New     PT LONG TERM GOAL #5   Title stand at work for 4 hours with pain decreased >/= 75%   Time 8   Period Weeks   Status New     Additional Long Term Goals   Additional Long Term Goals Yes     PT LONG TERM GOAL #6   Title FOTO score is </= 42%   Time 8   Period Weeks   Status New               Plan - 12/28/15 [id number]    Clinical Impression Statement Pt presents s/p Rt TKA. Amb without AD antalgic gait due to pain. Able to tolerate full weight bearing in Rt knee for standing exercises. Limited knee ROM in flexion and extension. Decrease quad activation with quad sets. Knee ROM measured knee extension -11* in supine, 91* knee flexion in supine.    PT Frequency 3x / week   PT Duration 8 weeks   PT Treatment/Interventions Cryotherapy;Electrical Stimulation;Functional mobility training;Stair training;Gait training;Ultrasound;Moist Heat;Therapeutic activities;Therapeutic exercise;Balance training;Neuromuscular re-education;Patient/family education;Passive range of motion;Scar mobilization;Manual techniques;Energy conservation;Vasopneumatic Device   PT Next Visit Plan nustep or bike, soft tissue work, knee ROM and strength exercise, calf stretch, right hip strength   PT Home Exercise Plan progress as needed   Consulted and Agree with Plan of Care Patient      Patient will benefit from skilled therapeutic intervention in order to improve the following deficits and  impairments:  Abnormal gait,  Pain, Decreased strength, Increased edema, Decreased scar mobility, Impaired flexibility, Decreased activity tolerance, Decreased endurance, Decreased coordination, Decreased range of motion, Increased fascial restricitons, Increased muscle spasms, Decreased mobility, Difficulty walking  Visit Diagnosis: Muscle weakness (generalized)  Other abnormalities of gait and mobility  Pain in right knee  Stiffness of right knee, not elsewhere classified       G-Codes - 01/11/16 0927    Functional Assessment Tool Used FOTO score is 58% limitation  goal is 42% limitation   Functional Limitation Mobility: Walking and moving around   Mobility: Walking and Moving Around Current Status [phone]) At least 40 percent but less than 60 percent impaired, limited or restricted   Mobility: Walking and Moving Around Goal Status [phone]) At least 40 percent but less than 60 percent impaired, limited or restricted      Problem List Patient Active Problem List   Diagnosis Date Noted  . Primary osteoarthritis of right knee 11/27/2015  . S/P total knee replacement using cement 11/27/2015  . Overactive bladder 11/15/2015  . Essential hypertension 04/17/2015  . Atherosclerosis of both carotid arteries 03/14/2015  . Advance care planning 08/21/2014  . Vitamin D deficiency 06/08/2013  . Depression, major, in remission ([place]) 06/08/2013  . Obesity, Class III, BMI 40-49.9 (morbid obesity) ([place]) 02/04/2011  . Hypothyroidism 10/21/2010    Mikle Bosworth PTA 12/28/2015, 8:43 AM  [facility] [address], [place], [place], [address] Phone: [phone]   Fax:  [phone]  Name: Katie Kaiser MRN: [id number] Date of Birth: [birth year]

## 2016-01-02 ENCOUNTER — Ambulatory Visit: Payer: Medicare Other | Admitting: Physical Therapy

## 2016-01-02 DIAGNOSIS — M25661 Stiffness of right knee, not elsewhere classified: Secondary | ICD-10-CM | POA: Diagnosis not present

## 2016-01-02 DIAGNOSIS — R2689 Other abnormalities of gait and mobility: Secondary | ICD-10-CM | POA: Diagnosis not present

## 2016-01-02 DIAGNOSIS — M25561 Pain in right knee: Secondary | ICD-10-CM | POA: Diagnosis not present

## 2016-01-02 DIAGNOSIS — M6281 Muscle weakness (generalized): Secondary | ICD-10-CM | POA: Diagnosis not present

## 2016-01-02 NOTE — Therapy (Signed)
[facility] [address], [place], [place], [address] Phone: [phone]   Fax:  [phone]  Physical Therapy Treatment  Patient Details  Name: Katie Kaiser MRN: [id number] Date of Birth: Feb 02, 1946 Referring Provider: Dr. Joni Fears  Encounter Date: 01/02/2016      PT End of Session - 01/02/16 1500    Visit Number 3   Number of Visits 10   Date for PT Re-Evaluation 02/21/16   Authorization Type medicare g-code 10th visit   PT Start Time 1446   PT Stop Time 1540   PT Time Calculation (min) 54 min   Activity Tolerance Patient tolerated treatment well   Behavior During Therapy [facility] for tasks assessed/performed      Past Medical History:  Diagnosis Date  . Arthritis   . Depression   . Diverticulosis   . Essential hypertension   . GERD (gastroesophageal reflux disease)   . Heart murmur    slight murmur per pt  . Hypothyroid   . Impaired fasting glucose 5/07  . Kidney stone   . OAB (overactive bladder)   . Obesity   . Pneumonia   . Vitamin D deficiency     Past Surgical History:  Procedure Laterality Date  . APPENDECTOMY    . CESAREAN SECTION    . COLONOSCOPY  02/04/2011   diverticulosis  . ORIF FOREARM FRACTURE  2002  . THYROIDECTOMY  1975  . TONSILLECTOMY    . TOTAL KNEE ARTHROPLASTY Right 11/27/2015   Procedure: TOTAL KNEE ARTHROPLASTY;  Surgeon: Garald Balding, MD;  Location: [place];  Service: Orthopedics;  Laterality: Right;    There were no vitals filed for this visit.      Subjective Assessment - 01/02/16 1450    Subjective Pt reports Rt knee feeling better today. Had increased pain on Saturday so did exercises on Sunday and felt better. Reports being able to get in and out of the car easier.    How long can you walk comfortably? right knee hurst, uses a cane   Patient Stated Goals stop limping   Pain Score 2    Pain Location Knee   Pain Orientation Right   Pain Descriptors  / Indicators Tightness   Pain Type Surgical pain   Pain Onset More than a month ago   Pain Frequency Constant   Aggravating Factors  Walking long distances.    Pain Relieving Factors Sitting   Multiple Pain Sites No                         [facility] Adult PT Treatment/Exercise - 01/02/16 0001      Knee/Hip Exercises: Aerobic   Nustep L1 x 6     Knee/Hip Exercises: Seated   Long Arc Quad AAROM;Strengthening;3 sets;10 reps   Ball Squeeze 3x10   Clamshell with TheraBand Red  3x10   Hamstring Curl AAROM;Strengthening;Right;3 sets;10 reps     Knee/Hip Exercises: Supine   Quad Sets AROM;Strengthening;Right;1 set;20 reps   Heel Slides --   Terminal Knee Extension AROM;Strengthening;Right;2 sets;10 reps   Straight Leg Raises AROM;Strengthening     Modalities   Modalities Vasopneumatic     Vasopneumatic   Number Minutes Vasopneumatic  15 minutes   Vasopnuematic Location  Knee   Vasopneumatic Pressure Medium   Vasopneumatic Temperature  3 snow flakes     Manual Therapy   Manual Therapy Passive ROM;Soft tissue mobilization   Manual therapy comments PROM knee  flexion and extension   Soft tissue mobilization to distal hamstring and quads. Scar mobilization                  PT Short Term Goals - 01/02/16 1453      PT SHORT TERM GOAL #1   Title independent with initial HEP   Time 4   Period Weeks   Status On-going     PT SHORT TERM GOAL #2   Title knee flexion PROM >/= 110   Time 4   Period Weeks   Status On-going     PT SHORT TERM GOAL #3   Title knee extension PROM >/= -5 degrees   Time 4   Period Weeks   Status On-going     PT SHORT TERM GOAL #4   Title pain with walking and standing decreased >/= 25%   Time 4   Period Weeks   Status On-going           PT Long Term Goals - 01/02/16 1459      PT LONG TERM GOAL #1   Title independent with HEP and understand how to progress herself at the gym   Time 8   Period Weeks   Status  On-going     PT LONG TERM GOAL #2   Title walk without an assistive device  due to right hip strength 5/5 with minimal deficits   Time 8   Period Weeks   Status On-going     PT LONG TERM GOAL #3   Title go up and down steps with step over step pattern due to increased right knee strength >/= 4+/5   Time 8   Period Weeks   Status On-going     PT LONG TERM GOAL #4   Title right knee flexion AROM >/= 115 so it is easier to get in and out of the car   Time 8   Period Weeks   Status On-going     PT LONG TERM GOAL #5   Title stand at work for 4 hours with pain decreased >/= 75%   Time 8   Period Weeks   Status On-going     PT LONG TERM GOAL #6   Title FOTO score is </= 42%   Time 8   Period Weeks   Status On-going               Plan - 01/02/16 1507    Clinical Impression Statement Pt reports knee feeling good after last treatment. Had increased pain over the weekend but is better today. Able to initiate quad activation with quad set much better today. Manual massage to knee to decrease pain and increase ROM.  Will continue to strengthen and stabilize and increase ROM.    Rehab Potential Excellent   PT Frequency 3x / week   PT Duration 8 weeks   PT Treatment/Interventions Cryotherapy;Electrical Stimulation;Functional mobility training;Stair training;Gait training;Ultrasound;Moist Heat;Therapeutic activities;Therapeutic exercise;Balance training;Neuromuscular re-education;Patient/family education;Passive range of motion;Scar mobilization;Manual techniques;Energy conservation;Vasopneumatic Device   PT Next Visit Plan nustep or bike, soft tissue work, knee ROM and strength exercise, calf stretch, right hip strength   PT Home Exercise Plan progress as needed   Consulted and Agree with Plan of Care Patient      Patient will benefit from skilled therapeutic intervention in order to improve the following deficits and impairments:  Abnormal gait, Pain, Decreased strength,  Increased edema, Decreased scar mobility, Impaired flexibility, Decreased activity tolerance, Decreased endurance, Decreased coordination, Decreased  range of motion, Increased fascial restricitons, Increased muscle spasms, Decreased mobility, Difficulty walking  Visit Diagnosis: Muscle weakness (generalized)  Other abnormalities of gait and mobility  Pain in right knee  Stiffness of right knee, not elsewhere classified     Problem List Patient Active Problem List   Diagnosis Date Noted  . Primary osteoarthritis of right knee 11/27/2015  . S/P total knee replacement using cement 11/27/2015  . Overactive bladder 11/15/2015  . Essential hypertension 04/17/2015  . Atherosclerosis of both carotid arteries 03/14/2015  . Advance care planning 08/21/2014  . Vitamin D deficiency 06/08/2013  . Depression, major, in remission ([place]) 06/08/2013  . Obesity, Class III, BMI 40-49.9 (morbid obesity) ([place]) 02/04/2011  . Hypothyroidism 10/21/2010    Mikle Bosworth PTA 01/02/2016, 3:32 PM  [facility] [address] [place], [place], [address] Phone: [phone]   Fax:  [phone]  Name: Katie Kaiser MRN: [id number] Date of Birth: February 28, 1946

## 2016-01-04 ENCOUNTER — Other Ambulatory Visit: Payer: Self-pay | Admitting: Family Medicine

## 2016-01-04 ENCOUNTER — Encounter: Payer: Self-pay | Admitting: Physical Therapy

## 2016-01-04 ENCOUNTER — Ambulatory Visit: Payer: Medicare Other | Attending: Orthopaedic Surgery | Admitting: Physical Therapy

## 2016-01-04 DIAGNOSIS — M25561 Pain in right knee: Secondary | ICD-10-CM

## 2016-01-04 DIAGNOSIS — M25661 Stiffness of right knee, not elsewhere classified: Secondary | ICD-10-CM

## 2016-01-04 DIAGNOSIS — F325 Major depressive disorder, single episode, in full remission: Secondary | ICD-10-CM

## 2016-01-04 DIAGNOSIS — M6281 Muscle weakness (generalized): Secondary | ICD-10-CM

## 2016-01-04 DIAGNOSIS — R2689 Other abnormalities of gait and mobility: Secondary | ICD-10-CM | POA: Insufficient documentation

## 2016-01-04 NOTE — Therapy (Signed)
[facility] [address] [place], [place], [address] Phone: [phone]   Fax:  [phone]  Physical Therapy Treatment  Patient Details  Name: Katie Kaiser MRN: [id number] Date of Birth: 04-08-1946 Referring Provider: Dr. Joni Fears  Encounter Date: 01/04/2016      PT End of Session - 01/04/16 1143    Visit Number 4   Number of Visits 10   Date for PT Re-Evaluation 02/21/16   Authorization Type medicare g-code 10th visit   PT Start Time 1104   PT Stop Time 1200   PT Time Calculation (min) 56 min   Activity Tolerance Patient tolerated treatment well   Behavior During Therapy [facility] for tasks assessed/performed      Past Medical History:  Diagnosis Date  . Arthritis   . Depression   . Diverticulosis   . Essential hypertension   . GERD (gastroesophageal reflux disease)   . Heart murmur    slight murmur per pt  . Hypothyroid   . Impaired fasting glucose 5/07  . Kidney stone   . OAB (overactive bladder)   . Obesity   . Pneumonia   . Vitamin D deficiency     Past Surgical History:  Procedure Laterality Date  . APPENDECTOMY    . CESAREAN SECTION    . COLONOSCOPY  02/04/2011   diverticulosis  . ORIF FOREARM FRACTURE  2002  . THYROIDECTOMY  1975  . TONSILLECTOMY    . TOTAL KNEE ARTHROPLASTY Right 11/27/2015   Procedure: TOTAL KNEE ARTHROPLASTY;  Surgeon: Garald Balding, MD;  Location: [place];  Service: Orthopedics;  Laterality: Right;    There were no vitals filed for this visit.      Subjective Assessment - 01/04/16 1107    Subjective Pt reports some increased knee pain today after waiting to take pain pill.    How long can you walk comfortably? right knee hurst, uses a cane   Patient Stated Goals stop limping   Currently in Pain? Yes   Pain Score 4    Pain Location Knee   Pain Orientation Right   Pain Descriptors / Indicators Tightness   Pain Type Surgical pain   Pain Onset More  than a month ago   Pain Frequency Constant   Aggravating Factors  Walking long distances   Pain Relieving Factors Sitting   Multiple Pain Sites No                         [facility] Adult PT Treatment/Exercise - 01/04/16 0001      Knee/Hip Exercises: Aerobic   Stationary Bike L1 x 27mins     Knee/Hip Exercises: Seated   Long Arc Quad AAROM;Strengthening;3 sets;10 reps   Ball Squeeze 3x10   Clamshell with TheraBand Red  3x10   Hamstring Curl AAROM;Strengthening;Right;3 sets;10 reps     Knee/Hip Exercises: Supine   Terminal Knee Extension AROM;Strengthening;Right;2 sets;10 reps   Straight Leg Raises AROM;Strengthening     Modalities   Modalities Vasopneumatic     Vasopneumatic   Number Minutes Vasopneumatic  15 minutes   Vasopnuematic Location  Knee   Vasopneumatic Pressure Medium   Vasopneumatic Temperature  3 snow flakes     Manual Therapy   Manual Therapy Passive ROM;Soft tissue mobilization   Manual therapy comments PROM knee flexion and extension   Soft tissue mobilization to distal hamstring and quads. Scar mobilization  PT Short Term Goals - 01/02/16 1453      PT SHORT TERM GOAL #1   Title independent with initial HEP   Time 4   Period Weeks   Status On-going     PT SHORT TERM GOAL #2   Title knee flexion PROM >/= 110   Time 4   Period Weeks   Status On-going     PT SHORT TERM GOAL #3   Title knee extension PROM >/= -5 degrees   Time 4   Period Weeks   Status On-going     PT SHORT TERM GOAL #4   Title pain with walking and standing decreased >/= 25%   Time 4   Period Weeks   Status On-going           PT Long Term Goals - 01/02/16 1459      PT LONG TERM GOAL #1   Title independent with HEP and understand how to progress herself at the gym   Time 8   Period Weeks   Status On-going     PT LONG TERM GOAL #2   Title walk without an assistive device  due to right hip strength 5/5 with minimal deficits    Time 8   Period Weeks   Status On-going     PT LONG TERM GOAL #3   Title go up and down steps with step over step pattern due to increased right knee strength >/= 4+/5   Time 8   Period Weeks   Status On-going     PT LONG TERM GOAL #4   Title right knee flexion AROM >/= 115 so it is easier to get in and out of the car   Time 8   Period Weeks   Status On-going     PT LONG TERM GOAL #5   Title stand at work for 4 hours with pain decreased >/= 75%   Time 8   Period Weeks   Status On-going     PT LONG TERM GOAL #6   Title FOTO score is </= 42%   Time 8   Period Weeks   Status On-going               Plan - 01/04/16 1145    Clinical Impression Statement Pt reports some increased knee pain today. Pt waited longer to take pain pill. Able to tolerate stationary bike today. Quad activation is better today with some tactile cueing. Will progress to more standing exercises.    Rehab Potential Excellent   PT Frequency 3x / week   PT Duration 8 weeks   PT Treatment/Interventions Cryotherapy;Electrical Stimulation;Functional mobility training;Stair training;Gait training;Ultrasound;Moist Heat;Therapeutic activities;Therapeutic exercise;Balance training;Neuromuscular re-education;Patient/family education;Passive range of motion;Scar mobilization;Manual techniques;Energy conservation;Vasopneumatic Device   PT Next Visit Plan nustep or bike, soft tissue work, knee ROM and strength exercise, calf stretch, right hip strength, progress with standing exercises   PT Home Exercise Plan progress as needed   Consulted and Agree with Plan of Care Patient      Patient will benefit from skilled therapeutic intervention in order to improve the following deficits and impairments:  Abnormal gait, Pain, Decreased strength, Increased edema, Decreased scar mobility, Impaired flexibility, Decreased activity tolerance, Decreased endurance, Decreased coordination, Decreased range of motion, Increased  fascial restricitons, Increased muscle spasms, Decreased mobility, Difficulty walking  Visit Diagnosis: Muscle weakness (generalized)  Other abnormalities of gait and mobility  Pain in right knee  Stiffness of right knee, not elsewhere classified  Problem List Patient Active Problem List   Diagnosis Date Noted  . Primary osteoarthritis of right knee 11/27/2015  . S/P total knee replacement using cement 11/27/2015  . Overactive bladder 11/15/2015  . Essential hypertension 04/17/2015  . Atherosclerosis of both carotid arteries 03/14/2015  . Advance care planning 08/21/2014  . Vitamin D deficiency 06/08/2013  . Depression, major, in remission ([place]) 06/08/2013  . Obesity, Class III, BMI 40-49.9 (morbid obesity) ([place]) 02/04/2011  . Hypothyroidism 10/21/2010    Mikle Bosworth PTA 01/04/2016, 11:49 AM   [facility] [address] [place], [place], [address] Phone: [phone]   Fax:  [phone]  Name: Katie Kaiser MRN: [id number] Date of Birth: Sep 07, 1945

## 2016-01-08 ENCOUNTER — Telehealth: Payer: Self-pay | Admitting: Family Medicine

## 2016-01-08 DIAGNOSIS — E039 Hypothyroidism, unspecified: Secondary | ICD-10-CM

## 2016-01-08 NOTE — Telephone Encounter (Signed)
If she has no insurance, she should compare the cost of generic levothyroxine to the cost of branded Synthroid from [facility].  Please discuss these options with pt (let her know cost from [place], she can check cost from [place], and then decide.  I prefer branded, but if there is a significant cost difference, I'm happy to change to generic.  But [place] [facility] provides a lower cost option for the brand that she should be aware of.  Also--I believe the Synthroid rep told us that the [facility] in [place] offers an even less expensive branded Synthroid option (but you have to go to [facility] to get).    Veronica--please advise pt of options

## 2016-01-08 NOTE — Telephone Encounter (Signed)
Looks like the last refill in June was for a year (90 with 3 refills)--shouldn't be needed.

## 2016-01-08 NOTE — Telephone Encounter (Signed)
Is this okay to refill? 

## 2016-01-08 NOTE — Telephone Encounter (Signed)
[facility] called & states pt is switching her medications there, and pt is requesting due to her not having insurance that she get the generic Synthroid.  She states she has taken the generic in the past.  Please advise [facility] t# [phone]

## 2016-01-09 ENCOUNTER — Ambulatory Visit: Payer: Medicare Other | Admitting: Physical Therapy

## 2016-01-09 ENCOUNTER — Encounter: Payer: Self-pay | Admitting: Physical Therapy

## 2016-01-09 DIAGNOSIS — M25561 Pain in right knee: Secondary | ICD-10-CM | POA: Diagnosis not present

## 2016-01-09 DIAGNOSIS — R2689 Other abnormalities of gait and mobility: Secondary | ICD-10-CM

## 2016-01-09 DIAGNOSIS — M25661 Stiffness of right knee, not elsewhere classified: Secondary | ICD-10-CM | POA: Diagnosis not present

## 2016-01-09 DIAGNOSIS — M6281 Muscle weakness (generalized): Secondary | ICD-10-CM | POA: Diagnosis not present

## 2016-01-09 MED ORDER — SYNTHROID 200 MCG PO TABS: 200.0000 ug | ORAL_TABLET | Freq: Every day | ORAL | 0 refills | Status: DC

## 2016-01-09 NOTE — Telephone Encounter (Signed)
Spoke with patient and sent rx to [facility].

## 2016-01-09 NOTE — Therapy (Signed)
[facility] [address] [place], [place], [address] Phone: [phone]   Fax:  [phone]  Physical Therapy Treatment  Patient Details  Name: Katie Kaiser MRN: [id number] Date of Birth: 06-30-1945 Referring Provider: Dr. Joni Fears  Encounter Date: 01/09/2016      PT End of Session - 01/09/16 1708    Visit Number 5   Number of Visits 10   Date for PT Re-Evaluation 02/21/16   Authorization Type medicare g-code 10th visit   PT Start Time [id number]   PT Stop Time 1540   PT Time Calculation (min) 57 min   Activity Tolerance Patient tolerated treatment well   Behavior During Therapy [facility] for tasks assessed/performed      Past Medical History:  Diagnosis Date  . Arthritis   . Depression   . Diverticulosis   . Essential hypertension   . GERD (gastroesophageal reflux disease)   . Heart murmur    slight murmur per pt  . Hypothyroid   . Impaired fasting glucose 5/07  . Kidney stone   . OAB (overactive bladder)   . Obesity   . Pneumonia   . Vitamin D deficiency     Past Surgical History:  Procedure Laterality Date  . APPENDECTOMY    . CESAREAN SECTION    . COLONOSCOPY  02/04/2011   diverticulosis  . ORIF FOREARM FRACTURE  2002  . THYROIDECTOMY  1975  . TONSILLECTOMY    . TOTAL KNEE ARTHROPLASTY Right 11/27/2015   Procedure: TOTAL KNEE ARTHROPLASTY;  Surgeon: Garald Balding, MD;  Location: [place];  Service: Orthopedics;  Laterality: Right;    There were no vitals filed for this visit.      Subjective Assessment - 01/09/16 1451    Subjective Pt reports increased pain in knee last night unable to sleep.    How long can you walk comfortably? right knee hurst, uses a cane   Patient Stated Goals stop limping   Currently in Pain? Yes   Pain Score 4    Pain Location Knee   Pain Orientation Right   Pain Descriptors / Indicators Tightness   Pain Type Surgical pain   Pain Onset More than a month ago    Aggravating Factors  Walking   Pain Relieving Factors Sitting   Multiple Pain Sites No                         [facility] Adult PT Treatment/Exercise - 01/09/16 0001      Knee/Hip Exercises: Aerobic   Stationary Bike L1 x 15mins     Knee/Hip Exercises: Seated   Ball Squeeze 3x10   Clamshell with TheraBand Red  3x10     Knee/Hip Exercises: Supine   Quad Sets AROM;Strengthening;Right;1 set;20 reps   Straight Leg Raises AROM;Strengthening     Modalities   Modalities Vasopneumatic     Vasopneumatic   Number Minutes Vasopneumatic  15 minutes   Vasopnuematic Location  Knee   Vasopneumatic Pressure Medium   Vasopneumatic Temperature  3 snow flakes     Manual Therapy   Manual Therapy Soft tissue mobilization   Manual therapy comments IASTM and orange spiky ball   Soft tissue mobilization Elongation of hamstrings                  PT Short Term Goals - 01/09/16 1453      PT SHORT TERM GOAL #1   Title independent with initial  HEP   Time 4   Period Weeks   Status On-going     PT SHORT TERM GOAL #2   Title knee flexion PROM >/= 110   Time 4   Period Weeks   Status On-going     PT SHORT TERM GOAL #3   Title knee extension PROM >/= -5 degrees   Time 4   Period Weeks   Status On-going     PT SHORT TERM GOAL #4   Title pain with walking and standing decreased >/= 25%   Time 4   Period Weeks   Status On-going           PT Long Term Goals - 01/09/16 1454      PT LONG TERM GOAL #1   Title independent with HEP and understand how to progress herself at the gym   Time 8   Period Weeks   Status On-going     PT LONG TERM GOAL #2   Title walk without an assistive device  due to right hip strength 5/5 with minimal deficits   Time 8   Period Weeks   Status On-going     PT LONG TERM GOAL #3   Title go up and down steps with step over step pattern due to increased right knee strength >/= 4+/5   Time 8   Period Weeks   Status On-going      PT LONG TERM GOAL #4   Title right knee flexion AROM >/= 115 so it is easier to get in and out of the car   Time 8   Period Weeks   Status On-going     PT LONG TERM GOAL #5   Title stand at work for 4 hours with pain decreased >/= 75%   Time 8   Period Weeks   Status On-going     PT LONG TERM GOAL #6   Title FOTO score is </= 42%   Time 8   Period Weeks   Status On-going               Plan - 01/09/16 1710    Clinical Impression Statement Pt reports some increased knee pain today. Able to tolerate exercises well. Manual massage and mayofacial mobilization to Rt hamstrings. Decreased pain after manual. Continue to increase strength and ROM.    Rehab Potential Excellent   PT Frequency 3x / week   PT Duration 8 weeks   PT Treatment/Interventions Cryotherapy;Electrical Stimulation;Functional mobility training;Stair training;Gait training;Ultrasound;Moist Heat;Therapeutic activities;Therapeutic exercise;Balance training;Neuromuscular re-education;Patient/family education;Passive range of motion;Scar mobilization;Manual techniques;Energy conservation;Vasopneumatic Device   PT Next Visit Plan nustep or bike, soft tissue work, knee ROM and strength exercise, calf stretch, right hip strength, progress with standing exercises   PT Home Exercise Plan progress as needed   Consulted and Agree with Plan of Care Patient      Patient will benefit from skilled therapeutic intervention in order to improve the following deficits and impairments:  Abnormal gait, Pain, Decreased strength, Increased edema, Decreased scar mobility, Impaired flexibility, Decreased activity tolerance, Decreased endurance, Decreased coordination, Decreased range of motion, Increased fascial restricitons, Increased muscle spasms, Decreased mobility, Difficulty walking  Visit Diagnosis: Muscle weakness (generalized)  Other abnormalities of gait and mobility  Pain in right knee  Stiffness of right knee, not  elsewhere classified     Problem List Patient Active Problem List   Diagnosis Date Noted  . Primary osteoarthritis of right knee 11/27/2015  . S/P total knee replacement using cement  11/27/2015  . Overactive bladder 11/15/2015  . Essential hypertension 04/17/2015  . Atherosclerosis of both carotid arteries 03/14/2015  . Advance care planning 08/21/2014  . Vitamin D deficiency 06/08/2013  . Depression, major, in remission ([place]) 06/08/2013  . Obesity, Class III, BMI 40-49.9 (morbid obesity) ([place]) 02/04/2011  . Hypothyroidism 10/21/2010    Mikle Bosworth PTA 01/09/2016, 5:14 PM  [facility] [address] [place], [place], [address] Phone: [phone]   Fax:  [phone]  Name: Katie Kaiser MRN: [id number] Date of Birth: December 29, 1945

## 2016-01-11 ENCOUNTER — Encounter: Payer: Self-pay | Admitting: Physical Therapy

## 2016-01-11 ENCOUNTER — Ambulatory Visit: Payer: Medicare Other | Admitting: Physical Therapy

## 2016-01-11 DIAGNOSIS — M25661 Stiffness of right knee, not elsewhere classified: Secondary | ICD-10-CM | POA: Diagnosis not present

## 2016-01-11 DIAGNOSIS — R2689 Other abnormalities of gait and mobility: Secondary | ICD-10-CM

## 2016-01-11 DIAGNOSIS — M6281 Muscle weakness (generalized): Secondary | ICD-10-CM | POA: Diagnosis not present

## 2016-01-11 DIAGNOSIS — M25561 Pain in right knee: Secondary | ICD-10-CM

## 2016-01-11 NOTE — Therapy (Signed)
[facility] [address], [place], [place], [address] Phone: [phone]   Fax:  [phone]  Physical Therapy Treatment  Patient Details  Name: Katie Kaiser MRN: [id number] Date of Birth: [birth year] Referring Provider: Dr. Joni Fears  Encounter Date: 01/11/2016      PT End of Session - 01/11/16 0934    Visit Number 6   Number of Visits 10   Date for PT Re-Evaluation 02/21/16   Authorization Type medicare g-code 10th visit   PT Start Time 0930   PT Stop Time 1028   PT Time Calculation (min) 58 min   Activity Tolerance Patient tolerated treatment well   Behavior During Therapy [facility] for tasks assessed/performed      Past Medical History:  Diagnosis Date  . Arthritis   . Depression   . Diverticulosis   . Essential hypertension   . GERD (gastroesophageal reflux disease)   . Heart murmur    slight murmur per pt  . Hypothyroid   . Impaired fasting glucose 5/07  . Kidney stone   . OAB (overactive bladder)   . Obesity   . Pneumonia   . Vitamin D deficiency     Past Surgical History:  Procedure Laterality Date  . APPENDECTOMY    . CESAREAN SECTION    . COLONOSCOPY  02/04/2011   diverticulosis  . ORIF FOREARM FRACTURE  2002  . THYROIDECTOMY  1975  . TONSILLECTOMY    . TOTAL KNEE ARTHROPLASTY Right 11/27/2015   Procedure: TOTAL KNEE ARTHROPLASTY;  Surgeon: Garald Balding, MD;  Location: [place];  Service: Orthopedics;  Laterality: Right;    There were no vitals filed for this visit.      Subjective Assessment - 01/11/16 0931    Subjective Pt reports knee feeling good after last therapy session. Manual massage helped a lot with tightness   How long can you walk comfortably? right knee hurst, uses a cane   Patient Stated Goals stop limping   Currently in Pain? Yes   Pain Score 2    Pain Location Knee   Pain Orientation Right   Pain Descriptors / Indicators Tightness   Pain Type Surgical  pain   Pain Onset More than a month ago   Pain Frequency Constant   Multiple Pain Sites No                         [facility] Adult PT Treatment/Exercise - 01/11/16 0001      Knee/Hip Exercises: Aerobic   Stationary Bike L1 x 82mins     Knee/Hip Exercises: Seated   Long Arc Quad AAROM;Strengthening;3 sets;10 reps  #3   Ball Squeeze 3x10   Clamshell with TheraBand Red  3x10   Sit to [employer] 5 reps;3 sets     Knee/Hip Exercises: Supine   Quad Sets AROM;Strengthening;Right;1 set;20 reps   Terminal Knee Extension AROM;Strengthening;Right;2 sets;10 reps   Straight Leg Raises AROM;Strengthening     Modalities   Modalities Vasopneumatic     Vasopneumatic   Number Minutes Vasopneumatic  15 minutes   Vasopnuematic Location  Knee   Vasopneumatic Pressure Medium   Vasopneumatic Temperature  3 snow flakes     Manual Therapy   Manual Therapy Soft tissue mobilization   Soft tissue mobilization Elongation of hamstrings   Passive ROM stretching knee flexion and extension                  PT  Short Term Goals - 01/09/16 1453      PT SHORT TERM GOAL #1   Title independent with initial HEP   Time 4   Period Weeks   Status On-going     PT SHORT TERM GOAL #2   Title knee flexion PROM >/= 110   Time 4   Period Weeks   Status On-going     PT SHORT TERM GOAL #3   Title knee extension PROM >/= -5 degrees   Time 4   Period Weeks   Status On-going     PT SHORT TERM GOAL #4   Title pain with walking and standing decreased >/= 25%   Time 4   Period Weeks   Status On-going           PT Long Term Goals - 01/09/16 1454      PT LONG TERM GOAL #1   Title independent with HEP and understand how to progress herself at the gym   Time 8   Period Weeks   Status On-going     PT LONG TERM GOAL #2   Title walk without an assistive device  due to right hip strength 5/5 with minimal deficits   Time 8   Period Weeks   Status On-going     PT LONG TERM GOAL #3    Title go up and down steps with step over step pattern due to increased right knee strength >/= 4+/5   Time 8   Period Weeks   Status On-going     PT LONG TERM GOAL #4   Title right knee flexion AROM >/= 115 so it is easier to get in and out of the car   Time 8   Period Weeks   Status On-going     PT LONG TERM GOAL #5   Title stand at work for 4 hours with pain decreased >/= 75%   Time 8   Period Weeks   Status On-going     PT LONG TERM GOAL #6   Title FOTO score is </= 42%   Time 8   Period Weeks   Status On-going               Plan - 01/11/16 0957    Clinical Impression Statement Pt reports knee feeling better today. Able to do more stretching to hamstring and quads at home. Reports getting in and out of car is getting easier. Continue to increase knee strengthen and stability and begin progressing to more standing exercises.    Rehab Potential Excellent   PT Frequency 3x / week   PT Duration 8 weeks   PT Treatment/Interventions Cryotherapy;Electrical Stimulation;Functional mobility training;Stair training;Gait training;Ultrasound;Moist Heat;Therapeutic activities;Therapeutic exercise;Balance training;Neuromuscular re-education;Patient/family education;Passive range of motion;Scar mobilization;Manual techniques;Energy conservation;Vasopneumatic Device   PT Next Visit Plan Progress with standing exercises, soft tissue work and modalities as needed   PT Home Exercise Plan progress as needed   Consulted and Agree with Plan of Care Patient      Patient will benefit from skilled therapeutic intervention in order to improve the following deficits and impairments:  Abnormal gait, Pain, Decreased strength, Increased edema, Decreased scar mobility, Impaired flexibility, Decreased activity tolerance, Decreased endurance, Decreased coordination, Decreased range of motion, Increased fascial restricitons, Increased muscle spasms, Decreased mobility, Difficulty walking  Visit  Diagnosis: Muscle weakness (generalized)  Other abnormalities of gait and mobility  Pain in right knee  Stiffness of right knee, not elsewhere classified     Problem List Patient  Active Problem List   Diagnosis Date Noted  . Primary osteoarthritis of right knee 11/27/2015  . S/P total knee replacement using cement 11/27/2015  . Overactive bladder 11/15/2015  . Essential hypertension 04/17/2015  . Atherosclerosis of both carotid arteries 03/14/2015  . Advance care planning 08/21/2014  . Vitamin D deficiency 06/08/2013  . Depression, major, in remission ([place]) 06/08/2013  . Obesity, Class III, BMI 40-49.9 (morbid obesity) ([place]) 02/04/2011  . Hypothyroidism 10/21/2010    Mikle Bosworth PTA 01/11/2016, 10:20 AM  [facility] [address] [place], [place], [address] Phone: [phone]   Fax:  [phone]  Name: JOLANI GINDI MRN: [id number] Date of Birth: 06/06/1945

## 2016-01-15 ENCOUNTER — Encounter: Payer: Medicare Other | Admitting: Physical Therapy

## 2016-01-16 ENCOUNTER — Encounter: Payer: Self-pay | Admitting: Physical Therapy

## 2016-01-16 ENCOUNTER — Ambulatory Visit: Payer: Medicare Other | Admitting: Physical Therapy

## 2016-01-16 DIAGNOSIS — M25661 Stiffness of right knee, not elsewhere classified: Secondary | ICD-10-CM | POA: Diagnosis not present

## 2016-01-16 DIAGNOSIS — M25561 Pain in right knee: Secondary | ICD-10-CM | POA: Diagnosis not present

## 2016-01-16 DIAGNOSIS — R2689 Other abnormalities of gait and mobility: Secondary | ICD-10-CM | POA: Diagnosis not present

## 2016-01-16 DIAGNOSIS — M6281 Muscle weakness (generalized): Secondary | ICD-10-CM | POA: Diagnosis not present

## 2016-01-16 NOTE — Therapy (Signed)
[facility] [address], [place], [place], [address] Phone: [phone]   Fax:  [phone]  Physical Therapy Treatment  Patient Details  Name: Katie Kaiser MRN: [id number] Date of Birth: 03-Nov-1945 Referring Provider: Dr. Joni Fears  Encounter Date: 01/16/2016      PT End of Session - 01/16/16 1706    Visit Number 7   Number of Visits 10   Date for PT Re-Evaluation 02/21/16   Authorization Type medicare g-code 10th visit   PT Start Time 1615   PT Stop Time 1715   PT Time Calculation (min) 60 min   Activity Tolerance Patient tolerated treatment well   Behavior During Therapy [facility] for tasks assessed/performed      Past Medical History:  Diagnosis Date  . Arthritis   . Depression   . Diverticulosis   . Essential hypertension   . GERD (gastroesophageal reflux disease)   . Heart murmur    slight murmur per pt  . Hypothyroid   . Impaired fasting glucose 5/07  . Kidney stone   . OAB (overactive bladder)   . Obesity   . Pneumonia   . Vitamin D deficiency     Past Surgical History:  Procedure Laterality Date  . APPENDECTOMY    . CESAREAN SECTION    . COLONOSCOPY  02/04/2011   diverticulosis  . ORIF FOREARM FRACTURE  2002  . THYROIDECTOMY  1975  . TONSILLECTOMY    . TOTAL KNEE ARTHROPLASTY Right 11/27/2015   Procedure: TOTAL KNEE ARTHROPLASTY;  Surgeon: Garald Balding, MD;  Location: [place];  Service: Orthopedics;  Laterality: Right;    There were no vitals filed for this visit.      Subjective Assessment - 01/16/16 1618    Subjective Pt reports feeling well today.    How long can you walk comfortably? right knee hurst, uses a cane   Patient Stated Goals stop limping   Currently in Pain? Yes   Pain Score 1    Pain Location Knee   Pain Orientation Right   Pain Descriptors / Indicators Tightness   Pain Type Surgical pain   Pain Onset More than a month ago   Pain Frequency Constant                         [facility] Adult PT Treatment/Exercise - 01/16/16 0001      Knee/Hip Exercises: Stretches   Passive Hamstring Stretch Right;5 reps;10 seconds   Quad Stretch Right;5 reps;10 seconds     Knee/Hip Exercises: Aerobic   Stationary Bike L1 x 72mins     Knee/Hip Exercises: Standing   Forward Step Up Both;2 sets;10 reps   Rebounder 3 mins weight shifting     Knee/Hip Exercises: Seated   Long Arc Quad AAROM;Strengthening;3 sets;10 reps  #3   Ball Squeeze 3x10   Sit to [employer] 5 reps;3 sets     Knee/Hip Exercises: Supine   Quad Sets AROM;Strengthening;Right;1 set;20 reps   Terminal Knee Extension AROM;Strengthening;Right;2 sets;10 reps   Straight Leg Raises AROM;Strengthening     Modalities   Modalities Vasopneumatic     Vasopneumatic   Number Minutes Vasopneumatic  15 minutes   Vasopnuematic Location  Knee   Vasopneumatic Pressure Medium   Vasopneumatic Temperature  3 snow flakes                  PT Short Term Goals - 01/16/16 1619  PT SHORT TERM GOAL #1   Title independent with initial HEP   Time 4   Period Weeks   Status On-going     PT SHORT TERM GOAL #2   Title knee flexion PROM >/= 110   Time 4   Period Weeks   Status On-going     PT SHORT TERM GOAL #3   Title knee extension PROM >/= -5 degrees   Time 4   Period Weeks   Status On-going     PT SHORT TERM GOAL #4   Title pain with walking and standing decreased >/= 25%   Time 4   Period Weeks   Status On-going           PT Long Term Goals - 01/16/16 1619      PT LONG TERM GOAL #1   Title independent with HEP and understand how to progress herself at the gym   Time 8   Period Weeks   Status On-going     PT LONG TERM GOAL #2   Title walk without an assistive device  due to right hip strength 5/5 with minimal deficits   Time 8   Period Weeks   Status On-going     PT LONG TERM GOAL #3   Title go up and down steps with step over step pattern due to  increased right knee strength >/= 4+/5   Time 8   Period Weeks   Status On-going               Plan - 01/16/16 1654    Clinical Impression Statement Pt reports knee doing very well. Pt tolerated all standing exercises well. Will continue to increase standing and balance exercises.    Rehab Potential Excellent   PT Frequency 3x / week   PT Duration 8 weeks   PT Treatment/Interventions Cryotherapy;Electrical Stimulation;Functional mobility training;Stair training;Gait training;Ultrasound;Moist Heat;Therapeutic activities;Therapeutic exercise;Balance training;Neuromuscular re-education;Patient/family education;Passive range of motion;Scar mobilization;Manual techniques;Energy conservation;Vasopneumatic Device   PT Next Visit Plan Progress with standing exercises, try cybex leg press   PT Home Exercise Plan progress as needed   Consulted and Agree with Plan of Care Patient      Patient will benefit from skilled therapeutic intervention in order to improve the following deficits and impairments:  Abnormal gait, Pain, Decreased strength, Increased edema, Decreased scar mobility, Impaired flexibility, Decreased activity tolerance, Decreased endurance, Decreased coordination, Decreased range of motion, Increased fascial restricitons, Increased muscle spasms, Decreased mobility, Difficulty walking  Visit Diagnosis: Muscle weakness (generalized)  Other abnormalities of gait and mobility  Pain in right knee  Stiffness of right knee, not elsewhere classified     Problem List Patient Active Problem List   Diagnosis Date Noted  . Primary osteoarthritis of right knee 11/27/2015  . S/P total knee replacement using cement 11/27/2015  . Overactive bladder 11/15/2015  . Essential hypertension 04/17/2015  . Atherosclerosis of both carotid arteries 03/14/2015  . Advance care planning 08/21/2014  . Vitamin D deficiency 06/08/2013  . Depression, major, in remission ([place]) 06/08/2013  .  Obesity, Class III, BMI 40-49.9 (morbid obesity) ([place]) 02/04/2011  . Hypothyroidism 10/21/2010    Mikle Bosworth PTA 01/16/2016, 5:07 PM  [facility] [address], [place], [place], [address] Phone: [phone]   Fax:  [phone]  Name: Katie Kaiser MRN: [id number] Date of Birth: Nov 07, 1945

## 2016-01-18 ENCOUNTER — Ambulatory Visit: Payer: Medicare Other | Admitting: Physical Therapy

## 2016-01-18 ENCOUNTER — Encounter: Payer: Self-pay | Admitting: Physical Therapy

## 2016-01-18 DIAGNOSIS — M6281 Muscle weakness (generalized): Secondary | ICD-10-CM | POA: Diagnosis not present

## 2016-01-18 DIAGNOSIS — M25661 Stiffness of right knee, not elsewhere classified: Secondary | ICD-10-CM | POA: Diagnosis not present

## 2016-01-18 DIAGNOSIS — R2689 Other abnormalities of gait and mobility: Secondary | ICD-10-CM

## 2016-01-18 DIAGNOSIS — M25561 Pain in right knee: Secondary | ICD-10-CM

## 2016-01-18 NOTE — Therapy (Signed)
[facility] [address], [place], [place], [address] Phone: [phone]   Fax:  [phone]  Physical Therapy Treatment  Patient Details  Name: Katie Kaiser MRN: [id number] Date of Birth: Aug 26, 1945 Referring Provider: Dr. Joni Fears  Encounter Date: 01/18/2016      PT End of Session - 01/18/16 1117    Visit Number 8   Number of Visits 10   Date for PT Re-Evaluation 02/21/16   Authorization Type medicare g-code 10th visit   PT Start Time 1105   PT Stop Time 1157   PT Time Calculation (min) 52 min   Activity Tolerance Patient tolerated treatment well   Behavior During Therapy [facility] for tasks assessed/performed      Past Medical History:  Diagnosis Date  . Arthritis   . Depression   . Diverticulosis   . Essential hypertension   . GERD (gastroesophageal reflux disease)   . Heart murmur    slight murmur per pt  . Hypothyroid   . Impaired fasting glucose 5/07  . Kidney stone   . OAB (overactive bladder)   . Obesity   . Pneumonia   . Vitamin D deficiency     Past Surgical History:  Procedure Laterality Date  . APPENDECTOMY    . CESAREAN SECTION    . COLONOSCOPY  02/04/2011   diverticulosis  . ORIF FOREARM FRACTURE  2002  . THYROIDECTOMY  1975  . TONSILLECTOMY    . TOTAL KNEE ARTHROPLASTY Right 11/27/2015   Procedure: TOTAL KNEE ARTHROPLASTY;  Surgeon: Garald Balding, MD;  Location: [place];  Service: Orthopedics;  Laterality: Right;    There were no vitals filed for this visit.      Subjective Assessment - 01/18/16 1106    Subjective Pt reports feeling very good today. Is very positive about progress.    How long can you walk comfortably? right knee hurst, uses a cane   Patient Stated Goals stop limping   Currently in Pain? No/denies                         [facility] Adult PT Treatment/Exercise - 01/18/16 0001      Knee/Hip Exercises: Stretches   Passive Hamstring  Stretch Right;5 reps;10 seconds     Knee/Hip Exercises: Aerobic   Stationary Bike L1 x 24mins     Knee/Hip Exercises: Machines for Strengthening   Cybex Knee Extension 3x10  20#   Cybex Knee Flexion 3x10  25#   Cybex Leg Press 3x10  Seat 6  90#     Knee/Hip Exercises: Standing   Hip Abduction Stengthening;Both;2 sets;10 reps   Hip Extension Stengthening;Both;2 sets;10 reps   Forward Step Up Both;2 sets;10 reps   Rebounder 3 mins weight shifting     Knee/Hip Exercises: Seated   Long Arc Quad AAROM;Strengthening;3 sets;10 reps  #3   Sit to [employer] 5 reps;3 sets     Knee/Hip Exercises: Supine   Terminal Knee Extension AROM;Strengthening;Right;2 sets;10 reps   Straight Leg Raises AROM;Strengthening;2 sets     Modalities   Modalities Vasopneumatic     Vasopneumatic   Number Minutes Vasopneumatic  15 minutes   Vasopnuematic Location  Knee   Vasopneumatic Pressure Medium   Vasopneumatic Temperature  3 snow flakes                  PT Short Term Goals - 01/16/16 1619      PT SHORT TERM GOAL #  1   Title independent with initial HEP   Time 4   Period Weeks   Status On-going     PT SHORT TERM GOAL #2   Title knee flexion PROM >/= 110   Time 4   Period Weeks   Status On-going     PT SHORT TERM GOAL #3   Title knee extension PROM >/= -5 degrees   Time 4   Period Weeks   Status On-going     PT SHORT TERM GOAL #4   Title pain with walking and standing decreased >/= 25%   Time 4   Period Weeks   Status On-going           PT Long Term Goals - 01/16/16 1619      PT LONG TERM GOAL #1   Title independent with HEP and understand how to progress herself at the gym   Time 8   Period Weeks   Status On-going     PT LONG TERM GOAL #2   Title walk without an assistive device  due to right hip strength 5/5 with minimal deficits   Time 8   Period Weeks   Status On-going     PT LONG TERM GOAL #3   Title go up and down steps with step over step pattern due to  increased right knee strength >/= 4+/5   Time 8   Period Weeks   Status On-going               Plan - 01/18/16 1147    Clinical Impression Statement Pt reports feeling very good about progress. Reports no pain today. Able to tolerate more standing strengthening exercsies. Pt is very motivated to progress but also needs encouragment to pace herself and rest as to not over do it.    Rehab Potential Excellent   PT Frequency 3x / week   PT Duration 8 weeks   PT Treatment/Interventions Cryotherapy;Electrical Stimulation;Functional mobility training;Stair training;Gait training;Ultrasound;Moist Heat;Therapeutic activities;Therapeutic exercise;Balance training;Neuromuscular re-education;Patient/family education;Passive range of motion;Scar mobilization;Manual techniques;Energy conservation;Vasopneumatic Device   PT Next Visit Plan Progress with standing exercises, and machines. Try ladder   PT Home Exercise Plan progress as needed   Consulted and Agree with Plan of Care Patient      Patient will benefit from skilled therapeutic intervention in order to improve the following deficits and impairments:  Abnormal gait, Pain, Decreased strength, Increased edema, Decreased scar mobility, Impaired flexibility, Decreased activity tolerance, Decreased endurance, Decreased coordination, Decreased range of motion, Increased fascial restricitons, Increased muscle spasms, Decreased mobility, Difficulty walking  Visit Diagnosis: Muscle weakness (generalized)  Other abnormalities of gait and mobility  Pain in right knee  Stiffness of right knee, not elsewhere classified     Problem List Patient Active Problem List   Diagnosis Date Noted  . Primary osteoarthritis of right knee 11/27/2015  . S/P total knee replacement using cement 11/27/2015  . Overactive bladder 11/15/2015  . Essential hypertension 04/17/2015  . Atherosclerosis of both carotid arteries 03/14/2015  . Advance care planning  08/21/2014  . Vitamin D deficiency 06/08/2013  . Depression, major, in remission ([place]) 06/08/2013  . Obesity, Class III, BMI 40-49.9 (morbid obesity) ([place]) 02/04/2011  . Hypothyroidism 10/21/2010    Mikle Bosworth PTA 01/18/2016, 11:48 AM  [facility] [address], [place], [place], [address] Phone: [phone]   Fax:  [phone]  Name: ROXANA LUKE MRN: [id number] Date of Birth: December 29, 1945

## 2016-01-21 ENCOUNTER — Encounter: Payer: Self-pay | Admitting: Physical Therapy

## 2016-01-21 ENCOUNTER — Ambulatory Visit: Payer: Medicare Other | Admitting: Physical Therapy

## 2016-01-21 DIAGNOSIS — M25561 Pain in right knee: Secondary | ICD-10-CM

## 2016-01-21 DIAGNOSIS — M6281 Muscle weakness (generalized): Secondary | ICD-10-CM

## 2016-01-21 DIAGNOSIS — R2689 Other abnormalities of gait and mobility: Secondary | ICD-10-CM

## 2016-01-21 DIAGNOSIS — M25661 Stiffness of right knee, not elsewhere classified: Secondary | ICD-10-CM | POA: Diagnosis not present

## 2016-01-21 NOTE — Therapy (Signed)
[facility] [address], [place], [place], [address] Phone: [phone]   Fax:  [phone]  Physical Therapy Treatment  Patient Details  Name: Katie Kaiser MRN: [id number] Date of Birth: [birth year] Referring Provider: Dr. Joni Fears  Encounter Date: 01/21/2016      PT End of Session - 01/21/16 1508    Visit Number 9   Number of Visits 10   Date for PT Re-Evaluation 02/21/16   Authorization Type medicare g-code 10th visit   PT Start Time 2355   PT Stop Time 1546   PT Time Calculation (min) 61 min   Activity Tolerance Patient tolerated treatment well   Behavior During Therapy [facility] for tasks assessed/performed      Past Medical History:  Diagnosis Date  . Arthritis   . Depression   . Diverticulosis   . Essential hypertension   . GERD (gastroesophageal reflux disease)   . Heart murmur    slight murmur per pt  . Hypothyroid   . Impaired fasting glucose 5/07  . Kidney stone   . OAB (overactive bladder)   . Obesity   . Pneumonia   . Vitamin D deficiency     Past Surgical History:  Procedure Laterality Date  . APPENDECTOMY    . CESAREAN SECTION    . COLONOSCOPY  02/04/2011   diverticulosis  . ORIF FOREARM FRACTURE  2002  . THYROIDECTOMY  1975  . TONSILLECTOMY    . TOTAL KNEE ARTHROPLASTY Right 11/27/2015   Procedure: TOTAL KNEE ARTHROPLASTY;  Surgeon: Garald Balding, MD;  Location: [place];  Service: Orthopedics;  Laterality: Right;    There were no vitals filed for this visit.      Subjective Assessment - 01/21/16 1449    Subjective Pt reports feeling well. Pt continues to difficulty walking long distances due to fatigue, pain is not too bad.    How long can you walk comfortably? right knee hurst, uses a cane   Patient Stated Goals stop limping   Currently in Pain? Yes   Pain Score 1    Pain Location Knee   Pain Orientation Right   Pain Descriptors / Indicators Tightness   Pain  Type Surgical pain            [facility] PT Assessment - 01/21/16 0001      PROM   PROM Assessment Site Knee   Right/Left Knee Right   Right Knee Extension -8  measured in supine   Right Knee Flexion 111  measured in supine                     [facility] Adult PT Treatment/Exercise - 01/21/16 0001      Knee/Hip Exercises: Stretches   Passive Hamstring Stretch Right;5 reps;10 seconds     Knee/Hip Exercises: Aerobic   Stationary Bike L1 x 35mns     Knee/Hip Exercises: Machines for Strengthening   Cybex Knee Extension 3x10  20#   Cybex Knee Flexion 3x10  25#   Cybex Leg Press 3x10  Seat 6  90#     Knee/Hip Exercises: Standing   Hip Abduction Stengthening;Both;2 sets;10 reps   Hip Extension Stengthening;Both;2 sets;10 reps   Forward Step Up --   Rebounder 3 mins weight shifting     Knee/Hip Exercises: Seated   Long Arc Quad --  #3     Knee/Hip Exercises: Supine   Terminal Knee Extension AROM;Strengthening;Right;2 sets;10 reps     Modalities  Modalities Vasopneumatic     Vasopneumatic   Number Minutes Vasopneumatic  15 minutes   Vasopnuematic Location  Knee   Vasopneumatic Pressure Medium   Vasopneumatic Temperature  3 snowflakes     Manual Therapy   Manual Therapy Soft tissue mobilization   Manual therapy comments To Rt knee  10 minutes with lotion   Soft tissue mobilization Scar mobilization   Passive ROM ROM in flexion and extension                  PT Short Term Goals - 01/21/16 1451      PT SHORT TERM GOAL #1   Title independent with initial HEP   Time 4   Period Weeks   Status Achieved     PT SHORT TERM GOAL #2   Title knee flexion PROM >/= 110   Time 4   Period Weeks   Status On-going     PT SHORT TERM GOAL #3   Title knee extension PROM >/= -5 degrees   Time 4   Period Weeks   Status On-going     PT SHORT TERM GOAL #4   Title pain with walking and standing decreased >/= 25%   Time 4   Period Weeks   Status  On-going           PT Long Term Goals - 01/21/16 1452      PT LONG TERM GOAL #1   Title independent with HEP and understand how to progress herself at the gym   Time 8   Period Weeks   Status On-going     PT LONG TERM GOAL #2   Title walk without an assistive device  due to right hip strength 5/5 with minimal deficits   Time 8   Period Weeks   Status Achieved     PT LONG TERM GOAL #3   Title go up and down steps with step over step pattern due to increased right knee strength >/= 4+/5   Time 8   Period Weeks   Status Partially Met  able to do step over pattern but does not due to fear     PT LONG TERM GOAL #4   Title right knee flexion AROM >/= 115 so it is easier to get in and out of the car   Time 8   Period Weeks   Status On-going     PT LONG TERM GOAL #5   Title stand at work for 4 hours with pain decreased >/= 75%  Pt begining to stand more at work   Time 8   Period Weeks   Status On-going     PT LONG TERM GOAL #6   Title FOTO score is </= 42%   Time 8   Period Weeks   Status On-going             Patient will benefit from skilled therapeutic intervention in order to improve the following deficits and impairments:     Visit Diagnosis: Muscle weakness (generalized)  Other abnormalities of gait and mobility  Pain in right knee  Stiffness of right knee, not elsewhere classified     Problem List Patient Active Problem List   Diagnosis Date Noted  . Primary osteoarthritis of right knee 11/27/2015  . S/P total knee replacement using cement 11/27/2015  . Overactive bladder 11/15/2015  . Essential hypertension 04/17/2015  . Atherosclerosis of both carotid arteries 03/14/2015  . Advance care planning 08/21/2014  . Vitamin D  deficiency 06/08/2013  . Depression, major, in remission ([place]) 06/08/2013  . Obesity, Class III, BMI 40-49.9 (morbid obesity) ([place]) 02/04/2011  . Hypothyroidism 10/21/2010    Mikle Bosworth PTA 01/21/2016, 3:40  PM  [facility] [address] [place], [place], [address] Phone: [phone]   Fax:  [phone]  Name: ELLYSE ROTOLO MRN: [id number] Date of Birth: 1946-04-28

## 2016-01-23 ENCOUNTER — Ambulatory Visit: Payer: Medicare Other

## 2016-01-23 DIAGNOSIS — R2689 Other abnormalities of gait and mobility: Secondary | ICD-10-CM

## 2016-01-23 DIAGNOSIS — M25661 Stiffness of right knee, not elsewhere classified: Secondary | ICD-10-CM

## 2016-01-23 DIAGNOSIS — M25561 Pain in right knee: Secondary | ICD-10-CM

## 2016-01-23 DIAGNOSIS — M6281 Muscle weakness (generalized): Secondary | ICD-10-CM | POA: Diagnosis not present

## 2016-01-23 NOTE — Therapy (Signed)
[facility] [address] [place], [place], [address] Phone: [phone]   Fax:  [phone]  Physical Therapy Treatment  Patient Details  Name: Katie Kaiser MRN: [id number] Date of Birth: 08/19/1945 Referring Provider: Dr. Joni Fears  Encounter Date: 01/23/2016      PT End of Session - 01/23/16 1608    Visit Number 10   Number of Visits 20   Date for PT Re-Evaluation 02/21/16   Authorization Type medicare g-code 10th visit   PT Start Time 1525   PT Stop Time 1624   PT Time Calculation (min) 59 min   Activity Tolerance Patient tolerated treatment well   Behavior During Therapy [facility] for tasks assessed/performed      Past Medical History:  Diagnosis Date  . Arthritis   . Depression   . Diverticulosis   . Essential hypertension   . GERD (gastroesophageal reflux disease)   . Heart murmur    slight murmur per pt  . Hypothyroid   . Impaired fasting glucose 5/07  . Kidney stone   . OAB (overactive bladder)   . Obesity   . Pneumonia   . Vitamin D deficiency     Past Surgical History:  Procedure Laterality Date  . APPENDECTOMY    . CESAREAN SECTION    . COLONOSCOPY  02/04/2011   diverticulosis  . ORIF FOREARM FRACTURE  2002  . THYROIDECTOMY  1975  . TONSILLECTOMY    . TOTAL KNEE ARTHROPLASTY Right 11/27/2015   Procedure: TOTAL KNEE ARTHROPLASTY;  Surgeon: Garald Balding, MD;  Location: [place];  Service: Orthopedics;  Laterality: Right;    There were no vitals filed for this visit.      Subjective Assessment - 01/23/16 1532    Subjective Pt reports that PT has really helped her.     Currently in Pain? Yes   Pain Score 1    Pain Location Knee   Pain Orientation Right   Pain Type Surgical pain   Pain Onset More than a month ago   Pain Frequency Constant            [facility] PT Assessment - 01/23/16 0001      Assessment   Medical Diagnosis Z96.659 A/P Total knee replacement     Observation/Other Assessments   Focus on Therapeutic Outcomes (FOTO)  29% limitation                     [facility] Adult PT Treatment/Exercise - 01/23/16 0001      Knee/Hip Exercises: Stretches   Active Hamstring Stretch Both;3 reps;20 seconds     Knee/Hip Exercises: Aerobic   Stationary Bike L 2 x 69mns     Knee/Hip Exercises: Machines for Strengthening   Cybex Knee Extension 3x10  20#   Cybex Knee Flexion 3x10  25#   Cybex Leg Press 3x10  Seat 6  90#     Knee/Hip Exercises: Standing   Rocker Board 3 minutes   Rebounder 3 mins weight shifting   Walking with Sports Cord 20# forward and reverse x 10 each     Knee/Hip Exercises: Seated   Long Arc Quad AAROM;Strengthening;3 sets;10 reps  #3   Long Arc Quad Weight 3 lbs.     Modalities   Modalities Vasopneumatic     Vasopneumatic   Number Minutes Vasopneumatic  15 minutes   Vasopnuematic Location  Knee   Vasopneumatic Pressure Medium   Vasopneumatic Temperature  3 snowflakes  PT Short Term Goals - 01/21/16 1451      PT SHORT TERM GOAL #1   Title independent with initial HEP   Time 4   Period Weeks   Status Achieved     PT SHORT TERM GOAL #2   Title knee flexion PROM >/= 110   Time 4   Period Weeks   Status On-going     PT SHORT TERM GOAL #3   Title knee extension PROM >/= -5 degrees   Time 4   Period Weeks   Status On-going     PT SHORT TERM GOAL #4   Title pain with walking and standing decreased >/= 25%   Time 4   Period Weeks   Status On-going           PT Long Term Goals - 01/21/16 1452      PT LONG TERM GOAL #1   Title independent with HEP and understand how to progress herself at the gym   Time 8   Period Weeks   Status On-going     PT LONG TERM GOAL #2   Title walk without an assistive device  due to right hip strength 5/5 with minimal deficits   Time 8   Period Weeks   Status Achieved     PT LONG TERM GOAL #3   Title go up and down steps with  step over step pattern due to increased right knee strength >/= 4+/5   Time 8   Period Weeks   Status Partially Met  able to do step over pattern but does not due to fear     PT LONG TERM GOAL #4   Title right knee flexion AROM >/= 115 so it is easier to get in and out of the car   Time 8   Period Weeks   Status On-going     PT LONG TERM GOAL #5   Title stand at work for 4 hours with pain decreased >/= 75%  Pt begining to stand more at work   Time 8   Period Weeks   Status On-going     PT LONG TERM GOAL #6   Title FOTO score is </= 42%   Time 8   Period Weeks   Status On-going               Plan - 01/23/16 1543    Clinical Impression Statement Pt is making steady progress s/p joint replacement surgery.  FOTO score is improved to 29% limitation.  Pt with Rt knee weakness, stiffness and edema expected after surgery.  Pt plans to transition to the gym after seeing MD next week.     Rehab Potential Excellent   PT Frequency 3x / week   PT Duration 8 weeks   PT Treatment/Interventions Cryotherapy;Electrical Stimulation;Functional mobility training;Stair training;Gait training;Ultrasound;Moist Heat;Therapeutic activities;Therapeutic exercise;Balance training;Neuromuscular re-education;Patient/family education;Passive range of motion;Scar mobilization;Manual techniques;Energy conservation;Vasopneumatic Device   PT Next Visit Plan Take measurements and route note to MD as pt has appt on Tuesday. Progress with standing exercises, and machines. Finalize gym exercises with probable discharge at the end of next week to gym program.        Patient will benefit from skilled therapeutic intervention in order to improve the following deficits and impairments:  Abnormal gait, Pain, Decreased strength, Increased edema, Decreased scar mobility, Impaired flexibility, Decreased activity tolerance, Decreased endurance, Decreased coordination, Decreased range of motion, Increased fascial  restricitons, Increased muscle spasms, Decreased mobility, Difficulty walking  Visit  Diagnosis: Muscle weakness (generalized)  Other abnormalities of gait and mobility  Stiffness of right knee, not elsewhere classified  Pain in right knee       G-Codes - 02/11/2016 1535    Functional Assessment Tool Used FOTO: 29% limitation   Functional Limitation Mobility: Walking and moving around   Mobility: Walking and Moving Around Current Status [phone]) At least 20 percent but less than 40 percent impaired, limited or restricted   Mobility: Walking and Moving Around Goal Status [phone]) At least 20 percent but less than 40 percent impaired, limited or restricted      Problem List Patient Active Problem List   Diagnosis Date Noted  . Primary osteoarthritis of right knee 11/27/2015  . S/P total knee replacement using cement 11/27/2015  . Overactive bladder 11/15/2015  . Essential hypertension 04/17/2015  . Atherosclerosis of both carotid arteries 03/14/2015  . Advance care planning 08/21/2014  . Vitamin D deficiency 06/08/2013  . Depression, major, in remission ([place]) 06/08/2013  . Obesity, Class III, BMI 40-49.9 (morbid obesity) ([place]) 02/04/2011  . Hypothyroidism 10/21/2010     Sigurd Sos, PT 02-11-2016 4:10 PM  [facility] [address] [place], [place], [address] Phone: [phone]   Fax:  [phone]  Name: Katie Kaiser MRN: [id number] Date of Birth: 08-Aug-1945

## 2016-01-25 ENCOUNTER — Ambulatory Visit: Payer: Medicare Other | Admitting: Physical Therapy

## 2016-01-25 ENCOUNTER — Encounter: Payer: Self-pay | Admitting: Physical Therapy

## 2016-01-25 ENCOUNTER — Ambulatory Visit: Payer: Medicare Other | Admitting: Medical

## 2016-01-25 DIAGNOSIS — M25661 Stiffness of right knee, not elsewhere classified: Secondary | ICD-10-CM

## 2016-01-25 DIAGNOSIS — M6281 Muscle weakness (generalized): Secondary | ICD-10-CM | POA: Diagnosis not present

## 2016-01-25 DIAGNOSIS — M25561 Pain in right knee: Secondary | ICD-10-CM | POA: Diagnosis not present

## 2016-01-25 DIAGNOSIS — R2689 Other abnormalities of gait and mobility: Secondary | ICD-10-CM

## 2016-01-25 NOTE — Therapy (Addendum)
[facility] [address], [place], [place], [address] Phone: [phone]   Fax:  [phone]  Physical Therapy Treatment  Patient Details  Name: Katie Kaiser MRN: [id number] Date of Birth: April 22, 1946 Referring Provider: D. Joni Fears  Encounter Date: 01/25/2016      PT End of Session - 01/25/16 1103    Visit Number 11   Number of Visits 20   Date for PT Re-Evaluation 02/21/16   Authorization Type medicare g-code 10th visit   PT Start Time 1100   PT Stop Time 1153   PT Time Calculation (min) 53 min   Activity Tolerance Patient tolerated treatment well   Behavior During Therapy [facility] for tasks assessed/performed      Past Medical History:  Diagnosis Date  . Arthritis   . Depression   . Diverticulosis   . Essential hypertension   . GERD (gastroesophageal reflux disease)   . Heart murmur    slight murmur per pt  . Hypothyroid   . Impaired fasting glucose 5/07  . Kidney stone   . OAB (overactive bladder)   . Obesity   . Pneumonia   . Vitamin D deficiency     Past Surgical History:  Procedure Laterality Date  . APPENDECTOMY    . CESAREAN SECTION    . COLONOSCOPY  02/04/2011   diverticulosis  . ORIF FOREARM FRACTURE  2002  . THYROIDECTOMY  1975  . TONSILLECTOMY    . TOTAL KNEE ARTHROPLASTY Right 11/27/2015   Procedure: TOTAL KNEE ARTHROPLASTY;  Surgeon: Garald Balding, MD;  Location: [place];  Service: Orthopedics;  Laterality: Right;    There were no vitals filed for this visit.      Subjective Assessment - 01/25/16 1102    Subjective Pt reports no pain today. Has been trying to walk more normally but is so used to walking with a limp and bracing for pain. Pt stated she wants to get legs stronger so that she can walk and stand longer.    Patient Stated Goals stop limping   Currently in Pain? No/denies            [facility] PT Assessment - 01/25/16 0001      Assessment   Medical  Diagnosis Z96.659 A/P Total knee replacement   Referring Provider D. Joni Fears   Onset Date/Surgical Date 11/27/15     Observation/Other Assessments   Focus on Therapeutic Outcomes (FOTO)  29% limitation     Observation/Other Assessments-Edema    Edema Circumferential     AROM   AROM Assessment Site Knee   Right/Left Knee Right   Right Knee Extension -8   Right Knee Flexion 118  in supine     PROM   PROM Assessment Site Knee   Right/Left Knee Right   Right Knee Extension -5   Right Knee Flexion 120  in supine                     [facility] Adult PT Treatment/Exercise - 01/25/16 0001      Knee/Hip Exercises: Stretches   Active Hamstring Stretch Both;3 reps;20 seconds     Knee/Hip Exercises: Aerobic   Stationary Bike L 2 x 31mns     Knee/Hip Exercises: Machines for Strengthening   Cybex Knee Extension 3x10  20#   Cybex Knee Flexion 3x10  25#   Cybex Leg Press 3x10  Seat 6  90#     Knee/Hip Exercises: Standing   SLS  blue mat 30 secs x3   Walking with Sports Cord 20# forward and reverse x 10 each     Knee/Hip Exercises: Seated   Sit to Sand 20 reps;without UE support                  PT Short Term Goals - 01/25/16 1135      PT SHORT TERM GOAL #1   Title independent with initial HEP   Time 4   Period Weeks   Status Achieved     PT SHORT TERM GOAL #2   Title knee flexion PROM >/= 110   Time 4   Period Weeks   Status Achieved     PT SHORT TERM GOAL #3   Title knee extension PROM >/= -5 degrees   Time 4   Period Weeks   Status Achieved     PT SHORT TERM GOAL #4   Title pain with walking and standing decreased >/= 25%   Time 4   Period Weeks   Status Achieved  No pain just fatigue           PT Long Term Goals - 01/25/16 1136      PT LONG TERM GOAL #2   Title walk without an assistive device  due to right hip strength 5/5 with minimal deficits   Time 8   Period Weeks   Status Achieved     PT LONG TERM GOAL #3    Title go up and down steps with step over step pattern due to increased right knee strength >/= 4+/5   Time 8   Period Weeks   Status Partially Met     PT LONG TERM GOAL #6   Title FOTO score is </= 42%   Time 8   Period Weeks   Status Achieved               Plan - 01/25/16 1116    Clinical Impression Statement Pt continueing to progress with strenth, balance, and stability. Continues to have difficulty with single leg stance due to weakness. Pt has met goals for knee range of motion. Pt will continue to benefit from regular exercise rutine to increase LE strength and endurance for daily activities.    Rehab Potential Excellent   PT Frequency 3x / week   PT Duration 8 weeks   PT Treatment/Interventions Cryotherapy;Electrical Stimulation;Functional mobility training;Stair training;Gait training;Ultrasound;Moist Heat;Therapeutic activities;Therapeutic exercise;Balance training;Neuromuscular re-education;Patient/family education;Passive range of motion;Scar mobilization;Manual techniques;Energy conservation;Vasopneumatic Device   PT Next Visit Plan practice stairs   Consulted and Agree with Plan of Care Patient      Patient will benefit from skilled therapeutic intervention in order to improve the following deficits and impairments:  Abnormal gait, Pain, Decreased strength, Increased edema, Decreased scar mobility, Impaired flexibility, Decreased activity tolerance, Decreased endurance, Decreased coordination, Decreased range of motion, Increased fascial restricitons, Increased muscle spasms, Decreased mobility, Difficulty walking  Visit Diagnosis: Muscle weakness (generalized)  Other abnormalities of gait and mobility  Stiffness of right knee, not elsewhere classified  Pain in right knee     Problem List Patient Active Problem List   Diagnosis Date Noted  . Primary osteoarthritis of right knee 11/27/2015  . S/P total knee replacement using cement 11/27/2015  .  Overactive bladder 11/15/2015  . Essential hypertension 04/17/2015  . Atherosclerosis of both carotid arteries 03/14/2015  . Advance care planning 08/21/2014  . Vitamin D deficiency 06/08/2013  . Depression, major, in remission ([place]) 06/08/2013  . Obesity, Class  III, BMI 40-49.9 (morbid obesity) ([place]) 02/04/2011  . Hypothyroidism 10/21/2010    Mikle Bosworth PTA 01/25/2016, 11:44 AM PHYSICAL THERAPY DISCHARGE SUMMARY  G-codes: Mobility Category Goal Status: CJ D/C status: CJ Visits from Start of Care: 11  Current functional level related to goals / functional outcomes: See above for current status.  Pt called to cancel all remaining appointments as MD has released her.     Remaining deficits: See above for current status.     Education / Equipment: HEP Plan: Patient agrees to discharge.  Patient goals were partially met. Patient is being discharged due to being pleased with the current functional level.  ?????        Sigurd Sos, PT 02/06/16 1:57 PM   [facility] [address], [place], [place], [address] Phone: [phone]   Fax:  [phone]  Name: Katie Kaiser MRN: [id number] Date of Birth: 1945/12/12

## 2016-01-29 ENCOUNTER — Encounter: Payer: Medicare Other | Admitting: Physical Therapy

## 2016-01-30 ENCOUNTER — Encounter: Payer: Medicare Other | Admitting: Physical Therapy

## 2016-02-01 ENCOUNTER — Ambulatory Visit: Payer: Self-pay | Admitting: Physical Therapy

## 2016-02-04 ENCOUNTER — Encounter: Payer: Medicare Other | Admitting: Physical Therapy

## 2016-02-06 ENCOUNTER — Ambulatory Visit: Payer: Medicare Other

## 2016-02-08 ENCOUNTER — Encounter: Payer: Medicare Other | Admitting: Physical Therapy

## 2016-03-12 ENCOUNTER — Ambulatory Visit (INDEPENDENT_AMBULATORY_CARE_PROVIDER_SITE_OTHER): Payer: Medicare Other | Admitting: Orthopedic Surgery

## 2016-03-18 ENCOUNTER — Ambulatory Visit: Payer: Medicare Other | Admitting: Family Medicine

## 2016-03-19 ENCOUNTER — Ambulatory Visit (INDEPENDENT_AMBULATORY_CARE_PROVIDER_SITE_OTHER): Payer: Medicare Other | Admitting: Orthopedic Surgery

## 2016-03-19 ENCOUNTER — Ambulatory Visit (INDEPENDENT_AMBULATORY_CARE_PROVIDER_SITE_OTHER): Payer: Medicare Other | Admitting: Family Medicine

## 2016-03-19 ENCOUNTER — Encounter: Payer: Self-pay | Admitting: Family Medicine

## 2016-03-19 VITALS — BP 136/80 | HR 72 | Temp 99.5°F | Ht 65.0 in | Wt 234.8 lb

## 2016-03-19 DIAGNOSIS — Z23 Encounter for immunization: Secondary | ICD-10-CM

## 2016-03-19 DIAGNOSIS — M25562 Pain in left knee: Secondary | ICD-10-CM | POA: Diagnosis not present

## 2016-03-19 DIAGNOSIS — G8929 Other chronic pain: Secondary | ICD-10-CM

## 2016-03-19 DIAGNOSIS — M1712 Unilateral primary osteoarthritis, left knee: Secondary | ICD-10-CM | POA: Diagnosis not present

## 2016-03-19 DIAGNOSIS — H9201 Otalgia, right ear: Secondary | ICD-10-CM

## 2016-03-19 MED ORDER — LIDOCAINE HCL 1 % IJ SOLN
2.0000 mL | INTRAMUSCULAR | Status: AC | PRN
  Administered 2016-03-19: 2 mL

## 2016-03-19 MED ORDER — SODIUM HYALURONATE (VISCOSUP) 20 MG/2ML IX SOSY
20.0000 mg | PREFILLED_SYRINGE | INTRA_ARTICULAR | Status: AC | PRN
  Administered 2016-03-19: 20 mg via INTRA_ARTICULAR

## 2016-03-19 NOTE — Patient Instructions (Signed)
   There is no evidence of ear infection at this time.  There likely is some pressure changes affected the ear drum that can lead to fluid accumulation which potentially can lead to infection.  Let us know if you develop higher fever or worsening pain.  If you have significant hearing loss in that ear develop, please return for recheck. It is normal to have some intermittent plugging or popping with the current condition. You may use aleve as needed for the pain.  You can try using claritin or zyrtec (avoid the D versions) to see if that helps with the allergies/congestion and ear discomfort.

## 2016-03-19 NOTE — Progress Notes (Signed)
Office Visit Note   Patient: Katie Kaiser           Date of Birth: 1946/04/27           MRN: [id number] Visit Date: 03/19/2016              Requested by: Rita Ohara, MD [address] [place], [place] [address] PCP: Vikki Ports, MD   Assessment & Plan: Osteoarthritis left knee. We had discussed Visco supplementation in the past and she wishes to proceed. She has been precertified. Visit Diagnoses: No diagnosis found.  Plan:f/u 1 week for repeat euflexxa   Follow-Up Instructions: No Follow-up on file.   Orders:  No orders of the defined types were placed in this encounter.  No orders of the defined types were placed in this encounter.     Procedures: Large Joint Inj Date/Time: 03/19/2016 3:45 PM Performed by: Biagio Borg D Authorized by: Biagio Borg D   Consent Given by:  Patient Timeout: prior to procedure the correct patient, procedure, and site was verified   Indications:  Pain and joint swelling Location:  Knee Site:  L knee Prep: patient was prepped and draped in usual sterile fashion   Needle Size:  25 G Needle Length:  1.5 inches Approach:  Anteromedial Ultrasound Guidance: No   Fluoroscopic Guidance: No   Arthrogram: No   Medications:  20 mg Sodium Hyaluronate 20 MG/2ML; 2 mL lidocaine 1 % Aspiration Attempted: No   Patient tolerance:  Patient tolerated the procedure well with no immediate complications      Clinical Data: No additional findings.   Subjective: No chief complaint on file.   HPI  Review of Systems   Objective: Vital Signs: LMP 05/05/1990   Physical Exam  Ortho Exam knee was not hot red or swollen. Did not appear to be an effusion.  Specialty Comments:  No specialty comments available.  Imaging: No results found.   PMFS History: Patient Active Problem List   Diagnosis Date Noted  . Primary osteoarthritis of right knee 11/27/2015  . S/P total knee replacement using cement 11/27/2015  . Overactive  bladder 11/15/2015  . Essential hypertension 04/17/2015  . Atherosclerosis of both carotid arteries 03/14/2015  . Advance care planning 08/21/2014  . Vitamin D deficiency 06/08/2013  . Depression, major, in remission ([facility]) 06/08/2013  . Obesity, Class III, BMI 40-49.9 (morbid obesity) ([facility]) 02/04/2011  . Hypothyroidism 10/21/2010   Past Medical History:  Diagnosis Date  . Arthritis   . Depression   . Diverticulosis   . Essential hypertension   . GERD (gastroesophageal reflux disease)   . Heart murmur    slight murmur per pt  . Hypothyroid   . Impaired fasting glucose 5/07  . Kidney stone   . OAB (overactive bladder)   . Obesity   . Pneumonia   . Vitamin D deficiency     Family History  Problem Relation Age of Onset  . Parkinsonism Mother   . Diabetes Brother   . Heart disease Brother     dx'd in 20's  . HIV Brother   . Arthritis Sister     rheumatoid  . Arthritis Sister     rheumatoid  . Cancer Neg Hx   . Colon cancer Neg Hx   . Stomach cancer Neg Hx     Past Surgical History:  Procedure Laterality Date  . APPENDECTOMY    . CESAREAN SECTION    . COLONOSCOPY  02/04/2011   diverticulosis  . ORIF  FOREARM FRACTURE  2002  . THYROIDECTOMY  1975  . TONSILLECTOMY    . TOTAL KNEE ARTHROPLASTY Right 11/27/2015   Procedure: TOTAL KNEE ARTHROPLASTY;  Surgeon: Garald Balding, MD;  Location: [facility];  Service: Orthopedics;  Laterality: Right;   Social History   Occupational History  . hairdresser [employer]   Social History Main Topics  . Smoking status: Former Smoker    Quit date: 05/05/1978  . Smokeless tobacco: Never Used  . Alcohol use 0.0 oz/week     Comment: a glass of wine or beer once or twice a month or less  . Drug use: No  . Sexual activity: Not Currently

## 2016-03-19 NOTE — Progress Notes (Signed)
Chief Complaint  Patient presents with  . Ear Pain    right ear pain that started a few days ago, notices it more at night. It does come and go.    The end of last week she noticed discomfort in her right ear.  Pain is deep at the front of her ear, throbbing.  Pain is intermittent, sometimes during the day, sometimes at night.  Not related to eating.  No known bruxism. Denies ear drainage.  She always has some mild congestion this time of year. She has some dried blood in the nose in the mornings.  Denies sore throat.  Rare sneeze, no itchy eyes. +sniffles.  Rare cough.  +sick contacts (son, granddaughters).  Went back to work after 3 weeks after knee replacement.  Doing well now--denies pain. Plans for injections to the left knee soon.  PMH, PSH, SH reviewed  Outpatient Encounter Prescriptions as of 03/19/2016  Medication Sig  . aspirin EC 81 MG tablet Take 81 mg by mouth daily.  Marland Kitchen buPROPion (WELLBUTRIN XL) 300 MG 24 hr tablet Take 1 tablet (300 mg total) by mouth daily.  . Cholecalciferol (VITAMIN D) 1000 UNITS capsule Take 1,000 Units by mouth daily.    Marland Kitchen esomeprazole (NEXIUM) 20 MG capsule Take 20 mg by mouth daily at 12 noon.  Marland Kitchen FLUoxetine (PROZAC) 40 MG capsule Take 1 capsule (40 mg total) by mouth daily.  . hydrochlorothiazide (HYDRODIURIL) 25 MG tablet Take 1 tablet (25 mg total) by mouth daily.  Marland Kitchen SYNTHROID 200 MCG tablet Take 1 tablet (200 mcg total) by mouth daily before breakfast.  . [DISCONTINUED] HYDROcodone-acetaminophen (NORCO) 7.5-325 MG tablet Take 1 tablet by mouth every 6 (six) hours as needed for moderate pain.  . [DISCONTINUED] methocarbamol (ROBAXIN) 500 MG tablet Take 1 tablet (500 mg total) by mouth every 6 (six) hours as needed for muscle spasms. (Patient not taking: Reported on 12/27/2015)  . [DISCONTINUED] oxybutynin (DITROPAN XL) 10 MG 24 hr tablet Take 1 tablet (10 mg total) by mouth at bedtime. (Patient not taking: Reported on 12/27/2015)  . [DISCONTINUED]  oxyCODONE-acetaminophen (PERCOCET/ROXICET) 5-325 MG tablet Take 1-2 tablets by mouth every 4 (four) hours as needed for moderate pain. (Patient not taking: Reported on 12/27/2015)  . [DISCONTINUED] rivaroxaban (XARELTO) 10 MG TABS tablet Take 1 tablet (10 mg total) by mouth daily with breakfast. (Patient not taking: Reported on 12/27/2015)   No facility-administered encounter medications on file as of 03/19/2016.    Allergies  Allergen Reactions  . Dilaudid [Hydromorphone Hcl]     Lots of itching    ROS:  Denies fever, chills, nausea, vomiting, diarrhea, rash.  Rare short-lived temporal headache.  No other headaches or sinus pain. Right knee pain resolved, some left knee pain.  See HPI  PHYSICAL EXAM:  BP 136/80 (BP Location: Left Arm, Patient Position: Sitting, Cuff Size: Normal)   Pulse 72   Temp 99.5 F (37.5 C) (Tympanic)   Ht 5\' 5"  (1.651 m)   Wt 234 lb 12.8 oz (106.5 kg)   LMP 05/05/1990   BMI 39.07 kg/m    Well developed, pleasant female with occasional sniffle, in no distress HEENT: PERRL, EOMi, conjunctiva and sclera are clear.  Left TM and EAC normal.  R EAC is normal. R TM is slightly retracted, abnormal light reflex.  No erythema or bulging.  Nasal mucosa is mildly edematous, some brown crusting bilaterally.  OP is clear.  Sinuses nontender Neck: no lymphadenopathy or mass Heart: regular rate and rhythm Lungs:  clear bilaterally Neuro: alert and oriented, cranial nerves intact, normal strength, gait Psych: normal mood, affect, hygiene and grooming  ASSESSMENT/PLAN:  Acute otalgia, right  Need for prophylactic vaccination and inoculation against influenza - Plan: Flu vaccine HIGH DOSE PF (Fluzone High dose)    There is no evidence of ear infection at this time.  There likely is some pressure changes affected the ear drum that can lead to fluid accumulation which potentially can lead to infection.  Let us know if you develop higher fever or worsening pain.  If you have  significant hearing loss in that ear develop, please return for recheck. It is normal to have some intermittent plugging or popping with the current condition. You may use aleve as needed for the pain.  You can try using claritin or zyrtec (avoid the D versions) to see if that helps with the allergies/congestion and ear discomfort.

## 2016-03-26 ENCOUNTER — Ambulatory Visit (INDEPENDENT_AMBULATORY_CARE_PROVIDER_SITE_OTHER): Payer: Medicare Other | Admitting: Orthopedic Surgery

## 2016-03-26 DIAGNOSIS — M1712 Unilateral primary osteoarthritis, left knee: Secondary | ICD-10-CM

## 2016-03-26 MED ORDER — SODIUM HYALURONATE (VISCOSUP) 20 MG/2ML IX SOSY
20.0000 mg | PREFILLED_SYRINGE | INTRA_ARTICULAR | Status: AC | PRN
  Administered 2016-03-26: 20 mg via INTRA_ARTICULAR

## 2016-03-26 MED ORDER — LIDOCAINE HCL 1 % IJ SOLN
3.0000 mL | INTRAMUSCULAR | Status: AC | PRN
  Administered 2016-03-26: 3 mL

## 2016-03-26 NOTE — Progress Notes (Signed)
Office Visit Note   Patient: Katie Kaiser           Date of Birth: 1945-06-28           MRN: [id number] Visit Date: 03/26/2016              Requested by: Rita Ohara, MD [address] [place], [place] [address] PCP: Vikki Ports, MD   Assessment & Plan: Visit Diagnoses:  1. Unilateral primary osteoarthritis, left knee     Plan: Second Euflexa injection was given without difficulty  Follow-Up Instructions: Return in about 1 week (around 04/02/2016).   Orders:  Orders Placed This Encounter  Procedures  . Large Joint Injection/Arthrocentesis   No orders of the defined types were placed in this encounter.     Procedures: Large Joint Inj Date/Time: 03/26/2016 8:55 AM Performed by: Biagio Borg D Authorized by: Biagio Borg D   Consent Given by:  Patient Timeout: prior to procedure the correct patient, procedure, and site was verified   Indications:  Pain and joint swelling Location:  Knee Site:  L knee Prep: patient was prepped and draped in usual sterile fashion   Needle Size:  25 G Needle Length:  1.5 inches Approach:  Anteromedial Ultrasound Guidance: No   Fluoroscopic Guidance: No   Arthrogram: No   Medications:  20 mg Sodium Hyaluronate 20 MG/2ML; 3 mL lidocaine 1 % Aspiration Attempted: No   Patient tolerance:  Patient tolerated the procedure well with no immediate complications     Clinical Data: No additional findings.   Subjective: Chief Complaint  Patient presents with  . Left Knee - Pain    Katie Kaiser is seen today for evaluation and her second Euflexa injection. She is doing well otherwise and has had no reactivity to the injection. Seen today for a Left knee Euflexxa, #2    Review of Systems  Constitutional: Negative.   HENT: Negative.   Respiratory: Negative.   Cardiovascular: Negative.   Gastrointestinal: Negative.   Genitourinary: Negative.   Skin: Negative.   Neurological: Negative.   Hematological: Negative.     Psychiatric/Behavioral: Negative.      Objective: Vital Signs: LMP 05/05/1990   Physical Exam  Constitutional: She is oriented to person, place, and time. She appears well-developed and well-nourished.  HENT:  Head: Normocephalic and atraumatic.  Eyes: EOM are normal. Pupils are equal, round, and reactive to light.  Neck:  No carotid bruits  Pulmonary/Chest: Effort normal.  Neurological: She is alert and oriented to person, place, and time.  Skin: Skin is warm and dry.  Psychiatric: She has a normal mood and affect. Her behavior is normal. Judgment and thought content normal.    Ortho Exam  Her left knee was benign at this time. No erythema or warmth. Good motion from nearly 0-95.  Specialty Comments:  No specialty comments available.  Imaging: No results found.   PMFS History: Patient Active Problem List   Diagnosis Date Noted  . Primary osteoarthritis of right knee 11/27/2015  . S/P total knee replacement using cement 11/27/2015  . Overactive bladder 11/15/2015  . Essential hypertension 04/17/2015  . Atherosclerosis of both carotid arteries 03/14/2015  . Advance care planning 08/21/2014  . Vitamin D deficiency 06/08/2013  . Depression, major, in remission ([facility]) 06/08/2013  . Obesity, Class III, BMI 40-49.9 (morbid obesity) ([facility]) 02/04/2011  . Hypothyroidism 10/21/2010   Past Medical History:  Diagnosis Date  . Arthritis   . Depression   . Diverticulosis   . Essential  hypertension   . GERD (gastroesophageal reflux disease)   . Heart murmur    slight murmur per pt  . Hypothyroid   . Impaired fasting glucose 5/07  . Kidney stone   . OAB (overactive bladder)   . Obesity   . Pneumonia   . Vitamin D deficiency     Family History  Problem Relation Age of Onset  . Parkinsonism Mother   . Diabetes Brother   . Heart disease Brother     dx'd in 42's  . HIV Brother   . Arthritis Sister     rheumatoid  . Arthritis Sister     rheumatoid  . Cancer Neg Hx    . Colon cancer Neg Hx   . Stomach cancer Neg Hx     Past Surgical History:  Procedure Laterality Date  . APPENDECTOMY    . CESAREAN SECTION    . COLONOSCOPY  02/04/2011   diverticulosis  . ORIF FOREARM FRACTURE  2002  . THYROIDECTOMY  1975  . TONSILLECTOMY    . 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 Smoking status: Former Smoker    Quit date: 05/05/1978  . Smokeless tobacco: Never Used  . Alcohol use 0.0 oz/week     Comment: a glass of wine or beer once or twice a month or less  . Drug use: No  . Sexual activity: Not Currently

## 2016-04-02 ENCOUNTER — Ambulatory Visit (INDEPENDENT_AMBULATORY_CARE_PROVIDER_SITE_OTHER): Payer: Medicare Other | Admitting: Orthopedic Surgery

## 2016-04-02 DIAGNOSIS — M25562 Pain in left knee: Secondary | ICD-10-CM

## 2016-04-02 DIAGNOSIS — G8929 Other chronic pain: Secondary | ICD-10-CM

## 2016-04-02 DIAGNOSIS — M1712 Unilateral primary osteoarthritis, left knee: Secondary | ICD-10-CM | POA: Diagnosis not present

## 2016-04-02 MED ORDER — SODIUM HYALURONATE (VISCOSUP) 20 MG/2ML IX SOSY
20.0000 mg | PREFILLED_SYRINGE | INTRA_ARTICULAR | Status: AC | PRN
  Administered 2016-04-02: 20 mg via INTRA_ARTICULAR

## 2016-04-02 MED ORDER — LIDOCAINE HCL 1 % IJ SOLN
3.0000 mL | INTRAMUSCULAR | Status: AC | PRN
  Administered 2016-04-02: 3 mL

## 2016-04-02 NOTE — Progress Notes (Signed)
Office Visit Note   Patient: Katie Kaiser           Date of Birth: [birth year]           MRN: [id number] Visit Date: 04/02/2016              Requested by: Rita Ohara, MD [address] [place], [place] [address] PCP: Vikki Ports, MD   Assessment & Plan: Visit Diagnoses:  1. Unilateral primary osteoarthritis, left knee   2. Chronic pain of left knee     Plan: The third Euflexxa. injection was given without difficulty. She will return back on a when necessary basis.  Follow-Up Instructions: Return if symptoms worsen or fail to improve.   Orders:  No orders of the defined types were placed in this encounter.  No orders of the defined types were placed in this encounter.     Procedures: Large Joint Inj Date/Time: 04/02/2016 10:25 AM Performed by: Biagio Borg D Authorized by: Biagio Borg D   Consent Given by:  Patient Timeout: prior to procedure the correct patient, procedure, and site was verified   Indications:  Pain and joint swelling Location:  Knee Site:  L knee Prep: patient was prepped and draped in usual sterile fashion   Needle Size:  25 G Needle Length:  1.5 inches Approach:  Anteromedial Ultrasound Guidance: No   Fluoroscopic Guidance: No   Arthrogram: No   Medications:  20 mg Sodium Hyaluronate 20 MG/2ML; 3 mL lidocaine 1 % Aspiration Attempted: No   Patient tolerance:  Patient tolerated the procedure well with no immediate complications     Clinical Data: No additional findings.   Subjective: No chief complaint on file.   Katie Kaiser returns today for her third Euflexa injections. She denies any reactivity. She's very happy with her results so far with Euflexa to the left knee. She also is thankful for the relief that she has had with her right total knee arthroplasty.    Review of Systems  Constitutional: Negative.   HENT: Negative.   Respiratory: Negative.   Cardiovascular: Negative.   Gastrointestinal: Negative.     Genitourinary: Negative.   Skin: Negative.   Neurological: Negative.   Hematological: Negative.   Psychiatric/Behavioral: Negative.      Objective: Vital Signs: LMP 05/05/1990   Physical Exam  Constitutional: She is oriented to person, place, and time. She appears well-developed and well-nourished.  HENT:  Head: Normocephalic and atraumatic.  Eyes: EOM are normal. Pupils are equal, round, and reactive to light.  Neck:  No carotid bruits  Pulmonary/Chest: Effort normal.  Musculoskeletal:       Left knee: She exhibits effusion (trace).  Neurological: She is alert and oriented to person, place, and time.  Skin: Skin is warm and dry.  Psychiatric: She has a normal mood and affect. Her behavior is normal. Judgment and thought content normal.    Left Knee Exam   Tenderness  The patient is experiencing no tenderness.     Range of Motion  Extension: 0  Flexion: 100   Muscle Strength   The patient has normal left knee strength.  Other  Sensation: normal Pulse: present Effusion: effusion (trace) present      Specialty Comments:  No specialty comments available.  Imaging: No results found.   PMFS History: Patient Active Problem List   Diagnosis Date Noted  . Unilateral primary osteoarthritis, left knee 04/02/2016  . Primary osteoarthritis of right knee 11/27/2015  . S/P total knee replacement using cement  11/27/2015  . Overactive bladder 11/15/2015  . Essential hypertension 04/17/2015  . Atherosclerosis of both carotid arteries 03/14/2015  . Advance care planning 08/21/2014  . Vitamin D deficiency 06/08/2013  . Depression, major, in remission ([facility]) 06/08/2013  . Obesity, Class III, BMI 40-49.9 (morbid obesity) ([facility]) 02/04/2011  . Hypothyroidism 10/21/2010   Past Medical History:  Diagnosis Date  . Arthritis   . Depression   . Diverticulosis   . Essential hypertension   . GERD (gastroesophageal reflux disease)   . Heart murmur    slight murmur per  pt  . Hypothyroid   . Impaired fasting glucose 5/07  . Kidney stone   . OAB (overactive bladder)   . Obesity   . Pneumonia   . Vitamin D deficiency     Family History  Problem Relation Age of Onset  . Parkinsonism Mother   . Diabetes Brother   . Heart disease Brother     dx'd in 64's  . HIV Brother   . Arthritis Sister     rheumatoid  . Arthritis Sister     rheumatoid  . Cancer Neg Hx   . Colon cancer Neg Hx   . Stomach cancer Neg Hx     Past Surgical History:  Procedure Laterality Date  . APPENDECTOMY    . CESAREAN SECTION    . COLONOSCOPY  02/04/2011   diverticulosis  . ORIF FOREARM FRACTURE  2002  . THYROIDECTOMY  1975  . TONSILLECTOMY    . 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 Smoking status: Former Smoker    Quit date: 05/05/1978  . Smokeless tobacco: Never Used  . Alcohol use 0.0 oz/week     Comment: a glass of wine or beer once or twice a month or less  . Drug use: No  . Sexual activity: Not Currently

## 2016-05-06 ENCOUNTER — Other Ambulatory Visit: Payer: Self-pay | Admitting: Family Medicine

## 2016-05-06 ENCOUNTER — Telehealth: Payer: Self-pay | Admitting: Family Medicine

## 2016-05-06 DIAGNOSIS — E039 Hypothyroidism, unspecified: Secondary | ICD-10-CM

## 2016-05-06 MED ORDER — SYNTHROID 200 MCG PO TABS: ORAL_TABLET | ORAL | 0 refills | Status: DC

## 2016-05-06 NOTE — Telephone Encounter (Signed)
Looks like #90 was sent to [facility].  Looks like it also was sent to [facility] for #90 (?). V--you might want to touch base with pt to see if she plans to get from there.  She could have it refilled through June (not sure if she picked up 30 or 90 from [facility]?)

## 2016-05-06 NOTE — Telephone Encounter (Signed)
Pt called and requesting a refill on her synthroid, pt states she was suppose to get it from the [facility] but has never gotten it from them and would like it sent to the [facility] [address] [place] - [address] pt can be reached at [phone] pt is completely out, informed pt that dr knapp was out of the office today.

## 2016-05-07 NOTE — Telephone Encounter (Signed)
Patient said she did get a call from [facility] and it is on the way.

## 2016-05-08 ENCOUNTER — Ambulatory Visit: Payer: Medicare Other | Admitting: Family Medicine

## 2016-07-16 ENCOUNTER — Telehealth: Payer: Self-pay | Admitting: Family Medicine

## 2016-07-16 NOTE — Telephone Encounter (Signed)
Spoke with patient and scheduled her for next Monday.

## 2016-07-16 NOTE — Telephone Encounter (Signed)
Pt needs to schedule OV to discuss (30 min).  Last TSH was 10/2015--would be worth repeating.  Also, to f/u on her anemia. We can then discuss other BP options if she wants to give a trial of stopping the HCTZ (and replace it with something else). Stressful events can also cause hair loss, including surgery (meds given, stress on the body, etc)  Looks like she has been on the HCTZ since end of 2016 or so. She currently has no f/u visit scheduled.  She recently canceled her AWV (scheduled for January, didn't R/S).

## 2016-07-16 NOTE — Telephone Encounter (Signed)
Pt called and states that she would like you to give her a call, she has had some thinning of the hair she thinks that it is coming from the hydrochlorothiazide,, states it has been going on for a while, she thought it was the time of the year or from her thyroid but it is not according to her, she is pretty sure it is coming from her medicine, she can be reached at [phone]

## 2016-07-21 ENCOUNTER — Ambulatory Visit (INDEPENDENT_AMBULATORY_CARE_PROVIDER_SITE_OTHER): Payer: Medicare Other | Admitting: Family Medicine

## 2016-07-21 ENCOUNTER — Other Ambulatory Visit: Payer: Self-pay | Admitting: Family Medicine

## 2016-07-21 ENCOUNTER — Encounter: Payer: Self-pay | Admitting: Family Medicine

## 2016-07-21 VITALS — BP 140/80 | HR 80 | Ht 65.0 in | Wt 238.0 lb

## 2016-07-21 DIAGNOSIS — E039 Hypothyroidism, unspecified: Secondary | ICD-10-CM | POA: Diagnosis not present

## 2016-07-21 DIAGNOSIS — Z79899 Other long term (current) drug therapy: Secondary | ICD-10-CM | POA: Diagnosis not present

## 2016-07-21 DIAGNOSIS — R5383 Other fatigue: Secondary | ICD-10-CM | POA: Diagnosis not present

## 2016-07-21 DIAGNOSIS — Z5181 Encounter for therapeutic drug level monitoring: Secondary | ICD-10-CM | POA: Diagnosis not present

## 2016-07-21 DIAGNOSIS — D649 Anemia, unspecified: Secondary | ICD-10-CM | POA: Diagnosis not present

## 2016-07-21 DIAGNOSIS — D539 Nutritional anemia, unspecified: Secondary | ICD-10-CM | POA: Diagnosis not present

## 2016-07-21 DIAGNOSIS — L659 Nonscarring hair loss, unspecified: Secondary | ICD-10-CM | POA: Diagnosis not present

## 2016-07-21 DIAGNOSIS — R011 Cardiac murmur, unspecified: Secondary | ICD-10-CM

## 2016-07-21 LAB — TSH: TSH: 1.54 m[IU]/L

## 2016-07-21 LAB — COMPREHENSIVE METABOLIC PANEL
ALBUMIN: 3.8 g/dL (ref 3.6–5.1)
ALT: 13 U/L (ref 6–29)
AST: 13 U/L (ref 10–35)
Alkaline Phosphatase: 87 U/L (ref 33–130)
BILIRUBIN TOTAL: 0.3 mg/dL (ref 0.2–1.2)
BUN: 22 mg/dL (ref 7–25)
CALCIUM: 9.2 mg/dL (ref 8.6–10.4)
CO2: 28 mmol/L (ref 20–31)
Chloride: 102 mmol/L (ref 98–110)
Creat: 0.55 mg/dL (ref 0.50–0.99)
GLUCOSE: 91 mg/dL (ref 65–99)
POTASSIUM: 3.9 mmol/L (ref 3.5–5.3)
Sodium: 137 mmol/L (ref 135–146)
Total Protein: 6.7 g/dL (ref 6.1–8.1)

## 2016-07-21 LAB — MAGNESIUM: Magnesium: 1.8 mg/dL (ref 1.5–2.5)

## 2016-07-21 LAB — CBC WITH DIFFERENTIAL/PLATELET
BASOS ABS: 0 {cells}/uL (ref 0–200)
BASOS PCT: 0 %
EOS PCT: 3 %
Eosinophils Absolute: 249 cells/uL (ref 15–500)
HCT: 34.3 % — ABNORMAL LOW (ref 35.0–45.0)
HEMOGLOBIN: 10.9 g/dL — AB (ref 11.7–15.5)
LYMPHS ABS: 1909 {cells}/uL (ref 850–3900)
Lymphocytes Relative: 23 %
MCH: 23.8 pg — ABNORMAL LOW (ref 27.0–33.0)
MCHC: 31.8 g/dL — ABNORMAL LOW (ref 32.0–36.0)
MCV: 74.9 fL — ABNORMAL LOW (ref 80.0–100.0)
MONOS PCT: 8 %
MPV: 8.7 fL (ref 7.5–12.5)
Monocytes Absolute: 664 cells/uL (ref 200–950)
NEUTROS ABS: 5478 {cells}/uL (ref 1500–7800)
Neutrophils Relative %: 66 %
PLATELETS: 252 10*3/uL (ref 140–400)
RBC: 4.58 MIL/uL (ref 3.80–5.10)
RDW: 16.4 % — ABNORMAL HIGH (ref 11.0–15.0)
WBC: 8.3 10*3/uL (ref 4.0–10.5)

## 2016-07-21 NOTE — Progress Notes (Signed)
Chief Complaint  Patient presents with  . Hair/Scalp Problem    hairloss and excessive fatigue over the last 4 months or so.    Per recent phone call: She is complaining of some thinning of the hair she thinks that it is coming from the hydrochlorothiazide. States it has been going on for a while, she thought it was the time of the year or from her thyroid but it is not according to her-- she is pretty sure it is coming from her medicine.  Has been on HCTZ since end of 2016 or so.  She notices hair thinning diffusely over the last 4 months, most noticeably on the top.  She does notice some re-growth, short hairs.  Denies breakage/brittle hair. Denies any flaking or scalp lesions.  No focal area of hair loss.  Contributing factors (other than meds as she is worried about, per above) could be the stress of knee surgery, anesthesia in July.  "I'm tired all the time and half asleep all the time" She sleeps 8-9 hours/night, up twice to void and easily falls back to sleep.  Still feels tired when she wakes up. She lives alone, "probably snores". Denies headaches. She didn't have high risk screen for sleep apnea prior to surgery, per pt.    HTN--doesn't check blood pressure elsewhere.  Denies headaches, dizziness, chest pain, change in swelling in her feet.  Anemia--baseline appears to be Hg 11.3--was down to 8.2 post-operatively after knee surgery. Hasn't been checked since.  She reports having abdominal cramping since Friday, some diarrhea. Decreased appetite, no nausea, vomiting.  +sick contacts. She hasn't been eating much, bland.  PMH, PSH, SH reviewed  Outpatient Encounter Prescriptions as of 07/21/2016  Medication Sig  . aspirin EC 81 MG tablet Take 81 mg by mouth daily.  Marland Kitchen buPROPion (WELLBUTRIN XL) 300 MG 24 hr tablet Take 1 tablet (300 mg total) by mouth daily.  . Cholecalciferol (VITAMIN D) 1000 UNITS capsule Take 1,000 Units by mouth daily.    Marland Kitchen esomeprazole (NEXIUM) 20 MG capsule  Take 20 mg by mouth daily at 12 noon.  Marland Kitchen FLUoxetine (PROZAC) 40 MG capsule Take 1 capsule (40 mg total) by mouth daily.  . hydrochlorothiazide (HYDRODIURIL) 25 MG tablet Take 1 tablet (25 mg total) by mouth daily.  Marland Kitchen SYNTHROID 200 MCG tablet TAKE 1 TABLET (200 MCG) DAILY BEFORE BREAKFAST   No facility-administered encounter medications on file as of 07/21/2016.    Allergies  Allergen Reactions  . Dilaudid [Hydromorphone Hcl]     Lots of itching    ROS:  +fatigue, hair loss per HPI.  Moods are good, well controlled.  No fever, chills, URI symptoms.  +GI complaints (cramping, diarrhea, decreased appetite) x 3 days per HPI.  No urinary complaints, skin changes, bleeding, bruising.  PHYSICAL EXAM:  BP 140/80 (BP Location: Left Arm, Patient Position: Sitting, Cuff Size: Normal)   Pulse 80   Ht '5\' 5"'$  (1.651 m)   Wt 238 lb (108 kg)   LMP 05/05/1990   BMI 39.61 kg/m   Wt Readings from Last 3 Encounters:  07/21/16 238 lb (108 kg)  03/19/16 234 lb 12.8 oz (106.5 kg)  11/27/15 235 lb (106.6 kg)    Well appearing, pleasant female in no distress. Some yawning during visit. HEENT: conjunctiva and sclera are clear, OP clear. Scalp with diffuse thinning throughout.  No breakage.  Scalp skin is normal. Short thin hairs regrowing. Neck: no lymphadenopathy, thyromegaly or mass Heart: regular rate and rhythm. Murmur  3/6, loudest at RUSB. Lungs: clear bilaterally Abdomen: normal bowel sounds, soft, nontender, no mass Extremities: wearing compression stockings.  No pitting edema Neuro: alert and oriented, cranial nerves intact, normal gait Psych: normal mood, affect, hygiene and grooming.    Lab Results  Component Value Date   TSH 2.28 10/18/2015   Lab Results  Component Value Date   WBC 8.5 11/30/2015   HGB 8.2 (L) 11/30/2015   HCT 24.6 (L) 11/30/2015   MCV 78.8 11/30/2015   PLT 152 11/30/2015   Lab Results  Component Value Date   FERRITIN 30 10/18/2015   ASSESSMENT/PLAN:  Hair  loss - ddx reviewed--if TSH normal, may be telogen effluvium; discussed hair/skin/nail vitamins. Consider rogaine if needed - Plan: TSH  Hypothyroidism, unspecified type - Plan: TSH  Medication monitoring encounter - Plan: Magnesium, Comprehensive metabolic panel, CBC with Differential/Platelet, TSH  Other fatigue - recheck for anemia; recheck thyroid. Consider sleep apnea.  If tests normal, refer for sleep study - Plan: Comprehensive metabolic panel, CBC with Differential/Platelet, TSH  Heart murmur   c-met, TSH, CBC, mg Add on iron studies or B12 if CBC not back to baseline or suggestive of abnl.   Murmur--no evidence of echo in system.  If labs don't have any reason for fatigue, echo would be recommended.   r/s AWV (canceled in January)

## 2016-07-21 NOTE — Patient Instructions (Addendum)
  We are checking your thyroid to assess your hair loss complaints (and fatigue), as well as rechecking other causes for fatigue.  If we don't find any causes for fatigue, we probably should set up a sleep study--given that you are getting adequate hours of sleep, but wake up feeling unrefreshed.  If it seems like your hair is starting to have regrowth, and that hair loss started about 3-4 months after your surgery, it is possible that you have a condition called telogen effluvium, which is self-limited, and should resolve.  Patients taking chronic proton pump inhibitors (ie Nexium) should take a supplement containing magnesium, iron, B12, (Multivitamin with minerals) and calcium, as these don't get well absorbed.  I'm checking some of this with today's bloodwork, but taking vitamins would be a good idea.  We will be in touch with your results in 1-2 days (tomorrow on MyChart). If tests look normal, we may want to send you for an ultrasound of the heart to further evaluate the murmur, to see if that is contributing to your fatigue.

## 2016-07-22 ENCOUNTER — Encounter (INDEPENDENT_AMBULATORY_CARE_PROVIDER_SITE_OTHER): Payer: Self-pay | Admitting: Orthopaedic Surgery

## 2016-07-22 ENCOUNTER — Ambulatory Visit (INDEPENDENT_AMBULATORY_CARE_PROVIDER_SITE_OTHER): Payer: Medicare Other | Admitting: Orthopaedic Surgery

## 2016-07-22 ENCOUNTER — Ambulatory Visit (INDEPENDENT_AMBULATORY_CARE_PROVIDER_SITE_OTHER): Payer: Medicare Other

## 2016-07-22 VITALS — BP 147/82 | HR 73 | Resp 14 | Ht 63.0 in | Wt 250.0 lb

## 2016-07-22 DIAGNOSIS — M25561 Pain in right knee: Secondary | ICD-10-CM

## 2016-07-22 DIAGNOSIS — Z96651 Presence of right artificial knee joint: Secondary | ICD-10-CM | POA: Diagnosis not present

## 2016-07-22 LAB — VITAMIN B12: Vitamin B-12: 533 pg/mL (ref 200–1100)

## 2016-07-22 LAB — IRON: Iron: 24 ug/dL — ABNORMAL LOW (ref 45–160)

## 2016-07-22 LAB — FERRITIN: Ferritin: 16 ng/mL — ABNORMAL LOW (ref 20–288)

## 2016-07-22 NOTE — Progress Notes (Signed)
Office Visit Note   Patient: Katie Kaiser           Date of Birth: 1945-10-31           MRN: [id number] Visit Date: 07/22/2016              Requested by: Katie Ohara, MD [address] [place],  [address] PCP: Katie Ports, MD   Assessment & Plan: Visit Diagnoses:  1. S/P TKR (total knee replacement) using cement, right   2. Right knee pain, unspecified chronicity     Plan:  Stay with conservative treatment at this time. If her symptoms worsen certainly we could reevaluate this. Follow back up when necessary.  Follow-Up Instructions: Return if symptoms worsen or fail to improve.   Orders:  Orders Placed This Encounter  Procedures  . XR KNEE 3 VIEW RIGHT   No orders of the defined types were placed in this encounter.     Procedures: No procedures performed   Clinical Data: No additional findings.   Subjective: Chief Complaint  Patient presents with  . Right Knee - Pain    Katie Kaiser is seen today here for evaluation of her right total knee arthroplasty. She is 71 year old white female who underwent a total knee arthroplasty on 11/27/2015. She is been doing very well overall. Last several weeks she's been having some pain mainly along the lateral aspect of the proximal tibia. Denies any history of injury or trauma. She states even that the pain she has now is nothing compared to what she had preoperatively. She overall is very happy with her total joint at this time.      Review of Systems  Constitutional: Negative.   HENT: Negative.   Respiratory: Negative.   Cardiovascular: Negative.   Gastrointestinal: Negative.   Genitourinary: Negative.   Skin: Negative.   Neurological: Negative.   Hematological: Negative.   Psychiatric/Behavioral: Negative.      Objective: Vital Signs: BP (!) 147/82   Pulse 73   Resp 14   Ht 5\' 3"  (1.6 m)   Wt 250 lb (113.4 kg)   LMP 05/05/1990   BMI 44.29 kg/m   Physical Exam  Constitutional: She is  oriented to person, place, and time. She appears well-developed and well-nourished.  HENT:  Head: Normocephalic and atraumatic.  Eyes: EOM are normal. Pupils are equal, round, and reactive to light.  Neck:  No carotid bruits  Pulmonary/Chest: Effort normal.  Musculoskeletal:       Right knee: She exhibits no effusion.       Left knee: She exhibits effusion (trace).  Neurological: She is alert and oriented to person, place, and time.  Skin: Skin is warm and dry.  Psychiatric: She has a normal mood and affect. Her behavior is normal. Judgment and thought content normal.    Right Knee Exam   Tenderness  Right knee tenderness location: Mild tenderness at the lateral proximal tibia.  Range of Motion  Extension: 0  Right knee flexion: 105.   Muscle Strength   The patient has normal right knee strength.  Other  Sensation: normal Pulse: present Swelling: none Other tests: no effusion present  Comments:  Good ligamentous stability on exam.   Left Knee Exam   Other  Effusion: effusion (trace) present      Specialty Comments:  No specialty comments available.  Imaging: Xr Knee 3 View Right  Result Date: 07/22/2016 Three-view x-rays of the left knee reveals total knee arthroplasty in excellent position and alignment.  Does not appear to have any radiolucencies consistent with loosening.    PMFS History: Patient Active Problem List   Diagnosis Date Noted  . Unilateral primary osteoarthritis, left knee 04/02/2016  . Primary osteoarthritis of right knee 11/27/2015  . S/P total knee replacement using cement 11/27/2015  . Overactive bladder 11/15/2015  . Essential hypertension 04/17/2015  . Atherosclerosis of both carotid arteries 03/14/2015  . Advance care planning 08/21/2014  . Vitamin D deficiency 06/08/2013  . Depression, major, in remission ([place]) 06/08/2013  . Obesity, Class III, BMI 40-49.9 (morbid obesity) ([place]) 02/04/2011  . Hypothyroidism 10/21/2010    Past Medical History:  Diagnosis Date  . Arthritis   . Depression   . Diverticulosis   . Essential hypertension   . GERD (gastroesophageal reflux disease)   . Heart murmur    slight murmur per pt  . Hypothyroid   . Impaired fasting glucose 5/07  . Kidney stone   . OAB (overactive bladder)   . Obesity   . Pneumonia   . Vitamin D deficiency     Family History  Problem Relation Age of Onset  . Parkinsonism Mother   . Diabetes Brother   . Heart disease Brother     dx'd in 36's  . HIV Brother   . Arthritis Sister     rheumatoid  . Arthritis Sister     rheumatoid  . Cancer Neg Hx   . Colon cancer Neg Hx   . Stomach cancer Neg Hx     Past Surgical History:  Procedure Laterality Date  . APPENDECTOMY    . CESAREAN SECTION    . COLONOSCOPY  02/04/2011   diverticulosis  . ORIF FOREARM FRACTURE  2002  . THYROIDECTOMY  1975  . TONSILLECTOMY    . 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 Smoking status: Former Smoker    Quit date: 05/05/1978  . Smokeless tobacco: Never Used  . Alcohol use 0.0 oz/week     Comment: a glass of wine or beer once or twice a month or less  . Drug use: No  . Sexual activity: Not Currently

## 2016-07-23 ENCOUNTER — Other Ambulatory Visit: Payer: Self-pay | Admitting: *Deleted

## 2016-07-23 DIAGNOSIS — R0683 Snoring: Secondary | ICD-10-CM

## 2016-07-23 DIAGNOSIS — R400 Somnolence: Secondary | ICD-10-CM

## 2016-07-23 DIAGNOSIS — G478 Other sleep disorders: Secondary | ICD-10-CM

## 2016-09-09 ENCOUNTER — Encounter (HOSPITAL_BASED_OUTPATIENT_CLINIC_OR_DEPARTMENT_OTHER): Payer: Medicare Other

## 2016-09-10 ENCOUNTER — Telehealth: Payer: Self-pay | Admitting: Family Medicine

## 2016-09-10 NOTE — Telephone Encounter (Signed)
Pt ordered a refill of Synthroid from mail order pharmacy too late for her to get it before she will run out of meds. Pt wants to know if she can get a few samples of this med even if she will have to get 50 mg again.

## 2016-09-10 NOTE — Telephone Encounter (Signed)
Patient given one week's worth.

## 2016-10-28 ENCOUNTER — Telehealth (INDEPENDENT_AMBULATORY_CARE_PROVIDER_SITE_OTHER): Payer: Self-pay | Admitting: Orthopaedic Surgery

## 2016-10-28 NOTE — Telephone Encounter (Signed)
Patient called wanting to start the process to get the next round of Euflexxa injections.  CB#[phone].  Thank you.

## 2016-10-28 NOTE — Telephone Encounter (Signed)
OK 

## 2016-10-28 NOTE — Telephone Encounter (Signed)
Faxed info 10/28/16

## 2016-10-28 NOTE — Telephone Encounter (Signed)
OK to do-

## 2016-11-07 ENCOUNTER — Telehealth: Payer: Self-pay | Admitting: *Deleted

## 2016-11-07 NOTE — Telephone Encounter (Signed)
Please schedule Euflexxa x 3, buy and bill, left knee, with Aaron Edelman on Wednesday's. Thank you.

## 2016-11-09 ENCOUNTER — Other Ambulatory Visit: Payer: Self-pay | Admitting: Family Medicine

## 2016-11-09 DIAGNOSIS — F325 Major depressive disorder, single episode, in full remission: Secondary | ICD-10-CM

## 2016-11-10 NOTE — Telephone Encounter (Signed)
Refilled until October visit.

## 2016-11-10 NOTE — Telephone Encounter (Signed)
Is this okay to refill? Pt has AWV in Oct, last AWV was last June-seen for hair loss in March.

## 2016-11-19 ENCOUNTER — Ambulatory Visit (INDEPENDENT_AMBULATORY_CARE_PROVIDER_SITE_OTHER): Payer: Medicare Other | Admitting: Orthopedic Surgery

## 2016-11-19 DIAGNOSIS — M1712 Unilateral primary osteoarthritis, left knee: Secondary | ICD-10-CM | POA: Diagnosis not present

## 2016-11-19 MED ORDER — SODIUM HYALURONATE (VISCOSUP) 20 MG/2ML IX SOSY
20.0000 mg | PREFILLED_SYRINGE | INTRA_ARTICULAR | Status: AC | PRN
  Administered 2016-11-19: 20 mg via INTRA_ARTICULAR

## 2016-11-19 MED ORDER — LIDOCAINE HCL 2 % IJ SOLN
2.0000 mL | INTRAMUSCULAR | Status: AC | PRN
Start: 2016-11-19 — End: 2016-11-19
  Administered 2016-11-19: 2 mL

## 2016-11-19 NOTE — Progress Notes (Signed)
Office Visit Note   Patient: Katie Kaiser           Date of Birth: 05-Mar-1946           MRN: [id number] Visit Date: 11/19/2016              Requested by: Rita Ohara, [address] [place], [place] [address] PCP: Rita Ohara, MD   Assessment & Plan: Visit Diagnoses:  1. Unilateral primary osteoarthritis, left knee     Plan:  #1: Euflex injection of #1 to the left knee #2: Follow back up with [place] in 1 week for her second Euflexa injection the left knee  Follow-Up Instructions: Return in about 1 week (around 11/26/2016).   Orders:  No orders of the defined types were placed in this encounter.  No orders of the defined types were placed in this encounter.     Procedures: Large Joint Inj Date/Time: 11/19/2016 12:41 PM Performed by: Biagio Borg D Authorized by: Biagio Borg D   Consent Given by:  Patient Timeout: prior to procedure the correct patient, procedure, and site was verified   Indications:  Pain and joint swelling Location:  Knee Site:  L knee Prep: patient was prepped and draped in usual sterile fashion   Needle Size:  25 G Needle Length:  1.5 inches Approach:  Anteromedial Ultrasound Guidance: No   Fluoroscopic Guidance: No   Arthrogram: No   Medications:  20 mg Sodium Hyaluronate 20 MG/2ML; 2 mL lidocaine 2 % Aspiration Attempted: No   Patient tolerance:  Patient tolerated the procedure well with no immediate complications     Clinical Data: No additional findings.   Subjective: Chief Complaint  Patient presents with  . Left Knee - Pain  . Knee Pain    Euflexxa # 1 left buy and bill    Katie Kaiser is a very pleasant 71 year old white female who is seen today for evaluation of her left knee. She is status post right total knee arthroplasty with excellent results. She only taken 3 weeks off before return to work after her last total knee replacement. However her left knee is continued to be painful to her. She's had very good  success with Euflex injections. Her last ones were in November 2017 to the left knee. She comes in today requesting Euflex injections to the left knee.    Review of Systems  Constitutional: Negative.   HENT: Negative.   Respiratory: Negative.   Cardiovascular: Negative.   Gastrointestinal: Negative.   Genitourinary: Negative.   Skin: Negative.   Neurological: Negative.   Hematological: Negative.   Psychiatric/Behavioral: Negative.      Objective: Vital Signs: LMP 05/05/1990   Physical Exam  Constitutional: She is oriented to person, place, and time. She appears well-developed and well-nourished.  HENT:  Head: Normocephalic and atraumatic.  Eyes: Pupils are equal, round, and reactive to light. EOM are normal.  Pulmonary/Chest: Effort normal.  Neurological: She is alert and oriented to person, place, and time.  Skin: Skin is warm and dry.  Psychiatric: She has a normal mood and affect. Her behavior is normal. Judgment and thought content normal.    Ortho Exam  Today she has a trace effusion in the knee. She ranges from little bit shy of full extension to that about 100 of flexion. Crepitus with range of motion. Some laxity with varus and valgus stressing with good endpoints. This is minimal.  Specialty Comments:  No specialty comments available.  Imaging: No results found.  PMFS History: Current Outpatient Prescriptions  Medication Sig Dispense Refill  . aspirin EC 81 MG tablet Take 81 mg by mouth daily.    Marland Kitchen buPROPion (WELLBUTRIN XL) 300 MG 24 hr tablet TAKE ONE TABLET BY MOUTH ONCE DAILY 30 tablet 2  . Cholecalciferol (VITAMIN D) 1000 UNITS capsule Take 1,000 Units by mouth daily.      Marland Kitchen esomeprazole (NEXIUM) 20 MG capsule Take 20 mg by mouth daily at 12 noon.    Marland Kitchen FLUoxetine (PROZAC) 40 MG capsule Take 1 capsule (40 mg total) by mouth daily. 90 capsule 3  . hydrochlorothiazide (HYDRODIURIL) 25 MG tablet Take 1 tablet (25 mg total) by mouth daily. 90 tablet 3  .  SYNTHROID 200 MCG tablet TAKE 1 TABLET (200 MCG) DAILY BEFORE BREAKFAST 90 tablet 0   No current facility-administered medications for this visit.      Patient Active Problem List   Diagnosis Date Noted  . Unilateral primary osteoarthritis, left knee 04/02/2016  . Primary osteoarthritis of right knee 11/27/2015  . S/P total knee replacement using cement 11/27/2015  . Overactive bladder 11/15/2015  . Essential hypertension 04/17/2015  . Atherosclerosis of both carotid arteries 03/14/2015  . Advance care planning 08/21/2014  . Vitamin D deficiency 06/08/2013  . Depression, major, in remission ([place]) 06/08/2013  . Obesity, Class III, BMI 40-49.9 (morbid obesity) ([place]) 02/04/2011  . Hypothyroidism 10/21/2010   Past Medical History:  Diagnosis Date  . Arthritis   . Depression   . Diverticulosis   . Essential hypertension   . GERD (gastroesophageal reflux disease)   . Heart murmur    slight murmur per pt  . Hypothyroid   . Impaired fasting glucose 5/07  . Kidney stone   . OAB (overactive bladder)   . Obesity   . Pneumonia   . Vitamin D deficiency     Family History  Problem Relation Age of Onset  . Parkinsonism Mother   . Diabetes Brother   . Heart disease Brother        dx'd in 66's  . HIV Brother   . Arthritis Sister        rheumatoid  . Arthritis Sister        rheumatoid  . Cancer Neg Hx   . Colon cancer Neg Hx   . Stomach cancer Neg Hx     Past Surgical History:  Procedure Laterality Date  . APPENDECTOMY    . CESAREAN SECTION    . COLONOSCOPY  02/04/2011   diverticulosis  . ORIF FOREARM FRACTURE  2002  . THYROIDECTOMY  1975  . TONSILLECTOMY    . 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 Smoking status: Former Smoker    Quit  date: 05/05/1978  . Smokeless tobacco: Never Used  . Alcohol use 0.0 oz/week     Comment: a glass of wine or beer once or twice a month or less  . Drug use: No  . Sexual activity: Not Currently

## 2016-11-26 ENCOUNTER — Ambulatory Visit (INDEPENDENT_AMBULATORY_CARE_PROVIDER_SITE_OTHER): Payer: Medicare Other | Admitting: Orthopedic Surgery

## 2016-11-26 DIAGNOSIS — M1712 Unilateral primary osteoarthritis, left knee: Secondary | ICD-10-CM

## 2016-11-26 MED ORDER — LIDOCAINE HCL 2 % IJ SOLN
2.0000 mL | INTRAMUSCULAR | Status: AC | PRN
  Administered 2016-11-26: 2 mL

## 2016-11-26 MED ORDER — SODIUM HYALURONATE (VISCOSUP) 20 MG/2ML IX SOSY
20.0000 mg | PREFILLED_SYRINGE | INTRA_ARTICULAR | Status: AC | PRN
  Administered 2016-11-26: 20 mg via INTRA_ARTICULAR

## 2016-11-26 NOTE — Progress Notes (Signed)
Office Visit Note   Patient: Katie Kaiser           Date of Birth: 03/06/1946           MRN: [id number] Visit Date: 11/26/2016              Requested by: Rita Ohara, [address] [place], [place] [address] PCP: Rita Ohara, MD   Assessment & Plan: Visit Diagnoses:  1. Unilateral primary osteoarthritis, left knee     Plan:  #1: Second Euflex injection was given left knee without difficulty #2: Follow back up with [place] in 1 week for her third final injection to the left knee   Follow-Up Instructions: Return in about 1 week (around 12/03/2016).   Orders:  Orders Placed This Encounter  Procedures  . Large Joint Injection/Arthrocentesis   No orders of the defined types were placed in this encounter.     Procedures: Large Joint Inj Date/Time: 11/26/2016 11:31 AM Performed by: Biagio Borg D Authorized by: Biagio Borg D   Consent Given by:  Patient Timeout: prior to procedure the correct patient, procedure, and site was verified   Indications:  Pain and joint swelling Location:  Knee Site:  L knee Prep: patient was prepped and draped in usual sterile fashion   Needle Size:  25 G Needle Length:  1.5 inches Approach:  Anteromedial Ultrasound Guidance: No   Fluoroscopic Guidance: No   Arthrogram: No   Medications:  20 mg Sodium Hyaluronate 20 MG/2ML; 2 mL lidocaine 2 % Aspiration Attempted: No   Patient tolerance:  Patient tolerated the procedure well with no immediate complications     Clinical Data: No additional findings.   Subjective: Chief Complaint  Patient presents with  . Left Knee - Pain    HPI  Katie Kaiser returns today for Euflex injection to her left knee. She's had one previously and denies any reactivity.  Review of Systems   Objective: Vital Signs: LMP 05/05/1990   Physical Exam  Ortho Exam  Exam today reveals the knee to be benign. No signs of infection. No warmth or erythema. Reactivity.  Specialty Comments:  No  specialty comments available.  Imaging: No results found.   PMFS History: Current Outpatient Prescriptions  Medication Sig Dispense Refill  . aspirin EC 81 MG tablet Take 81 mg by mouth daily.    Marland Kitchen buPROPion (WELLBUTRIN XL) 300 MG 24 hr tablet TAKE ONE TABLET BY MOUTH ONCE DAILY 30 tablet 2  . Cholecalciferol (VITAMIN D) 1000 UNITS capsule Take 1,000 Units by mouth daily.      Marland Kitchen esomeprazole (NEXIUM) 20 MG capsule Take 20 mg by mouth daily at 12 noon.    Marland Kitchen FLUoxetine (PROZAC) 40 MG capsule Take 1 capsule (40 mg total) by mouth daily. 90 capsule 3  . hydrochlorothiazide (HYDRODIURIL) 25 MG tablet Take 1 tablet (25 mg total) by mouth daily. 90 tablet 3  . SYNTHROID 200 MCG tablet TAKE 1 TABLET (200 MCG) DAILY BEFORE BREAKFAST 90 tablet 0   No current facility-administered medications for this visit.      Patient Active Problem List   Diagnosis Date Noted  . Unilateral primary osteoarthritis, left knee 04/02/2016  . Primary osteoarthritis of right knee 11/27/2015  . S/P total knee replacement using cement 11/27/2015  . Overactive bladder 11/15/2015  . Essential hypertension 04/17/2015  . Atherosclerosis of both carotid arteries 03/14/2015  . Advance care planning 08/21/2014  . Vitamin D deficiency 06/08/2013  . Depression, major, in remission ([facility]) 06/08/2013  .  Obesity, Class III, BMI 40-49.9 (morbid obesity) ([facility]) 02/04/2011  . Hypothyroidism 10/21/2010   Past Medical History:  Diagnosis Date  . Arthritis   . Depression   . Diverticulosis   . Essential hypertension   . GERD (gastroesophageal reflux disease)   . Heart murmur    slight murmur per pt  . Hypothyroid   . Impaired fasting glucose 5/07  . Kidney stone   . OAB (overactive bladder)   . Obesity   . Pneumonia   . Vitamin D deficiency     Family History  Problem Relation Age of Onset  . Parkinsonism Mother   . Diabetes Brother   . Heart disease Brother        dx'd in 43's  . HIV Brother   . Arthritis  Sister        rheumatoid  . Arthritis Sister        rheumatoid  . Cancer Neg Hx   . Colon cancer Neg Hx   . Stomach cancer Neg Hx     Past Surgical History:  Procedure Laterality Date  . APPENDECTOMY    . CESAREAN SECTION    . COLONOSCOPY  02/04/2011   diverticulosis  . ORIF FOREARM FRACTURE  2002  . THYROIDECTOMY  1975  . TONSILLECTOMY    . 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 Smoking status: Former Smoker    Quit date: 05/05/1978  . Smokeless tobacco: Never Used  . Alcohol use 0.0 oz/week     Comment: a glass of wine or beer once or twice a month or less  . Drug use: No  . Sexual activity: Not Currently

## 2016-12-03 ENCOUNTER — Ambulatory Visit (INDEPENDENT_AMBULATORY_CARE_PROVIDER_SITE_OTHER): Payer: Medicare Other | Admitting: Orthopedic Surgery

## 2016-12-03 DIAGNOSIS — M1712 Unilateral primary osteoarthritis, left knee: Secondary | ICD-10-CM

## 2016-12-03 MED ORDER — SODIUM HYALURONATE (VISCOSUP) 20 MG/2ML IX SOSY
20.0000 mg | PREFILLED_SYRINGE | INTRA_ARTICULAR | Status: AC | PRN
  Administered 2016-12-03: 20 mg via INTRA_ARTICULAR

## 2016-12-03 MED ORDER — LIDOCAINE HCL 1 % IJ SOLN
3.0000 mL | INTRAMUSCULAR | Status: AC | PRN
Start: 2016-12-03 — End: 2016-12-03
  Administered 2016-12-03: 3 mL

## 2016-12-03 NOTE — Progress Notes (Signed)
Office Visit Note   Patient: Katie Kaiser           Date of Birth: 02/24/1946           MRN: [id number] Visit Date: 12/03/2016              Requested by: Rita Ohara, [address] [place], [place] [address] PCP: Rita Ohara, MD   Assessment & Plan: Visit Diagnoses:  1. Unilateral primary osteoarthritis, left knee     Plan:  #1: Third Euflexa injection was given without difficulty  Follow-Up Instructions: Return if symptoms worsen or fail to improve.   Orders:  No orders of the defined types were placed in this encounter.  No orders of the defined types were placed in this encounter.     Procedures: Large Joint Inj Date/Time: 12/03/2016 11:41 AM Performed by: Biagio Borg D Authorized by: Biagio Borg D   Consent Given by:  Patient Timeout: prior to procedure the correct patient, procedure, and site was verified   Indications:  Pain and joint swelling Location:  Knee Site:  L knee Prep: patient was prepped and draped in usual sterile fashion   Needle Size:  25 G Needle Length:  1.5 inches Approach:  Anteromedial Ultrasound Guidance: No   Fluoroscopic Guidance: No   Arthrogram: No   Medications:  20 mg Sodium Hyaluronate 20 MG/2ML; 3 mL lidocaine 1 % Aspiration Attempted: No   Patient tolerance:  Patient tolerated the procedure well with no immediate complications     Clinical Data: No additional findings.   Subjective: No chief complaint on file.   HPI  Katie Kaiser is seen today for her third Euflexa injection. She states she is having good results so far. A nice reactivity.  Review of Systems   Objective: Vital Signs: LMP 05/05/1990   Physical Exam  Ortho Exam  Exam today reveals no warmth or erythema. Trace effusion.  Specialty Comments:  No specialty comments available.  Imaging: No results found.   PMFS History: Patient Active Problem List   Diagnosis Date Noted  . Unilateral primary osteoarthritis, left knee  04/02/2016  . Primary osteoarthritis of right knee 11/27/2015  . S/P total knee replacement using cement 11/27/2015  . Overactive bladder 11/15/2015  . Essential hypertension 04/17/2015  . Atherosclerosis of both carotid arteries 03/14/2015  . Advance care planning 08/21/2014  . Vitamin D deficiency 06/08/2013  . Depression, major, in remission ([place] [facility]) 06/08/2013  . Obesity, Class III, BMI 40-49.9 (morbid obesity) ([place]) 02/04/2011  . Hypothyroidism 10/21/2010   Past Medical History:  Diagnosis Date  . Arthritis   . Depression   . Diverticulosis   . Essential hypertension   . GERD (gastroesophageal reflux disease)   . Heart murmur    slight murmur per pt  . Hypothyroid   . Impaired fasting glucose 5/07  . Kidney stone   . OAB (overactive bladder)   . Obesity   . Pneumonia   . Vitamin D deficiency     Family History  Problem Relation Age of Onset  . Parkinsonism Mother   . Diabetes Brother   . Heart disease Brother        dx'd in 52's  . HIV Brother   . Arthritis Sister        rheumatoid  . Arthritis Sister        rheumatoid  . Cancer Neg Hx   . Colon cancer Neg Hx   . Stomach cancer Neg Hx     Past  Surgical History:  Procedure Laterality Date  . APPENDECTOMY    . CESAREAN SECTION    . COLONOSCOPY  02/04/2011   diverticulosis  . ORIF FOREARM FRACTURE  2002  . THYROIDECTOMY  1975  . TONSILLECTOMY    . 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 Smoking status: Former Smoker    Quit date: 05/05/1978  . Smokeless tobacco: Never Used  . Alcohol use 0.0 oz/week     Comment: a glass of wine or beer once or twice a month or less  . Drug use: No  . Sexual activity: Not Currently

## 2016-12-05 ENCOUNTER — Other Ambulatory Visit: Payer: Self-pay | Admitting: Family Medicine

## 2016-12-05 DIAGNOSIS — I10 Essential (primary) hypertension: Secondary | ICD-10-CM

## 2016-12-16 ENCOUNTER — Other Ambulatory Visit: Payer: Self-pay | Admitting: Family Medicine

## 2016-12-16 DIAGNOSIS — F325 Major depressive disorder, single episode, in full remission: Secondary | ICD-10-CM

## 2016-12-16 NOTE — Telephone Encounter (Signed)
Has wellness visit scheduled in October

## 2016-12-16 NOTE — Telephone Encounter (Signed)
Is this okay to refill? 

## 2016-12-17 ENCOUNTER — Telehealth: Payer: Self-pay

## 2016-12-17 ENCOUNTER — Other Ambulatory Visit: Payer: Self-pay | Admitting: *Deleted

## 2016-12-17 DIAGNOSIS — E039 Hypothyroidism, unspecified: Secondary | ICD-10-CM

## 2016-12-17 MED ORDER — SYNTHROID 200 MCG PO TABS: ORAL_TABLET | ORAL | 0 refills | Status: DC

## 2016-12-17 NOTE — Telephone Encounter (Signed)
Patient called said [facility] needs new fax for rx . She needs some samples and asked if you would please call her back

## 2016-12-17 NOTE — Telephone Encounter (Signed)
Done

## 2017-02-06 IMAGING — CR DG CHEST 2V
2 series · 2 of 2 positions shown · non-contrast
Comparison: None.

CLINICAL DATA: Cough and fever.

EXAM:
CHEST  2 VIEW

[w chest pa]
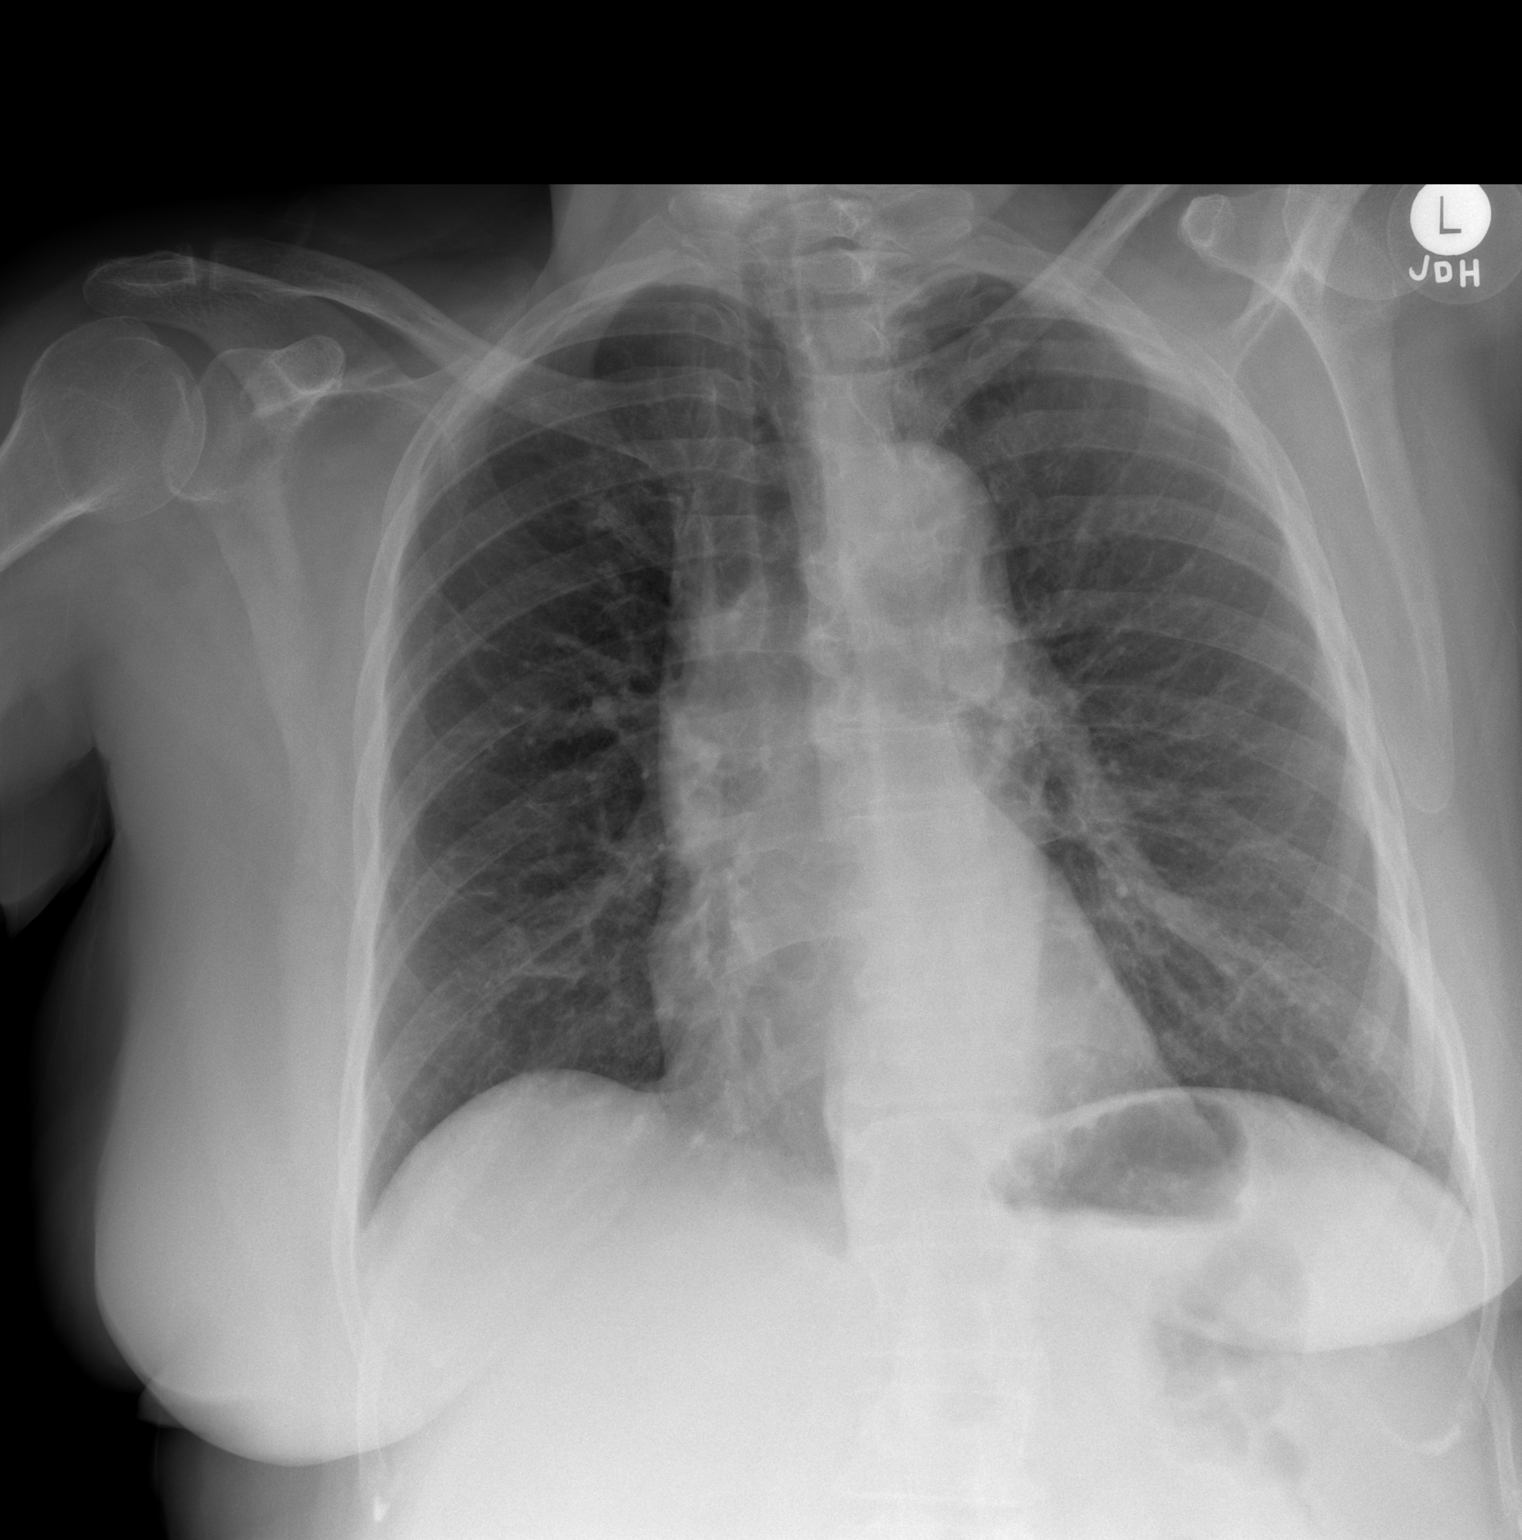

[w chest lat]
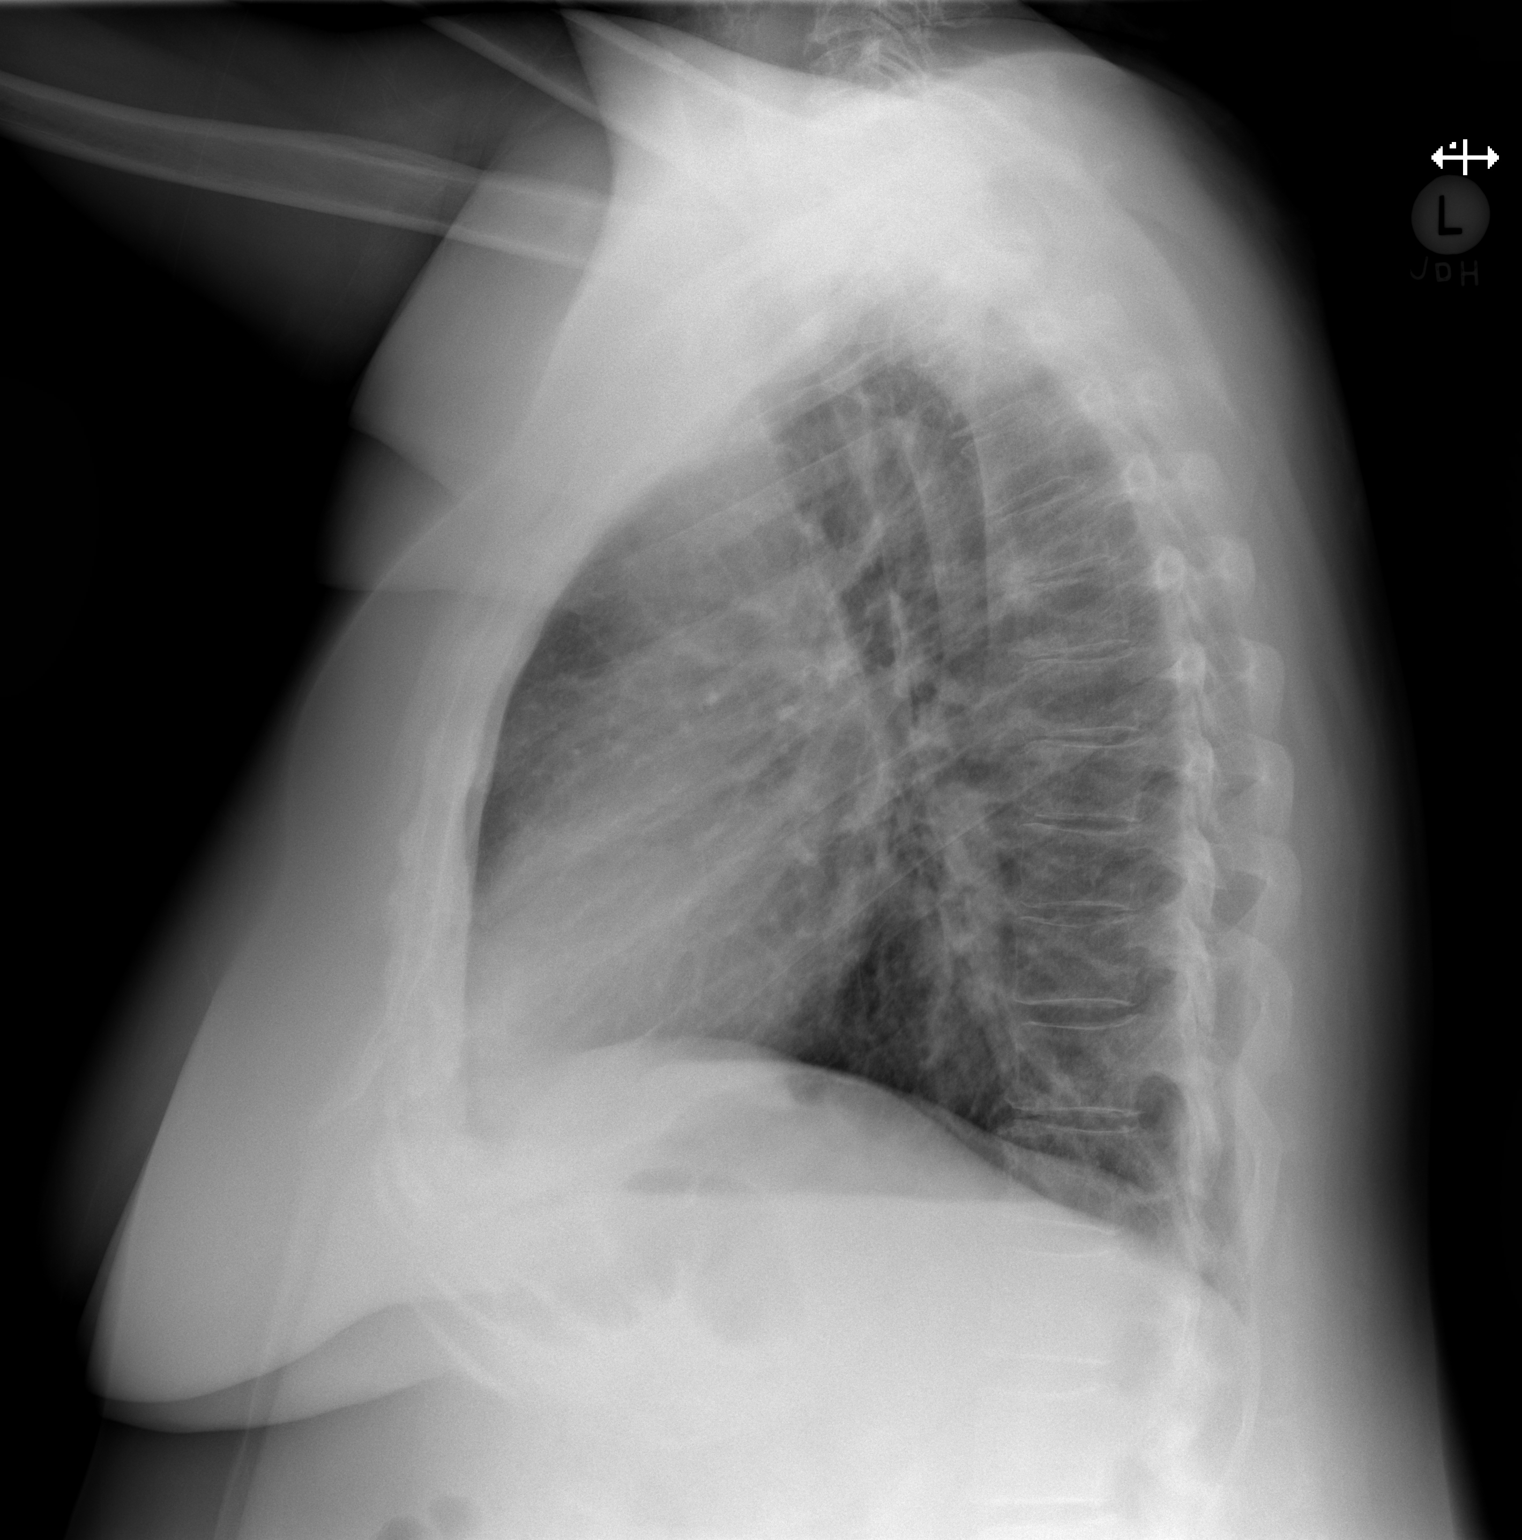

[2 of 2 positions shown; findings below may reference images not displayed]

FINDINGS: Mediastinum hilar structures normal. Left lung base subsegmental
atelectasis and or mild infiltrate cannot be excluded. No pleural
effusion or pneumothorax. Heart size normal. No acute bony
abnormality.
IMPRESSION: Mild left lung base subsegmental atelectasis and or infiltrate
cannot be excluded.

## 2017-02-11 NOTE — Progress Notes (Signed)
Chief Complaint  Patient presents with  . Medicare Wellness    fasting AWV with pap (last pap 2015). Only concern is some lower left abdominal pain that she has had for many years- thinks it is diverticulitis and it makes her more aware of what she eats.     Katie Kaiser is a 71 y.o. female who presents for annual wellness visit and follow-up on chronic medical conditions.  She has the following concerns:  Intermittent LLQ pain x many years. Feels it more after eating a lot of salad, and when eating almonds.  No fever, blood in the stool. No significant change.  Not currently having discomfort.  Hypothyroidism:  Compliant with taking her medication on an empty stomach, separate from other medications. At her visit in March she had been aware of some diffuse hair thinning, but had also noticed some re-growth.  She is taking Biotin, and hair is back to normal. Also reported 6 months ago significant fatigue. A sleep study was ordered, but she never had it done. She stopped drinking tea in the evening. She feels like her energy is better now.  She feels refreshed in the morning, and no daytime somnolence. Labs at that time showed iron deficiency anemia, normal TSH.  HTN--BP at [employer] runs 130-140/70's-80.  Denies headaches, dizziness, chest pain, change in swelling in her feet.  Denies any muscle cramps or side effects. She is eating 1/2 banana daily.  Depression: Is well controlled on her current regimen. Just some intermittent problems sleeping, very sensitive to certain foods. Chocolate kept her awake last night.  No further back problems when she visits her son since she changed beds. No longer needs any flexeril.  Anemia--baseline appears to be Hg 11.3. Last check in March was still a little low, but improving.  She is not currently taking any iron. Lab Results  Component Value Date   WBC 8.3 07/21/2016   HGB 10.9 (L) 07/21/2016   HCT 34.3 (L) 07/21/2016   MCV 74.9 (L)  07/21/2016   PLT 252 07/21/2016   Lab Results  Component Value Date   IRON 24 (L) 07/21/2016   FERRITIN 16 (L) 07/21/2016    Immunization History  Administered Date(s) Administered  . Influenza Split 02/01/2012, 01/03/2013, 03/03/2014, 03/03/2014  . Influenza Whole 03/16/2011  . Influenza, High Dose Seasonal PF 03/14/2015, 03/19/2016  . Pneumococcal Conjugate-13 06/08/2013  . Pneumococcal Polysaccharide-23 03/03/2014  . Td 10/09/1997  . Tdap 08/02/2007, 03/16/2013  . Zoster 03/17/2011   Last Pap smear: 06/2013, no high risk HPV detected Last mammogram: 08/2010 (never went last year as recommended) Last colonoscopy: 02/2011 Last DEXA: had lifeline screening a few years ago. Never had full DEXA. It has been recommended since 06/2013 and written rx was given to pt to get [facility].  She still hasn't gotten this done Dentist: twice yearly Ophtho: last was 3 years ago Exercise:  Just bought a recumbent bike, getting delivered this week. No regular exercise recently Lipids: Lab Results  Component Value Date   CHOL 173 10/18/2015   HDL 56 10/18/2015   LDLCALC 105 10/18/2015   TRIG 58 10/18/2015   CHOLHDL 3.1 10/18/2015  Vitamin D screen normal at 50 in 10/2015   Other doctors caring for patient include: Ophtho:Dr. Nicki Reaper at [facility] Dentist: [facility] Dermatologist: Dr. Allyson Sabal GI: Dr. Carlean Purl Ortho: [facility] (sees Dr. Rudene Anda PA, Biagio Borg)   Depression screen: negative Fall screen: [place] once within 3 weeks of her knee  surgery--tripped over her other foot while at her son's house.  No injury Function Status screen: notable for only for leakage of urine   End of Life Discussion:  Patient does not have a living will and medical power of attorney.  She reports her son never wants to talk about. He has the forms.  Given new ones. Discussed that while a conversation with her son is a good idea, so he is aware of her wishes, that his input is  not needed on the forms.  Past Medical History:  Diagnosis Date  . Arthritis   . Depression   . Diverticulosis   . Essential hypertension   . GERD (gastroesophageal reflux disease)   . Heart murmur    slight murmur per pt  . Hypothyroid   . Impaired fasting glucose 5/07  . Kidney stone   . OAB (overactive bladder)   . Obesity   . Pneumonia   . Vitamin D deficiency     Past Surgical History:  Procedure Laterality Date  . APPENDECTOMY    . CESAREAN SECTION    . COLONOSCOPY  02/04/2011   diverticulosis  . ORIF FOREARM FRACTURE  2002  . THYROIDECTOMY  1975  . TONSILLECTOMY    . TOTAL KNEE ARTHROPLASTY Right 11/27/2015   Procedure: TOTAL KNEE ARTHROPLASTY;  Surgeon: Garald Balding, MD;  Location: [facility];  Service: Orthopedics;  Laterality: Right;    Social History   Social History  . Marital status: Single    Spouse name: N/A  . Number of children: 1  . Years of education: N/A   Occupational History  . hairdresser [employer]   Social History Main Topics  . Smoking status: Former Smoker    Quit date: 05/05/1978  . Smokeless tobacco: Never Used  . Alcohol use 0.0 oz/week     Comment: a glass of wine or beer once or twice a month or less  . Drug use: No  . Sexual activity: Not Currently   Other Topics Concern  . Not on file   Social History Narrative   Lives alone; has a friend subletting her downstairs. 3 dogs. Son lives in [place], [place] granddaughters.   Hair dresser    Family History  Problem Relation Age of Onset  . Parkinsonism Mother   . Diabetes Brother   . Heart disease Brother        dx'd in 8's  . HIV Brother   . Arthritis Sister        rheumatoid  . Arthritis Sister        rheumatoid  . Cancer Neg Hx   . Colon cancer Neg Hx   . Stomach cancer Neg Hx     Outpatient Encounter Prescriptions as of 02/12/2017  Medication Sig  . aspirin EC 81 MG tablet Take 81 mg by mouth daily.  Marland Kitchen buPROPion (WELLBUTRIN XL) 300 MG 24 hr tablet Take 1  tablet (300 mg total) by mouth daily.  . Cholecalciferol (VITAMIN D) 1000 UNITS capsule Take 1,000 Units by mouth daily.    Marland Kitchen esomeprazole (NEXIUM) 20 MG capsule Take 20 mg by mouth daily at 12 noon.  Marland Kitchen FLUoxetine (PROZAC) 40 MG capsule TAKE ONE CAPSULE BY MOUTH ONCE DAILY  . hydrochlorothiazide (HYDRODIURIL) 25 MG tablet TAKE ONE TABLET BY MOUTH ONCE DAILY  . SYNTHROID 200 MCG tablet TAKE 1 TABLET (200 MCG) DAILY BEFORE BREAKFAST  . [DISCONTINUED] buPROPion (WELLBUTRIN XL) 300 MG 24 hr tablet TAKE ONE TABLET BY MOUTH ONCE DAILY  No facility-administered encounter medications on file as of 02/12/2017.     Allergies  Allergen Reactions  . Dilaudid [Hydromorphone Hcl]     Lots of itching     ROS: The patient denies anorexia, fever, headaches, vision changes, decreased hearing, ear pain, sore throat, breast concerns, chest pain, palpitations, dizziness, syncope, dyspnea on exertion, cough, swelling, nausea, vomiting, diarrhea, constipation, melena, hematochezia, indigestion/heartburn, hematuria, dysuria, vaginal bleeding, discharge, odor or itch, genital lesions, numbness, tingling, weakness, tremor, suspicious skin lesions, abnormal bleeding/bruising, or enlarged lymph nodes. Depression is well controlled. Intermittent LLQ discomfort per HPI. Some leakage of urine/dribbling, even after voiding.   PHYSICAL EXAM:  BP 138/84 (BP Location: Left Arm, Patient Position: Sitting, Cuff Size: Normal)   Pulse 80   Ht _0  (1.626 m)   Wt 245 lb (111.1 kg)   LMP 05/05/1990   BMI 42.05 kg/m   Wt Readings from Last 3 Encounters:  02/12/17 245 lb (111.1 kg)  07/22/16 250 lb (113.4 kg)  07/21/16 238 lb (108 kg)     General Appearance:   Alert, cooperative, no distress, appears stated age  Head:   Normocephalic, without obvious abnormality, atraumatic  Eyes:   PERRL, conjunctiva/corneas clear, EOM's intact, fundi benign  Ears:   Normal TM's and external ear canals.   Nose:  Nares normal, mucosa normal, no drainage or sinus tenderness  Throat:  Lips, mucosa, and tongue normal; teeth and gums normal  Neck:  Supple, no lymphadenopathy; thyroid: no enlargement/tenderness/nodules; no carotid bruit or JVD  Back:  Spine nontender, no curvature, ROM normal, no CVAtenderness  Lungs:   Clear to auscultation bilaterally without wheezes, rales or ronchi; respirations unlabored  Chest Wall:   No tenderness or deformity  Heart:   Regular rate and rhythm, S1 and S2 normal, no rub  or gallop. 3/6 SEM heard loudest at RUSB, louder/harsher than in past  Breast Exam:   No tenderness, masses, or nipple discharge or inversion. No axillary lymphadenopathy  Abdomen:   Soft, non-tender, nondistended, normoactive bowel sounds,   no masses, no hepatosplenomegaly  Genitalia:   Normal external genitalia without lesions; atrophic changes noted. BUS and vagina normal; no cervical motion tenderness. No abnormal vaginal discharge. Uterus and adnexa not enlarged, nontender, no masses.Cervix without lesions. Pap performed  Rectal:   Normal tone, no masses or tenderness; guaiac negative stool  Extremities:  No clubbing, cyanosis or edema. Very slight swelling at superolateral right knee. Nontender.  WHSS.  Pulses:  2+ and symmetric all extremities  Skin:  Skin color, texture, turgor normal, no rashes or lesions  Lymph nodes:  Cervical, supraclavicular, and axillary nodes normal  Neurologic:  CNII-XII intact, normal strength, sensation and gait; reflexes 2+ and symmetric throughout   Psych: Normal mood, affect, hygiene and grooming   PHQ-9 score of 2, over-eats and some intermittent sleeping problems (chronic).  ASSESSMENT/PLAN:  Medicare annual wellness visit, subsequent  Hypothyroidism, unspecified type - Plan: TSH  Essential hypertension - borderline control;  encouraged low sodium diet, exercise, weight loss.  Plans to start recumbent bike daily this week  Depression, major, in remission ([place]) - Plan: buPROPion (WELLBUTRIN XL) 300 MG 24 hr tablet  Need for influenza vaccination - Plan: Flu vaccine HIGH DOSE PF (Fluzone High dose)  Impaired fasting glucose - daily exercise and weight loss encouraged. Proper diet reviewed - Plan: Hemoglobin A1c  Iron deficiency anemia, unspecified iron deficiency anemia type - Plan: CBC with Differential/Platelet, Ferritin  Medication monitoring encounter - Plan: Comprehensive metabolic panel, CBC  with Differential/Platelet, TSH  Encounter for well woman exam with routine gynecological exam - Plan: Cytology - PAP [place]  Heart murmur - louder than in past. Check echocardiogram - Plan: ECHOCARDIOGRAM COMPLETE   Discussed monthly self breast exams and yearly mammograms (she is past due and reminded to schedule); at least 30 minutes of aerobic activity at least 5 days/week, weight-bearing exercise at least 2x/week; proper sunscreen use reviewed; healthy diet, including goals of calcium and vitamin D intake and alcohol recommendations (less than or equal to 1 drink/day) reviewed; regular seatbelt use; changing batteries in smoke detectors. Immunization recommendations discussed--high dose flu shot given. Shingrix recommended. Colonoscopy recommendations reviewed, UTD.  DEXA recommended (at each wellnes visit, never done)--encouraged to schedule along with her mammogram. Pap performed  Instructed on proper technique for Kegels to help with her leakage of urine, as well as trying to press on bladder and lean forward at end, to fully empty.   CBC, ferritin, TSH c-met, A1c (not completely fasting today, had some cream/sugar in coffee).   Full Code, Full Care Encouraged to complete living will and healthcare POA forms, given new ones.   Please call [facility] to schedule your routine mammogram, and also bone  density test (they will need to fax me an order to sign). Please schedule routine eye exam.    Medicare Attestation I have personally reviewed: The patient's medical and social history Their use of alcohol, tobacco or illicit drugs Their current medications and supplements The patient's functional ability including ADLs,fall risks, home safety risks, cognitive, and hearing and visual impairment Diet and physical activities Evidence for depression or mood disorders  The patient's weight, height, and BMI have been recorded in the chart.  I have made referrals, counseling, and provided education to the patient based on review of the above and I have provided the patient with a written personalized care plan for preventive services.

## 2017-02-12 ENCOUNTER — Ambulatory Visit (INDEPENDENT_AMBULATORY_CARE_PROVIDER_SITE_OTHER): Payer: Medicare Other | Admitting: Family Medicine

## 2017-02-12 ENCOUNTER — Encounter: Payer: Self-pay | Admitting: Family Medicine

## 2017-02-12 ENCOUNTER — Other Ambulatory Visit (HOSPITAL_COMMUNITY)
Admission: RE | Admit: 2017-02-12 | Discharge: 2017-02-12 | Disposition: A | Discharge status: Home / Self Care | Payer: Medicare Other | Source: Ambulatory Visit | Attending: Family Medicine | Admitting: Family Medicine

## 2017-02-12 VITALS — BP 138/84 | HR 80 | Ht 64.0 in | Wt 245.0 lb

## 2017-02-12 DIAGNOSIS — I10 Essential (primary) hypertension: Secondary | ICD-10-CM

## 2017-02-12 DIAGNOSIS — Z5181 Encounter for therapeutic drug level monitoring: Secondary | ICD-10-CM

## 2017-02-12 DIAGNOSIS — Z0000 Encounter for general adult medical examination without abnormal findings: Secondary | ICD-10-CM | POA: Diagnosis not present

## 2017-02-12 DIAGNOSIS — E039 Hypothyroidism, unspecified: Secondary | ICD-10-CM | POA: Diagnosis not present

## 2017-02-12 DIAGNOSIS — Z23 Encounter for immunization: Secondary | ICD-10-CM | POA: Diagnosis not present

## 2017-02-12 DIAGNOSIS — Z01419 Encounter for gynecological examination (general) (routine) without abnormal findings: Secondary | ICD-10-CM

## 2017-02-12 DIAGNOSIS — R7301 Impaired fasting glucose: Secondary | ICD-10-CM

## 2017-02-12 DIAGNOSIS — D509 Iron deficiency anemia, unspecified: Secondary | ICD-10-CM | POA: Diagnosis not present

## 2017-02-12 DIAGNOSIS — Z779 Other contact with and (suspected) exposures hazardous to health: Secondary | ICD-10-CM

## 2017-02-12 DIAGNOSIS — F325 Major depressive disorder, single episode, in full remission: Secondary | ICD-10-CM | POA: Diagnosis not present

## 2017-02-12 DIAGNOSIS — R011 Cardiac murmur, unspecified: Secondary | ICD-10-CM | POA: Diagnosis not present

## 2017-02-12 MED ORDER — BUPROPION HCL ER (XL) 300 MG PO TB24: 300.0000 mg | ORAL_TABLET | Freq: Every day | ORAL | 3 refills | Status: DC

## 2017-02-12 NOTE — Patient Instructions (Addendum)
HEALTH MAINTENANCE RECOMMENDATIONS:  It is recommended that you get at least 30 minutes of aerobic exercise at least 5 days/week (for weight loss, you may need as much as 60-90 minutes). This can be any activity that gets your heart rate up. This can be divided in 10-15 minute intervals if needed, but try and build up your endurance at least once a week.  Weight bearing exercise is also recommended twice weekly.  Eat a healthy diet with lots of vegetables, fruits and fiber.  "Colorful" foods have a lot of vitamins (ie green vegetables, tomatoes, red peppers, etc).  Limit sweet tea, regular sodas and alcoholic beverages, all of which has a lot of calories and sugar.  Up to 1 alcoholic drink daily may be beneficial for women (unless trying to lose weight, watch sugars).  Drink a lot of water.  Calcium recommendations are 1200-1500 mg daily (1500 mg for postmenopausal women or women without ovaries), and vitamin D 1000 IU daily.  This should be obtained from diet and/or supplements (vitamins), and calcium should not be taken all at once, but in divided doses.  Monthly self breast exams and yearly mammograms for women over the age of 74 is recommended.  Sunscreen of at least SPF 30 should be used on all sun-exposed parts of the skin when outside between the hours of 10 am and 4 pm (not just when at beach or pool, but even with exercise, golf, tennis, and yard work!)  Use a sunscreen that says "broad spectrum" so it covers both UVA and UVB rays, and make sure to reapply every 1-2 hours.  Remember to change the batteries in your smoke detectors when changing your clock times in the spring and fall.  Use your seat belt every time you are in a car, and please drive safely and not be distracted with cell phones and texting while driving.   Katie Kaiser , Thank you for taking time to come for your Medicare Wellness Visit. I appreciate your ongoing commitment to your health goals. Please review the following  plan we discussed and let me know if I can assist you in the future.   These are the goals we discussed: Goals    None      This is a list of the screening recommended for you and due dates:  Health Maintenance  Topic Date Due  . Mammogram  08/25/2012  . DEXA scan (bone density measurement)  12/16/2012  . Flu Shot  12/03/2016  . Colon Cancer Screening  02/03/2021  . Tetanus Vaccine  03/17/2023  .  Hepatitis C: One time screening is recommended by Center for Disease Control  (CDC) for  adults born from [birth year] through 1963.   Completed  . Pneumonia vaccines  Completed   Please call [facility] to schedule BOTH your mammogram and bone density test (they will need to fax me an order for the bone test).  Please schedule a routine eye exam.  You got your flu shot today.  We did a pap smear today.  I recommend getting the new shingles vaccine (Shingrix). You will need to check with your insurance to see if it is covered, and if covered by Medicare Part D, you need to get from the pharmacy rather than our office.  It is a series of 2 injections, spaced 2 months apart.  Goal blood pressure is <130/80. Hopefully with regular exercise, weight loss, and low sodium diet you will see your numbers improve. If they remain >130-135/80-85 we  may need to adjust your medications.   DASH Eating Plan DASH stands for "Dietary Approaches to Stop Hypertension." The DASH eating plan is a healthy eating plan that has been shown to reduce high blood pressure (hypertension). It may also reduce your risk for type 2 diabetes, heart disease, and stroke. The DASH eating plan may also help with weight loss. What are tips for following this plan? General guidelines  Avoid eating more than 2,300 mg (milligrams) of salt (sodium) a day. If you have hypertension, you may need to reduce your sodium intake to 1,500 mg a day.  Limit alcohol intake to no more than 1 drink a day for nonpregnant women and 2 drinks a day for men.  One drink equals 12 oz of beer, 5 oz of wine, or 1 oz of hard liquor.  Work with your health care provider to maintain a healthy body weight or to lose weight. Ask what an ideal weight is for you.  Get at least 30 minutes of exercise that causes your heart to beat faster (aerobic exercise) most days of the week. Activities may include walking, swimming, or biking.  Work with your health care provider or diet and nutrition specialist (dietitian) to adjust your eating plan to your individual calorie needs. Reading food labels  Check food labels for the amount of sodium per serving. Choose foods with less than 5 percent of the Daily Value of sodium. Generally, foods with less than 300 mg of sodium per serving fit into this eating plan.  To find whole grains, look for the word "whole" as the first word in the ingredient list. Shopping  Buy products labeled as "low-sodium" or "no salt added."  Buy fresh foods. Avoid canned foods and premade or frozen meals. Cooking  Avoid adding salt when cooking. Use salt-free seasonings or herbs instead of table salt or sea salt. Check with your health care provider or pharmacist before using salt substitutes.  Do not fry foods. Cook foods using healthy methods such as baking, boiling, grilling, and broiling instead.  Cook with heart-healthy oils, such as olive, canola, soybean, or sunflower oil. Meal planning   Eat a balanced diet that includes: ? 5 or more servings of fruits and vegetables each day. At each meal, try to fill half of your plate with fruits and vegetables. ? Up to 6-8 servings of whole grains each day. ? Less than 6 oz of lean meat, poultry, or fish each day. A 3-oz serving of meat is about the same size as a deck of cards. One egg equals 1 oz. ? 2 servings of low-fat dairy each day. ? A serving of nuts, seeds, or beans 5 times each week. ? Heart-healthy fats. Healthy fats called Omega-3 fatty acids are found in foods such as flaxseeds  and coldwater fish, like sardines, salmon, and mackerel.  Limit how much you eat of the following: ? Canned or prepackaged foods. ? Food that is high in trans fat, such as fried foods. ? Food that is high in saturated fat, such as fatty meat. ? Sweets, desserts, sugary drinks, and other foods with added sugar. ? Full-fat dairy products.  Do not salt foods before eating.  Try to eat at least 2 vegetarian meals each week.  Eat more home-cooked food and less restaurant, buffet, and fast food.  When eating at a restaurant, ask that your food be prepared with less salt or no salt, if possible. What foods are recommended? The items listed may not be  a complete list. Talk with your dietitian about what dietary choices are best for you. Grains Whole-grain or whole-wheat bread. Whole-grain or whole-wheat pasta. Brown rice. [place]. Bulgur. Whole-grain and low-sodium cereals. Pita bread. Low-fat, low-sodium crackers. Whole-wheat flour tortillas. Vegetables Fresh or frozen vegetables (raw, steamed, roasted, or grilled). Low-sodium or reduced-sodium tomato and vegetable juice. Low-sodium or reduced-sodium tomato sauce and tomato paste. Low-sodium or reduced-sodium canned vegetables. Fruits All fresh, dried, or frozen fruit. Canned fruit in natural juice (without added sugar). Meat and other protein foods Skinless chicken or [place]. Ground chicken or [place]. Pork with fat trimmed off. Fish and seafood. Egg whites. Dried beans, peas, or lentils. Unsalted nuts, nut butters, and seeds. Unsalted canned beans. Lean cuts of beef with fat trimmed off. Low-sodium, lean deli meat. Dairy Low-fat (1%) or fat-free (skim) milk. Fat-free, low-fat, or reduced-fat cheeses. Nonfat, low-sodium ricotta or cottage cheese. Low-fat or nonfat yogurt. Low-fat, low-sodium cheese. Fats and oils Soft margarine without trans fats. Vegetable oil. Low-fat, reduced-fat, or light mayonnaise and salad dressings  (reduced-sodium). Canola, safflower, olive, soybean, and sunflower oils. Avocado. Seasoning and other foods Herbs. Spices. Seasoning mixes without salt. Unsalted popcorn and pretzels. Fat-free sweets. What foods are not recommended? The items listed may not be a complete list. Talk with your dietitian about what dietary choices are best for you. Grains Baked goods made with fat, such as croissants, muffins, or some breads. Dry pasta or rice meal packs. Vegetables Creamed or fried vegetables. Vegetables in a cheese sauce. Regular canned vegetables (not low-sodium or reduced-sodium). Regular canned tomato sauce and paste (not low-sodium or reduced-sodium). Regular tomato and vegetable juice (not low-sodium or reduced-sodium). Angie Fava. Olives. Fruits Canned fruit in a light or heavy syrup. Fried fruit. Fruit in cream or butter sauce. Meat and other protein foods Fatty cuts of meat. Ribs. Fried meat. Berniece Salines. Sausage. Bologna and other processed lunch meats. Salami. Fatback. Hotdogs. Bratwurst. Salted nuts and seeds. Canned beans with added salt. Canned or smoked fish. Whole eggs or egg yolks. Chicken or [place] with skin. Dairy Whole or 2% milk, cream, and half-and-half. Whole or full-fat cream cheese. Whole-fat or sweetened yogurt. Full-fat cheese. Nondairy creamers. Whipped toppings. Processed cheese and cheese spreads. Fats and oils Butter. Stick margarine. Lard. Shortening. Ghee. Bacon fat. Tropical oils, such as coconut, palm kernel, or palm oil. Seasoning and other foods Salted popcorn and pretzels. Onion salt, garlic salt, seasoned salt, table salt, and sea salt. Worcestershire sauce. Tartar sauce. Barbecue sauce. Teriyaki sauce. Soy sauce, including reduced-sodium. Steak sauce. Canned and packaged gravies. Fish sauce. Oyster sauce. Cocktail sauce. Horseradish that you find on the shelf. Ketchup. Mustard. Meat flavorings and tenderizers. Bouillon cubes. Hot sauce and Tabasco sauce. Premade or  packaged marinades. Premade or packaged taco seasonings. Relishes. Regular salad dressings. Where to find more information:  National Heart, Lung, and [place]: https://wilson-eaton.com/  American Heart Association: www.heart.org Summary  The DASH eating plan is a healthy eating plan that has been shown to reduce high blood pressure (hypertension). It may also reduce your risk for type 2 diabetes, heart disease, and stroke.  With the DASH eating plan, you should limit salt (sodium) intake to 2,300 mg a day. If you have hypertension, you may need to reduce your sodium intake to 1,500 mg a day.  When on the DASH eating plan, aim to eat more fresh fruits and vegetables, whole grains, lean proteins, low-fat dairy, and heart-healthy fats.  Work with your health care provider or diet and nutrition specialist (  dietitian) to adjust your eating plan to your individual calorie needs. This information is not intended to replace advice given to you by your health care provider. Make sure you discuss any questions you have with your health care provider. Document Released: 04/10/2011 Document Revised: 04/14/2016 Document Reviewed: 04/14/2016 Elsevier Interactive Patient Education  2017 [employer].

## 2017-02-13 LAB — CBC WITH DIFFERENTIAL/PLATELET
BASOS PCT: 0.6 %
Basophils Absolute: 41 cells/uL (ref 0–200)
EOS ABS: 170 {cells}/uL (ref 15–500)
Eosinophils Relative: 2.5 %
HEMATOCRIT: 35.9 % (ref 35.0–45.0)
HEMOGLOBIN: 11.4 g/dL — AB (ref 11.7–15.5)
LYMPHS ABS: 1802 {cells}/uL (ref 850–3900)
MCH: 24.1 pg — ABNORMAL LOW (ref 27.0–33.0)
MCHC: 31.8 g/dL — ABNORMAL LOW (ref 32.0–36.0)
MCV: 75.7 fL — AB (ref 80.0–100.0)
MPV: 9.3 fL (ref 7.5–12.5)
Monocytes Relative: 7.9 %
NEUTROS ABS: 4250 {cells}/uL (ref 1500–7800)
Neutrophils Relative %: 62.5 %
PLATELETS: 259 10*3/uL (ref 140–400)
RBC: 4.74 10*6/uL (ref 3.80–5.10)
RDW: 15.2 % — ABNORMAL HIGH (ref 11.0–15.0)
TOTAL LYMPHOCYTE: 26.5 %
WBC: 6.8 10*3/uL (ref 3.8–10.8)
WBCMIX: 537 {cells}/uL (ref 200–950)

## 2017-02-13 LAB — COMPREHENSIVE METABOLIC PANEL
AG Ratio: 1.3 (calc) (ref 1.0–2.5)
ALT: 11 U/L (ref 6–29)
AST: 13 U/L (ref 10–35)
Albumin: 3.9 g/dL (ref 3.6–5.1)
Alkaline phosphatase (APISO): 96 U/L (ref 33–130)
BUN: 15 mg/dL (ref 7–25)
CHLORIDE: 100 mmol/L (ref 98–110)
CO2: 27 mmol/L (ref 20–32)
CREATININE: 0.71 mg/dL (ref 0.50–0.99)
Calcium: 9 mg/dL (ref 8.6–10.4)
GLOBULIN: 3 g/dL (ref 1.9–3.7)
GLUCOSE: 94 mg/dL (ref 65–99)
Potassium: 4.6 mmol/L (ref 3.5–5.3)
Sodium: 136 mmol/L (ref 135–146)
Total Bilirubin: 0.4 mg/dL (ref 0.2–1.2)
Total Protein: 6.9 g/dL (ref 6.1–8.1)

## 2017-02-13 LAB — HEMOGLOBIN A1C
HEMOGLOBIN A1C: 5.8 %{Hb} — AB (ref <5.7)
MEAN PLASMA GLUCOSE: 120 (calc)
eAG (mmol/L): 6.6 (calc)

## 2017-02-13 LAB — TSH: TSH: 0.6 mIU/L (ref 0.40–4.50)

## 2017-02-13 LAB — FERRITIN: Ferritin: 21 ng/mL (ref 20–288)

## 2017-02-16 LAB — CYTOLOGY - PAP
Diagnosis: NEGATIVE
HPV: NOT DETECTED

## 2017-02-18 ENCOUNTER — Other Ambulatory Visit: Payer: Self-pay

## 2017-02-18 ENCOUNTER — Encounter: Payer: Self-pay | Admitting: Family Medicine

## 2017-02-18 ENCOUNTER — Ambulatory Visit (HOSPITAL_COMMUNITY): Payer: Medicare Other | Attending: Cardiovascular Disease

## 2017-02-18 DIAGNOSIS — I350 Nonrheumatic aortic (valve) stenosis: Secondary | ICD-10-CM | POA: Insufficient documentation

## 2017-02-18 DIAGNOSIS — E669 Obesity, unspecified: Secondary | ICD-10-CM | POA: Insufficient documentation

## 2017-02-18 DIAGNOSIS — I119 Hypertensive heart disease without heart failure: Secondary | ICD-10-CM | POA: Diagnosis not present

## 2017-02-18 DIAGNOSIS — Z6841 Body Mass Index (BMI) 40.0 and over, adult: Secondary | ICD-10-CM | POA: Insufficient documentation

## 2017-02-18 DIAGNOSIS — R011 Cardiac murmur, unspecified: Secondary | ICD-10-CM | POA: Diagnosis not present

## 2017-02-21 ENCOUNTER — Other Ambulatory Visit: Payer: Self-pay | Admitting: Family Medicine

## 2017-02-21 DIAGNOSIS — E039 Hypothyroidism, unspecified: Secondary | ICD-10-CM

## 2017-02-22 DIAGNOSIS — B9789 Other viral agents as the cause of diseases classified elsewhere: Secondary | ICD-10-CM | POA: Diagnosis not present

## 2017-02-22 DIAGNOSIS — J069 Acute upper respiratory infection, unspecified: Secondary | ICD-10-CM | POA: Diagnosis not present

## 2017-02-22 DIAGNOSIS — I10 Essential (primary) hypertension: Secondary | ICD-10-CM | POA: Diagnosis not present

## 2017-02-24 ENCOUNTER — Ambulatory Visit (INDEPENDENT_AMBULATORY_CARE_PROVIDER_SITE_OTHER): Payer: Medicare Other | Admitting: Orthopaedic Surgery

## 2017-02-25 ENCOUNTER — Telehealth: Payer: Self-pay | Admitting: *Deleted

## 2017-02-25 NOTE — Telephone Encounter (Signed)
[place] for consult.  Reason is heart murmur, diastolic dysfunction, aortic stenosis.  Her biggest risk of heart issues is related to her borderline/high blood pressure values. This contributes to the thickening of the heart muscle and the stiffness and poor relaxation of the heart.  I didn't make changes to her blood pressure regimen at the time of her visit (before knowing this was present), as she was planning to start using her recumbent exercise bike daily, and was waiting to see if her BP's improved.  See if using the bike daily, what her BP's are running.  If they remain elevated, likely the cardiologist will adjust her medications to achieve BP of 120/70.

## 2017-02-25 NOTE — Telephone Encounter (Signed)
Went over patient's echo results with her this morning. She called back asking for a cardio consult, her son actually wants her to be seen by a cardiologist just for a second opinion. She stated that she is fine-she said her son is an "alarmist" and that she is the only mother that he has and he just wants to be doubly sure that she is okay. She is not having any symptoms except and occasional high bp reading from time to time.

## 2017-02-26 ENCOUNTER — Other Ambulatory Visit: Payer: Self-pay | Admitting: *Deleted

## 2017-02-26 DIAGNOSIS — I350 Nonrheumatic aortic (valve) stenosis: Secondary | ICD-10-CM

## 2017-02-26 DIAGNOSIS — R011 Cardiac murmur, unspecified: Secondary | ICD-10-CM

## 2017-02-26 DIAGNOSIS — I5189 Other ill-defined heart diseases: Secondary | ICD-10-CM

## 2017-02-26 NOTE — Telephone Encounter (Signed)
Left message for patient that I will put in referral for her and they will contact her soon. Also let her know what Dr.Knapp said.

## 2017-03-02 ENCOUNTER — Telehealth: Payer: Self-pay | Admitting: Family Medicine

## 2017-03-02 ENCOUNTER — Ambulatory Visit (INDEPENDENT_AMBULATORY_CARE_PROVIDER_SITE_OTHER): Payer: Medicare Other | Admitting: Orthopaedic Surgery

## 2017-03-02 ENCOUNTER — Encounter (INDEPENDENT_AMBULATORY_CARE_PROVIDER_SITE_OTHER): Payer: Self-pay | Admitting: Orthopaedic Surgery

## 2017-03-02 ENCOUNTER — Other Ambulatory Visit: Payer: Self-pay | Admitting: Family Medicine

## 2017-03-02 VITALS — BP 125/77 | HR 82 | Resp 14 | Ht 66.0 in | Wt 245.0 lb

## 2017-03-02 DIAGNOSIS — Z96651 Presence of right artificial knee joint: Secondary | ICD-10-CM

## 2017-03-02 DIAGNOSIS — I10 Essential (primary) hypertension: Secondary | ICD-10-CM

## 2017-03-02 NOTE — Telephone Encounter (Signed)
If it is her walking she is using as an excuse, that needs to come from her orthopedist. She has been referred to cardiologist in regards to a murmur (which is asymptomatic, and not affecting her with symptoms).

## 2017-03-02 NOTE — Telephone Encounter (Signed)
Pt called and stated she has received a Chief Operating Officer for December. She just doesn't feel like she can walk that far. She states she has been referred to a cardiologist. She would like a letter excusing her. Pt can be reached at  [phone].

## 2017-03-02 NOTE — Progress Notes (Signed)
Office Visit Note   Patient: Katie Kaiser           Date of Birth: 03-04-1946           MRN: [id number] Visit Date: 03/02/2017              Requested by: Rita Ohara, [address] [place], [place] [address] PCP: Rita Ohara, MD   Assessment & Plan: Visit Diagnoses:  1. History of total right knee replacement     Plan: No obvious problems with right knee replacement by exam. Encouraged Katie Kaiser to continue with her exercise regimen and particularly use of the exercise bike. We'll see back on a when necessary basis  Follow-Up Instructions: Return if symptoms worsen or fail to improve.   Orders:  No orders of the defined types were placed in this encounter.  No orders of the defined types were placed in this encounter.     Procedures: No procedures performed   Clinical Data: No additional findings.   Subjective: Chief Complaint  Patient presents with  . Right Knee - Pain    Ms. Katie Kaiser is a 71 y o with Right knee pain. Hx of R TKA October 28, 2015. She relates it has been painful x 1 month when walking. Swelling   15 months status post primary right total knee replacement. 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 Stands for long periods of time. Had been walking at the mall for exercises 3-4 times a week. Over the past 3-4 weeks has developed some pain along the lateral aspect of her right knee. No fever or chills or redness. No feeling of her knee giving way. She was concerned about implication of the discomfort. She stopped walking and started riding an exercise bike twice a day for 20 minutes without any pain. Presently very happy with her knee and is very thankful "you gave me my life back". She is working on her weight and wants to assure herself that she can be around for her grandchildren.   HPI  Review of Systems  Constitutional: Negative for chills, fatigue and fever.  HENT: Positive for sinus pain.   Eyes: Negative for itching.    Respiratory: Negative for chest tightness and shortness of breath.   Cardiovascular: Negative for chest pain, palpitations and leg swelling.  Gastrointestinal: Negative for blood in stool, constipation and diarrhea.  Endocrine: Negative for polyuria.  Genitourinary: Negative for dysuria.  Musculoskeletal: Positive for joint swelling. Negative for back pain, neck pain and neck stiffness.  Allergic/Immunologic: Negative for immunocompromised state.  Neurological: Negative for dizziness and numbness.  Hematological: Does not bruise/bleed easily.  Psychiatric/Behavioral: The patient is not nervous/anxious.      Objective: Vital Signs: BP 125/77   Pulse 82   Resp 14   Ht 5\' 6"  (1.676 m)   Wt 245 lb (111.1 kg)   LMP 05/05/1990   BMI 39.54 kg/m   Physical Exam  Ortho Exam awake alert and oriented 3. Comfortable sitting. Demonstrated ambulation without any limp. Painless range of motion of both hips. Straight leg raise negative. Right knee was not effused. No erythema or ecchymosis or obvious swelling. Stability. No localized tenderness to today. Full extension and flexion about 115. No popliteal pain. No calf discomfort. Neurovascular exam intact distally.  Specialty Comments:  No specialty comments available.  Imaging: No results found.   PMFS History: Patient Active Problem List   Diagnosis Date Noted  . Aortic stenosis, mild 02/18/2017  .  Unilateral primary osteoarthritis, left knee 04/02/2016  . Primary osteoarthritis of right knee 11/27/2015  . S/P total knee replacement using cement 11/27/2015  . Overactive bladder 11/15/2015  . Essential hypertension 04/17/2015  . Atherosclerosis of both carotid arteries 03/14/2015  . Advance care planning 08/21/2014  . Vitamin D deficiency 06/08/2013  . Depression, major, in remission ([facility]) 06/08/2013  . Obesity, Class III, BMI 40-49.9 (morbid obesity) ([facility]) 02/04/2011  . Hypothyroidism 10/21/2010   Past Medical History:   Diagnosis Date  . Arthritis   . Depression   . Diverticulosis   . Essential hypertension   . GERD (gastroesophageal reflux disease)   . Heart murmur    slight murmur per pt  . Hypothyroid   . Impaired fasting glucose 5/07  . Kidney stone   . OAB (overactive bladder)   . Obesity   . Pneumonia   . Vitamin D deficiency     Family History  Problem Relation Age of Onset  . Parkinsonism Mother   . Diabetes Brother   . Heart disease Brother        dx'd in 52's  . HIV Brother   . Arthritis Sister        rheumatoid  . Arthritis Sister        rheumatoid  . Cancer Neg Hx   . Colon cancer Neg Hx   . Stomach cancer Neg Hx     Past Surgical History:  Procedure Laterality Date  . APPENDECTOMY    . CESAREAN SECTION    . COLONOSCOPY  02/04/2011   diverticulosis  . ORIF FOREARM FRACTURE  2002  . THYROIDECTOMY  1975  . TONSILLECTOMY    . TOTAL KNEE ARTHROPLASTY Right 11/27/2015   Procedure: TOTAL KNEE ARTHROPLASTY;  Surgeon: Garald Balding, MD;  Location: [facility];  Service: Orthopedics;  Laterality: Right;   Social History   Occupational History  . hairdresser [employer]   Social History Main Topics  . Smoking status: Former Smoker    Quit date: 05/05/1978  . Smokeless tobacco: Never Used  . Alcohol use 0.0 oz/week     Comment: a glass of wine or beer once or twice a month or less  . Drug use: No  . Sexual activity: Not Currently

## 2017-03-04 NOTE — Telephone Encounter (Signed)
Pt informed

## 2017-03-09 ENCOUNTER — Ambulatory Visit (INDEPENDENT_AMBULATORY_CARE_PROVIDER_SITE_OTHER): Payer: Medicare Other | Admitting: Interventional Cardiology

## 2017-03-09 ENCOUNTER — Encounter: Payer: Self-pay | Admitting: Interventional Cardiology

## 2017-03-09 VITALS — BP 150/96 | HR 75 | Ht 66.0 in | Wt 243.8 lb

## 2017-03-09 DIAGNOSIS — E669 Obesity, unspecified: Secondary | ICD-10-CM | POA: Diagnosis not present

## 2017-03-09 DIAGNOSIS — I350 Nonrheumatic aortic (valve) stenosis: Secondary | ICD-10-CM | POA: Diagnosis not present

## 2017-03-09 DIAGNOSIS — I10 Essential (primary) hypertension: Secondary | ICD-10-CM

## 2017-03-09 MED ORDER — LISINOPRIL 10 MG PO TABS: 10.0000 mg | ORAL_TABLET | Freq: Every day | ORAL | 3 refills | Status: DC

## 2017-03-09 NOTE — Progress Notes (Signed)
Cardiology Office Note   Date:  03/09/2017   ID:  Katie Kaiser, DOB 1946/04/05, MRN [id number]  PCP:  Rita Ohara, MD    No chief complaint on file.  Aortic stenosis  Wt Readings from Last 3 Encounters:  03/09/17 243 lb 12.8 oz (110.6 kg)  03/02/17 245 lb (111.1 kg)  02/12/17 245 lb (111.1 kg)       History of Present Illness: Katie Kaiser is a 71 y.o. female who is being seen today for the evaluation of aortic stenosis at the request of Rita Ohara, MD.   She has had a murmur for the last 20 years.  She recently had an echo and is here to discuss the result.    No smoking history.  Brother had ventricular tachycardia and passed away.  Unknown what the circumstances were.  Sister died from complications of valve surgery many years ago.    HCTZ was started 2 years ago for high BP.    Denies : Chest pain. Dizziness. Leg edema. Nitroglycerin use. Orthopnea. Palpitations. Paroxysmal nocturnal dyspnea. Shortness of breath. Syncope.       Past Medical History:  Diagnosis Date  . Arthritis   . Depression   . Diverticulosis   . Essential hypertension   . GERD (gastroesophageal reflux disease)   . Heart murmur    slight murmur per pt  . Hypothyroid   . Impaired fasting glucose 5/07  . Kidney stone   . OAB (overactive bladder)   . Obesity   . Pneumonia   . Vitamin D deficiency     Past Surgical History:  Procedure Laterality Date  . APPENDECTOMY    . CESAREAN SECTION    . COLONOSCOPY  02/04/2011   diverticulosis  . ORIF FOREARM FRACTURE  2002  . THYROIDECTOMY  1975  . TONSILLECTOMY       Current Outpatient Medications  Medication Sig Dispense Refill  . aspirin EC 81 MG tablet Take 81 mg by mouth daily.    Marland Kitchen buPROPion (WELLBUTRIN XL) 300 MG 24 hr tablet Take 1 tablet (300 mg total) by mouth daily. 90 tablet 3  . Cholecalciferol (VITAMIN D) 1000 UNITS capsule Take 1,000 Units by mouth daily.      Marland Kitchen esomeprazole (NEXIUM) 20 MG capsule Take 20 mg  by mouth daily at 12 noon.    Marland Kitchen FLUoxetine (PROZAC) 40 MG capsule TAKE ONE CAPSULE BY MOUTH ONCE DAILY 90 capsule 0  . hydrochlorothiazide (HYDRODIURIL) 25 MG tablet TAKE 1 TABLET BY MOUTH ONCE DAILY 90 tablet 1  . Omega-3 Fatty Acids (FISH OIL) 1000 MG CAPS Take 2 mg daily by mouth.    . SYNTHROID 200 MCG tablet TAKE 1 TABLET DAILY BEFORE BREAKFAST 90 tablet 1   No current facility-administered medications for this visit.     Allergies:   Dilaudid [hydromorphone hcl]    Social History:  The patient  reports that she quit smoking about 38 years ago. she has never used smokeless tobacco. She reports that she drinks alcohol. She reports that she does not use drugs.   Family History:  The patient's family history includes Arthritis in her sister and sister; Diabetes in her brother; HIV in her brother; Heart disease in her brother; Parkinsonism in her mother.    ROS:  Please see the history of present illness.   Otherwise, review of systems are positive for weight gain of late- starting exercise.   All other systems are reviewed and negative.  PHYSICAL EXAM: VS:  BP (!) 150/96 (BP Location: Left Arm, Patient Position: Sitting, Cuff Size: Large)   Pulse 75   Ht 5\' 6"  (1.676 m)   Wt 243 lb 12.8 oz (110.6 kg)   LMP 05/05/1990   SpO2 98%   BMI 39.35 kg/m  , BMI Body mass index is 39.35 kg/m. GEN: Well nourished, well developed, in no acute distress  HEENT: normal  Neck: no JVD, carotid bruits, or masses Cardiac: RRR; no murmurs, rubs, or gallops,no edema  Respiratory:  clear to auscultation bilaterally, normal work of breathing GI: soft, nontender, nondistended, + BS MS: no deformity or atrophy  Skin: warm and dry, no rash Neuro:  Strength and sensation are intact Psych: euthymic mood, full affect   EKG:   The ekg ordered today demonstrates normal ECG   Recent Labs: 07/21/2016: Magnesium 1.8 02/12/2017: ALT 11; BUN 15; Creat 0.71; Hemoglobin 11.4; Platelets 259; Potassium  4.6; Sodium 136; TSH 0.60   Lipid Panel    Component Value Date/Time   CHOL 173 10/18/2015 0909   TRIG 58 10/18/2015 0909   HDL 56 10/18/2015 0909   CHOLHDL 3.1 10/18/2015 0909   VLDL 12 10/18/2015 0909   LDLCALC 105 10/18/2015 0909     Other studies Reviewed: Additional studies/ records that were reviewed today with results demonstrating: .   ASSESSMENT AND PLAN:  1. Aortic stenosis: Mild by echo.  No sx of ssevere AS.  No need for SBE prophylaxis.   2. HTN: Elevated. Start lisinopril 10 mg daily.  BMet in one week.  F/u in HTN clinic.    3. Obesity: She is working on losing weight.    Current medicines are reviewed at length with the patient today.  The patient concerns regarding her medicines were addressed.  The following changes have been made:  Add lisinopril  Labs/ tests ordered today include:  No orders of the defined types were placed in this encounter.   Recommend 150 minutes/week of aerobic exercise Low fat, low carb, high fiber diet recommended  Disposition:   FU in HTN clinic in a few weeks; then 1 year for me   Signed, Larae Grooms, MD  03/09/2017 3:06 [facility] [address], [place], [place]  [address] Phone: [phone]; Fax: [phone]

## 2017-03-09 NOTE — Patient Instructions (Signed)
Medication Instructions:  Your physician has recommended you make the following change in your medication:   START: Lisinopril 10 mg daily  Labwork: Your physician recommends that you return for lab work in: 1 week for BMET   Testing/Procedures: None ordered  Follow-Up: You have been referred to the Hypertension Clinic for Blood Pressure Management. You will need to schedule an appointment in 2 weeks or first available.   Your physician wants you to follow-up in: 1 year with Dr. Irish Lack. You will receive a reminder letter in the mail two months in advance. If you don't receive a letter, please call our office to schedule the follow-up appointment.   Any Other Special Instructions Will Be Listed Below (If Applicable).     If you need a refill on your cardiac medications before your next appointment, please call your pharmacy.

## 2017-03-11 ENCOUNTER — Telehealth: Payer: Self-pay

## 2017-03-11 NOTE — Telephone Encounter (Signed)
Patient would like a call concerning her right knee.  Stated that she is not able to walk a long distance.  Cb# is [phone].  Please advise.  Thank you.

## 2017-03-13 ENCOUNTER — Encounter (INDEPENDENT_AMBULATORY_CARE_PROVIDER_SITE_OTHER): Payer: Self-pay

## 2017-03-13 NOTE — Telephone Encounter (Signed)
Spoke with pt and left note at [facility]

## 2017-03-18 ENCOUNTER — Other Ambulatory Visit: Payer: Medicare Other

## 2017-03-18 DIAGNOSIS — I10 Essential (primary) hypertension: Secondary | ICD-10-CM | POA: Diagnosis not present

## 2017-03-19 LAB — BASIC METABOLIC PANEL
BUN/Creatinine Ratio: 24 (ref 12–28)
BUN: 17 mg/dL (ref 8–27)
CALCIUM: 9.4 mg/dL (ref 8.7–10.3)
CHLORIDE: 99 mmol/L (ref 96–106)
CO2: 26 mmol/L (ref 20–29)
Creatinine, Ser: 0.72 mg/dL (ref 0.57–1.00)
GFR calc non Af Amer: 86 mL/min/{1.73_m2} (ref >59)
GFR, EST AFRICAN AMERICAN: 99 mL/min/{1.73_m2} (ref >59)
GLUCOSE: 89 mg/dL (ref 65–99)
POTASSIUM: 4.1 mmol/L (ref 3.5–5.2)
Sodium: 138 mmol/L (ref 134–144)

## 2017-03-23 ENCOUNTER — Ambulatory Visit: Payer: Medicare Other

## 2017-03-29 NOTE — Progress Notes (Deleted)
Patient ID: Katie Kaiser                 DOB: 06/17/1945                      MRN: [id number]     HPI: Katie Kaiser is a 71 y.o. female referred by Dr. Irish Lack to HTN clinic. PMH includes aortic stenosis, depression, heart murmur, and hypertension. During recent office visit with Dr Irish Lack on Nov/09/2016,  lisinopril 10mg  daily was initiated due to elevated BP. Repeat BMET completed 10 day after initiating ACEi therapy showed stable renal function and electrolytes. Patient presents today for HTN follow up ...  Current HTN meds:  Lisinopril 10mg  daily HCTZ 25mg  daily  BP goal: 130/80  Family History:  Arthritis in her sister and sister; Diabetes in her brother; HIV in her brother; Heart disease in her brother; Parkinsonism in her mother.   Social History: She quit smoking about 38 years ago. she has never used smokeless tobacco. She reports that she drinks alcohol. She reports that she does not use drugs.   Diet:   Exercise:   Home BP readings:   Wt Readings from Last 3 Encounters:  03/09/17 243 lb 12.8 oz (110.6 kg)  03/02/17 245 lb (111.1 kg)  02/12/17 245 lb (111.1 kg)   BP Readings from Last 3 Encounters:  03/09/17 (!) 150/96  03/02/17 125/77  02/12/17 138/84   Pulse Readings from Last 3 Encounters:  03/09/17 75  03/02/17 82  02/12/17 80    Past Medical History:  Diagnosis Date  . Arthritis   . Depression   . Diverticulosis   . Essential hypertension   . GERD (gastroesophageal reflux disease)   . Heart murmur    slight murmur per pt  . Hypothyroid   . Impaired fasting glucose 5/07  . Kidney stone   . OAB (overactive bladder)   . Obesity   . Pneumonia   . Vitamin D deficiency     Current Outpatient Medications on File Prior to Visit  Medication Sig Dispense Refill  . aspirin EC 81 MG tablet Take 81 mg by mouth daily.    Marland Kitchen buPROPion (WELLBUTRIN XL) 300 MG 24 hr tablet Take 1 tablet (300 mg total) by mouth daily. 90 tablet 3  . Cholecalciferol  (VITAMIN D) 1000 UNITS capsule Take 1,000 Units by mouth daily.      Marland Kitchen esomeprazole (NEXIUM) 20 MG capsule Take 20 mg by mouth daily at 12 noon.    Marland Kitchen FLUoxetine (PROZAC) 40 MG capsule TAKE ONE CAPSULE BY MOUTH ONCE DAILY 90 capsule 0  . hydrochlorothiazide (HYDRODIURIL) 25 MG tablet TAKE 1 TABLET BY MOUTH ONCE DAILY 90 tablet 1  . lisinopril (PRINIVIL,ZESTRIL) 10 MG tablet Take 1 tablet (10 mg total) daily by mouth. 90 tablet 3  . Omega-3 Fatty Acids (FISH OIL) 1000 MG CAPS Take 2 mg daily by mouth.    . SYNTHROID 200 MCG tablet TAKE 1 TABLET DAILY BEFORE BREAKFAST 90 tablet 1   No current facility-administered medications on file prior to visit.     Allergies  Allergen Reactions  . Dilaudid [Hydromorphone Hcl]     Lots of itching     Last menstrual period 05/05/1990.  No problem-specific Assessment & Plan notes found for this encounter.  Keesha Pellum Rodriguez-Guzman PharmD, BCPS, [place] [id number] 03/29/2017 7:18 PM

## 2017-03-30 ENCOUNTER — Ambulatory Visit: Payer: Medicare Other

## 2017-04-02 ENCOUNTER — Other Ambulatory Visit (INDEPENDENT_AMBULATORY_CARE_PROVIDER_SITE_OTHER): Payer: Self-pay | Admitting: Orthopedic Surgery

## 2017-04-05 ENCOUNTER — Other Ambulatory Visit: Payer: Self-pay | Admitting: Family Medicine

## 2017-04-05 DIAGNOSIS — F325 Major depressive disorder, single episode, in full remission: Secondary | ICD-10-CM

## 2017-04-07 ENCOUNTER — Encounter: Payer: Self-pay | Admitting: Pharmacist Clinician (PhC)/ Clinical Pharmacy Specialist

## 2017-04-07 ENCOUNTER — Ambulatory Visit (INDEPENDENT_AMBULATORY_CARE_PROVIDER_SITE_OTHER): Payer: Medicare Other | Admitting: Pharmacist Clinician (PhC)/ Clinical Pharmacy Specialist

## 2017-04-07 DIAGNOSIS — I10 Essential (primary) hypertension: Secondary | ICD-10-CM

## 2017-04-07 NOTE — Assessment & Plan Note (Signed)
Blood pressure appears to be well controlled today in the office.   Will have her continue with the lisinopril and hctz and check blood pressure most days of the week.  She will return after 6 weeks with her BP log and home cuff.  At that time we can determine if she has need for an increase in lisinopril dose.

## 2017-04-07 NOTE — Progress Notes (Signed)
04/07/2017 Katie Kaiser 1945-06-11 [id number]   HPI:  Katie Kaiser is a 71 y.o. female patient of Dr Irish Lack, with a [place] below who presents today for hypertension clinic evaluation.  In addition to hypertension, her medical history is significant for mild aortic stenosis and obesity.   Dr. Irish Lack started her on lisinopril last month, her pressure at his OV was 150/96.   Prior to that she had been started on HCTZ in 2016.    States full She reports no problems with either medication, takes both each morning.  No chest pain, shortness of breath, cough, edema or dizziness.   She has no problems with compliance.      Blood Pressure Goal:  130/80  Current Medications:  Lisinopril 10 mg qam  HCTZ 25 mg qam  Family Hx:  1 brother with DM, BP issues; died at 58   1 sister with aortic valve surgery, died from complications at 74  No cardiac issues with either parent or other 6 siblings  1 son, without health issues  Social Hx:  No tobacco, quit 40 years ago with pregnancy; no alcohol; coffee in the morning but no other caffeine  Diet:  Mostly home cooked, made veggie beef soup; no added salt (she doesn't like the taste of it).  No canned veggies only fresh or frozen; only occasional red meat; mostly chicken and [place]  Exercise:  2 months ago bought recumbant bike, rides 30 minutes per day  Home BP readings:  Since starting lisinopril, has had occasional readings as high as 277 systolic, mostly 824-235 range.    Intolerances:   No cardiac medication issues  Labs:  03/2017:  Na 138, K 4.1, Glu 89, BUN 17, SCr 0.72  Wt Readings from Last 3 Encounters:  03/09/17 243 lb 12.8 oz (110.6 kg)  03/02/17 245 lb (111.1 kg)  02/12/17 245 lb (111.1 kg)   BP Readings from Last 3 Encounters:  04/07/17 132/72  03/09/17 (!) 150/96  03/02/17 125/77   Pulse Readings from Last 3 Encounters:  04/07/17 64  03/09/17 75  03/02/17 82    Current Outpatient Medications    Medication Sig Dispense Refill  . aspirin EC 81 MG tablet Take 81 mg by mouth daily.    Marland Kitchen buPROPion (WELLBUTRIN XL) 300 MG 24 hr tablet Take 1 tablet (300 mg total) by mouth daily. 90 tablet 3  . Cholecalciferol (VITAMIN D) 1000 UNITS capsule Take 1,000 Units by mouth daily.      Marland Kitchen esomeprazole (NEXIUM) 20 MG capsule Take 20 mg by mouth daily at 12 noon.    Marland Kitchen FLUoxetine (PROZAC) 40 MG capsule TAKE 1 CAPSULE BY MOUTH ONCE DAILY 90 capsule 0  . hydrochlorothiazide (HYDRODIURIL) 25 MG tablet TAKE 1 TABLET BY MOUTH ONCE DAILY 90 tablet 1  . lisinopril (PRINIVIL,ZESTRIL) 10 MG tablet Take 1 tablet (10 mg total) daily by mouth. 90 tablet 3  . Omega-3 Fatty Acids (FISH OIL) 1000 MG CAPS Take 2 mg daily by mouth.    . SYNTHROID 200 MCG tablet TAKE 1 TABLET DAILY BEFORE BREAKFAST 90 tablet 1   No current facility-administered medications for this visit.     Allergies  Allergen Reactions  . Dilaudid [Hydromorphone Hcl]     Lots of itching     Past Medical History:  Diagnosis Date  . Arthritis   . Depression   . Diverticulosis   . Essential hypertension   . GERD (gastroesophageal reflux disease)   . Heart  murmur    slight murmur per pt  . Hypothyroid   . Impaired fasting glucose 5/07  . Kidney stone   . OAB (overactive bladder)   . Obesity   . Pneumonia   . Vitamin D deficiency      BP left arm 132/72, right arm 138/68, standing 122/68  Essential hypertension Blood pressure appears to be well controlled today in the office.   Will have her continue with the lisinopril and hctz and check blood pressure most days of the week.  She will return after 6 weeks with her BP log and home cuff.  At that time we can determine if she has need for an increase in lisinopril dose.      Tommy Medal PharmD CPP [facility] [address] [place], [place] [address] [phone]

## 2017-04-07 NOTE — Patient Instructions (Addendum)
Return for a a follow up appointment in 6 weeks  Your blood pressure today is 132/72   Check your blood pressure at home no more than twice daily and keep record of the readings.  Take your BP meds as follows:  Continue with your current medications  Bring all of your meds, your BP cuff and your record of home blood pressures to your next appointment.  Exercise as you're able, try to walk approximately 30 minutes per day.  Keep salt intake to a minimum, especially watch canned and prepared boxed foods.  Eat more fresh fruits and vegetables and fewer canned items.  Avoid eating in fast food restaurants.    HOW TO TAKE YOUR BLOOD PRESSURE: . Rest 5 minutes before taking your blood pressure. .  Don't smoke or drink caffeinated beverages for at least 30 minutes before. . Take your blood pressure before (not after) you eat. . Sit comfortably with your back supported and both feet on the floor (don't cross your legs). . Elevate your arm to heart level on a table or a desk. . Use the proper sized cuff. It should fit smoothly and snugly around your bare upper arm. There should be enough room to slip a fingertip under the cuff. The bottom edge of the cuff should be 1 inch above the crease of the elbow. . Ideally, take 3 measurements at one sitting and record the average.

## 2017-04-08 ENCOUNTER — Telehealth (INDEPENDENT_AMBULATORY_CARE_PROVIDER_SITE_OTHER): Payer: Self-pay | Admitting: Orthopedic Surgery

## 2017-04-08 NOTE — Telephone Encounter (Signed)
Patient called stating that her pharmacy faxed over a refill request two times for her Diclofenac.  Patient states that her pharmacy still does not have it.  She uses [facility] on [address].   CB#[phone].  Thank you.

## 2017-04-08 NOTE — Telephone Encounter (Signed)
LVMOM to let pt know I did not see med on her chart. Asked her to call me when in [place]

## 2017-04-09 ENCOUNTER — Other Ambulatory Visit: Payer: Self-pay

## 2017-04-30 DIAGNOSIS — J189 Pneumonia, unspecified organism: Secondary | ICD-10-CM | POA: Diagnosis not present

## 2017-05-04 DIAGNOSIS — M1990 Unspecified osteoarthritis, unspecified site: Secondary | ICD-10-CM | POA: Insufficient documentation

## 2017-05-04 DIAGNOSIS — F329 Major depressive disorder, single episode, unspecified: Secondary | ICD-10-CM | POA: Insufficient documentation

## 2017-05-04 DIAGNOSIS — F32A Depression, unspecified: Secondary | ICD-10-CM | POA: Insufficient documentation

## 2017-05-04 DIAGNOSIS — N200 Calculus of kidney: Secondary | ICD-10-CM | POA: Insufficient documentation

## 2017-05-04 DIAGNOSIS — K5790 Diverticulosis of intestine, part unspecified, without perforation or abscess without bleeding: Secondary | ICD-10-CM | POA: Insufficient documentation

## 2017-05-04 DIAGNOSIS — K219 Gastro-esophageal reflux disease without esophagitis: Secondary | ICD-10-CM | POA: Insufficient documentation

## 2017-05-04 DIAGNOSIS — R011 Cardiac murmur, unspecified: Secondary | ICD-10-CM | POA: Insufficient documentation

## 2017-05-04 DIAGNOSIS — E039 Hypothyroidism, unspecified: Secondary | ICD-10-CM | POA: Insufficient documentation

## 2017-05-04 DIAGNOSIS — E669 Obesity, unspecified: Secondary | ICD-10-CM | POA: Insufficient documentation

## 2017-05-04 DIAGNOSIS — J189 Pneumonia, unspecified organism: Secondary | ICD-10-CM | POA: Insufficient documentation

## 2017-05-04 DIAGNOSIS — N3281 Overactive bladder: Secondary | ICD-10-CM | POA: Insufficient documentation

## 2017-05-06 ENCOUNTER — Ambulatory Visit (INDEPENDENT_AMBULATORY_CARE_PROVIDER_SITE_OTHER): Payer: Medicare Other | Admitting: Family Medicine

## 2017-05-06 ENCOUNTER — Encounter: Payer: Self-pay | Admitting: Family Medicine

## 2017-05-06 VITALS — BP 118/68 | HR 84 | Temp 97.3°F | Ht 66.0 in | Wt 244.0 lb

## 2017-05-06 DIAGNOSIS — R05 Cough: Secondary | ICD-10-CM

## 2017-05-06 DIAGNOSIS — R059 Cough, unspecified: Secondary | ICD-10-CM

## 2017-05-06 DIAGNOSIS — J309 Allergic rhinitis, unspecified: Secondary | ICD-10-CM

## 2017-05-06 DIAGNOSIS — J189 Pneumonia, unspecified organism: Secondary | ICD-10-CM

## 2017-05-06 MED ORDER — BENZONATATE 200 MG PO CAPS: 200.0000 mg | ORAL_CAPSULE | Freq: Three times a day (TID) | ORAL | 0 refills | Status: DC | PRN

## 2017-05-06 MED ORDER — HYDROCODONE-HOMATROPINE 5-1.5 MG/5ML PO SYRP: 5.0000 mL | ORAL_SOLUTION | Freq: Every evening | ORAL | 0 refills | Status: DC | PRN

## 2017-05-06 NOTE — Progress Notes (Signed)
Chief Complaint  Patient presents with  . Follow-up    from UC visit-pneumonia. Has a pain on her left side of her chest when she breaths in, started last night during the night.     Seen at [facility] in [place], [place] ([place] 12/27 with a cough x 1 month. She was diagnosed with pneumonia and put on doxycycline. She brings in records, but no x-ray results (just patient information and diagnosis/medicatons).  She reports she is doing better than she was, but is still coughing a lot.  She wasn't given any cough medications. She denies shortness of breath. She had been to [facility] 12/4, and told she had a virus. The cough never resolved.  Last night she woke up and felt a pain at the left side of her anterior chest. It still hurts she takes a deep breath and is sore to touch.  Her cough is mostly nonproductive, only occasionally gets clear mucus.  No x-ray results available.  Denies side effects to the antibiotics.  She hasn't tried any cough syrup. She had some perles leftover that helped some. She only has a few left. Sleep is interrupted by her coughing.  She did have some nasal congestion, and claritin has helped. She is asking about seeing an allergist. She has constant allergies.  She feels like they are getting worse. She has decreased sense of smell. Claritin helps other symptoms. She has never tried nasal steroid sprays. She denies sinus pain, discolored mucus.  PMH, PSH, SH reviewed  Outpatient Encounter Medications as of 05/06/2017  Medication Sig  . aspirin EC 81 MG tablet Take 81 mg by mouth daily.  Marland Kitchen buPROPion (WELLBUTRIN XL) 300 MG 24 hr tablet Take 1 tablet (300 mg total) by mouth daily.  . Cholecalciferol (VITAMIN D) 1000 UNITS capsule Take 1,000 Units by mouth daily.    Marland Kitchen doxycycline (VIBRAMYCIN) 100 MG capsule Take 100 mg by mouth 2 (two) times daily.  Marland Kitchen esomeprazole (NEXIUM) 20 MG capsule Take 20 mg by mouth daily at 12 noon.  Marland Kitchen FLUoxetine (PROZAC) 40 MG capsule TAKE 1  CAPSULE BY MOUTH ONCE DAILY  . hydrochlorothiazide (HYDRODIURIL) 25 MG tablet TAKE 1 TABLET BY MOUTH ONCE DAILY  . lisinopril (PRINIVIL,ZESTRIL) 10 MG tablet Take 1 tablet (10 mg total) daily by mouth.  . Omega-3 Fatty Acids (FISH OIL) 1000 MG CAPS Take 2 mg daily by mouth.  . SYNTHROID 200 MCG tablet TAKE 1 TABLET DAILY BEFORE BREAKFAST  . benzonatate (TESSALON) 200 MG capsule Take 1 capsule (200 mg total) by mouth 3 (three) times daily as needed for cough.  Marland Kitchen HYDROcodone-homatropine (HYCODAN) 5-1.5 MG/5ML syrup Take 5 mLs by mouth at bedtime as needed for cough.   No facility-administered encounter medications on file as of 05/06/2017.    (hycodan rx'd today)  Allergies  Allergen Reactions  . Dilaudid [Hydromorphone Hcl]     Lots of itching     ROS:  No fever, chills, headache, dizziness, shortness of breath, edema, chest pain (just the left sided pain that started last night). No nausea, vomiting, diarrhea, bleeding, bruising, rash.  +allergies, decreased smell/taste, cough per HPI.  Moods are good.  PHYSICAL EXAM:  BP 118/68   Pulse 84   Temp (!) 97.3 F (36.3 C) (Tympanic)   Ht 5\' 6"  (1.676 m)   Wt 244 lb (110.7 kg)   LMP 05/05/1990   SpO2 99%   BMI 39.38 kg/m   Pleasant female, with frequent dry hacky cough.  Speaking easily  in full sentences, between coughing spells. She doesn't appear to be in any distress. HEENT: PERRL, EOMI, conjunctiva and sclera are clear. Nasal mucosa is mod edematous, pale, clear mucus. Sinuses are nontender.  OP is clear Neck: no lymphadenopathy or mass Heart: regular rate and rhythm with 3/6 SEM at RUSB, unchanged Chest wall: Tender at 1 level at left costochondral junction at her medial left breast.  Small area of yellow ecchymosis is lateral to this (and also tender) Remainder of chest wall is nontender Lungs clear bilaterally.  Some coughing with deeper breaths. No wheezes, rales, ronchi, good air movement throughout Extremities: no edema,  cyanosis Skin: normal turgor  ASSESSMENT/PLAN:  Pneumonia due to infectious organism, unspecified laterality, unspecified part of lung - diagnosed in UC, treated with doxy. Complete course.  on xray results available. - Plan: DG Chest 2 View  Cough - Plan: benzonatate (TESSALON) 200 MG capsule, HYDROcodone-homatropine (HYCODAN) 5-1.5 MG/5ML syrup, DG Chest 2 View  Allergic rhinitis, unspecified seasonality, unspecified trigger - add flonase.  Consider allergist vs ENT if ongoing problems, especially re: smell/taste   Counseled re: risks/side effects of meds; counseled re: s/sx for which she should return for eval.  To get f/u CXR Monday if not significantly better. If back to baseline, okay to wait x 3 weeks.  30 min visit, more than 1/2 spent counseling   Take the hydrocodone syrup at bedtime only. This may cause drowsiness, but will help your cough so that you can sleep. Do not use this during the day. Instead, use the benzonatate (tessalon perles) during the day, as needed for cough.  Continue the doxycycline.  If your cough is not continuing to improve or completely resolve, you need to return for a recheck. If you are completely better within the week, then it is okay to wait to get the follow-up x-ray in 3-4 weeks.  If your cough isn't improving, then get the x-ray sooner, and follow-up here after you have the x-ray. I recommend going on Monday if your aren't better (and be seen here later that day). You will need a follow-up chest x-ray within 3-4 weeks to ensure complete clearing of pneumonia.  If you are completely better, then you can wait to have the x-ray done until the end of the month.  Apply moist heat to the area of pain in your left chest wall. You can also try topical medications such as biofreeze or icy hot. You can try taking an anti-inflammatory medication such as aleve or ibuprofen, to also help with pain and inflammation at the chest wall.  You may continue your  claritin. Try taking flonase daily ( 2 gentle sniffs into each nostril); it takes 1-2 weeks for the full effect, and should be continued on a daily basis (not sporadic). If your allergies are better, you may not need the claritin daily along with the flonase; it is okay to use both medications together long-term, if needed. If you have ongoing problems with allergies, an allergist evaluation may be indicated. If ongoing problems with loss of sense of smell, despite improved allergy symptoms, allergist or ENT might be indicated.

## 2017-05-06 NOTE — Patient Instructions (Signed)
  Take the hydrocodone syrup at bedtime only. This may cause drowsiness, but will help your cough so that you can sleep. Do not use this during the day. Instead, use the benzonatate (tessalon perles) during the day, as needed for cough.  Continue the doxycycline.  If your cough is not continuing to improve or completely resolve, you need to return for a recheck. If you are completely better within the week, then it is okay to wait to get the follow-up x-ray in 3-4 weeks.  If your cough isn't improving, then get the x-ray sooner, and follow-up here after you have the x-ray. I recommend going on Monday if your aren't better (and be seen here later that day). You will need a follow-up chest x-ray within 3-4 weeks to ensure complete clearing of pneumonia.  If you are completely better, then you can wait to have the x-ray done until the end of the month.  Apply moist heat to the area of pain in your left chest wall. You can also try topical medications such as biofreeze or icy hot. You can try taking an anti-inflammatory medication such as aleve or ibuprofen, to also help with pain and inflammation at the chest wall.  You may continue your claritin. Try taking flonase daily ( 2 gentle sniffs into each nostril); it takes 1-2 weeks for the full effect, and should be continued on a daily basis (not sporadic). If your allergies are better, you may not need the claritin daily along with the flonase; it is okay to use both medications together long-term, if needed. If you have ongoing problems with allergies, an allergist evaluation may be indicated. If ongoing problems with loss of sense of smell, despite improved allergy symptoms, allergist or ENT might be indicated.

## 2017-05-21 ENCOUNTER — Ambulatory Visit: Payer: Medicare Other

## 2017-05-28 ENCOUNTER — Ambulatory Visit (INDEPENDENT_AMBULATORY_CARE_PROVIDER_SITE_OTHER): Payer: Medicare Other | Admitting: Pharmacist

## 2017-05-28 VITALS — BP 128/66 | HR 74

## 2017-05-28 DIAGNOSIS — I10 Essential (primary) hypertension: Secondary | ICD-10-CM | POA: Diagnosis not present

## 2017-05-28 NOTE — Progress Notes (Signed)
Patient ID: DREYAH MONTROSE                 DOB: 08/21/1945                      MRN: [id number]      HPI:  Katie Kaiser is a 72 y.o. female patient of Dr Irish Lack, with a [place] below who presents today for hypertension clinic follow up.  In addition to hypertension, her medical history is significant for mild aortic stenosis and obesity. BP at goal during last office visit on 04/07/2017. She continue to take lisinopril and HCTZ daily. Reports no problems with either medication, takes both each morning. Denies chest pain, shortness of breath, cough, edema or dizziness. She compliance issues noted.      Blood Pressure Goal:  130/80  Current Medications:             Lisinopril 10 mg every morning             HCTZ 25 mg every morining  Family History:             1 brother with DM, BP issues; died at 64              1 sister with aortic valve surgery, died from complications at 53             No cardiac issues with either parent or other 6 siblings             1 son, without health issues  Social History:             No tobacco, quit 40 years ago with pregnancy; no alcohol; coffee in the morning but no other caffeine  Diet:             Mostly home cooked, made veggie beef soup; no added salt (she doesn't like the taste of it).  No canned veggies only fresh or frozen; only occasional red meat; mostly chicken and [place]  Exercise:             2 months ago bought recumbant bike, rides 30 minutes per day  Home BP readings: no records provided today for assessment           [facility] BP monitor = accurate within < 63mmHg  Wt Readings from Last 3 Encounters:  05/06/17 244 lb (110.7 kg)  03/09/17 243 lb 12.8 oz (110.6 kg)  03/02/17 245 lb (111.1 kg)   BP Readings from Last 3 Encounters:  05/28/17 128/66  05/06/17 118/68  04/07/17 132/72   Pulse Readings from Last 3 Encounters:  05/28/17 74  05/06/17 84  04/07/17 64    Past Medical History:  Diagnosis Date  .  Arthritis   . Depression   . Diverticulosis   . Essential hypertension   . GERD (gastroesophageal reflux disease)   . Heart murmur    slight murmur per pt  . Hypothyroid   . Impaired fasting glucose 5/07  . Kidney stone   . OAB (overactive bladder)   . Obesity   . Pneumonia   . Vitamin D deficiency     Current Outpatient Medications on File Prior to Visit  Medication Sig Dispense Refill  . aspirin EC 81 MG tablet Take 81 mg by mouth daily.    Marland Kitchen buPROPion (WELLBUTRIN XL) 300 MG 24 hr tablet Take 1 tablet (300 mg total) by mouth daily. 90 tablet 3  . Cholecalciferol (VITAMIN  D) 1000 UNITS capsule Take 1,000 Units by mouth daily.      Marland Kitchen doxycycline (VIBRAMYCIN) 100 MG capsule Take 100 mg by mouth 2 (two) times daily.    Marland Kitchen esomeprazole (NEXIUM) 20 MG capsule Take 20 mg by mouth daily at 12 noon.    Marland Kitchen FLUoxetine (PROZAC) 40 MG capsule TAKE 1 CAPSULE BY MOUTH ONCE DAILY 90 capsule 0  . hydrochlorothiazide (HYDRODIURIL) 25 MG tablet TAKE 1 TABLET BY MOUTH ONCE DAILY 90 tablet 1  . HYDROcodone-homatropine (HYCODAN) 5-1.5 MG/5ML syrup Take 5 mLs by mouth at bedtime as needed for cough. 75 mL 0  . lisinopril (PRINIVIL,ZESTRIL) 10 MG tablet Take 1 tablet (10 mg total) daily by mouth. 90 tablet 3  . Omega-3 Fatty Acids (FISH OIL) 1000 MG CAPS Take 2 mg daily by mouth.    . SYNTHROID 200 MCG tablet TAKE 1 TABLET DAILY BEFORE BREAKFAST 90 tablet 1  . benzonatate (TESSALON) 200 MG capsule Take 1 capsule (200 mg total) by mouth 3 (three) times daily as needed for cough. (Patient not taking: Reported on 05/28/2017) 30 capsule 0   No current facility-administered medications on file prior to visit.     Allergies  Allergen Reactions  . Dilaudid [Hydromorphone Hcl]     Lots of itching     Blood pressure 128/66, pulse 74, last menstrual period 05/05/1990.  Essential hypertension Blood pressure at desire goal of <130/80. Patient denies problems with current therapy. Technique for proper BP  monitor usage was discusses and practiced in office. Will continue all medication without changes and follow up as needed.   Gracelee Stemmler Rodriguez-Guzman PharmD, BCPS, [place] [id number] 05/28/2017 4:16 PM

## 2017-05-28 NOTE — Patient Instructions (Signed)
Return for a follow up appointment as needed  Check your blood pressure at home daily (if able) and keep record of the readings.  Take your BP meds as follows: *Continue all medication as previously prescribed*  Bring all of your meds, your BP cuff and your record of home blood pressures to your next appointment.  Exercise as you're able, try to walk approximately 30 minutes per day.  Keep salt intake to a minimum, especially watch canned and prepared boxed foods.  Eat more fresh fruits and vegetables and fewer canned items.  Avoid eating in fast food restaurants.    HOW TO TAKE YOUR BLOOD PRESSURE: . Rest 5 minutes before taking your blood pressure. .  Don't smoke or drink caffeinated beverages for at least 30 minutes before. . Take your blood pressure before (not after) you eat. . Sit comfortably with your back supported and both feet on the floor (don't cross your legs). . Elevate your arm to heart level on a table or a desk. . Use the proper sized cuff. It should fit smoothly and snugly around your bare upper arm. There should be enough room to slip a fingertip under the cuff. The bottom edge of the cuff should be 1 inch above the crease of the elbow. . Ideally, take 3 measurements at one sitting and record the average.

## 2017-05-28 NOTE — Assessment & Plan Note (Signed)
Blood pressure at desire goal of <130/80. Patient denies problems with current therapy. Technique for proper BP monitor usage was discusses and practiced in office. Will continue all medication without changes and follow up as needed.

## 2017-05-30 IMAGING — US US RENAL
1 series · 14 of 25 positions shown · non-contrast
Comparison: At [DATE] abdominal CT

CLINICAL DATA: Right flank pain and history of stones

EXAM:
RENAL / URINARY TRACT ULTRASOUND COMPLETE

[Series 1: us renal · 0.21mm/px · 14 of 47 slices shown]
[im 1/47]
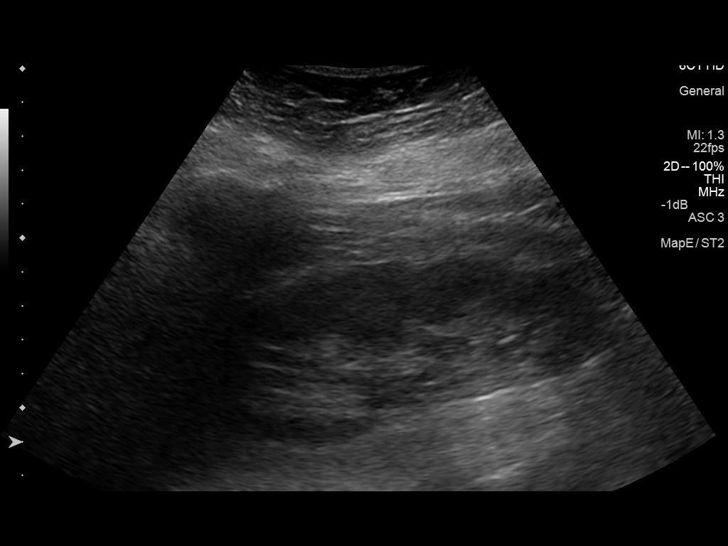
[im 4/47]
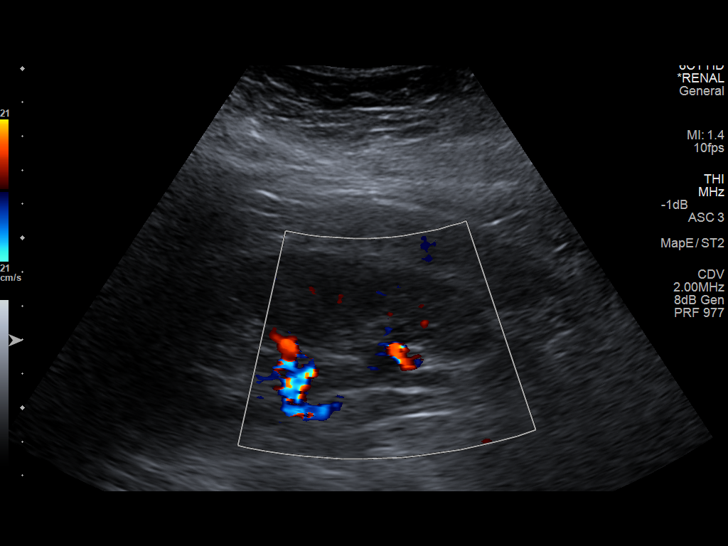
[im 8/47]
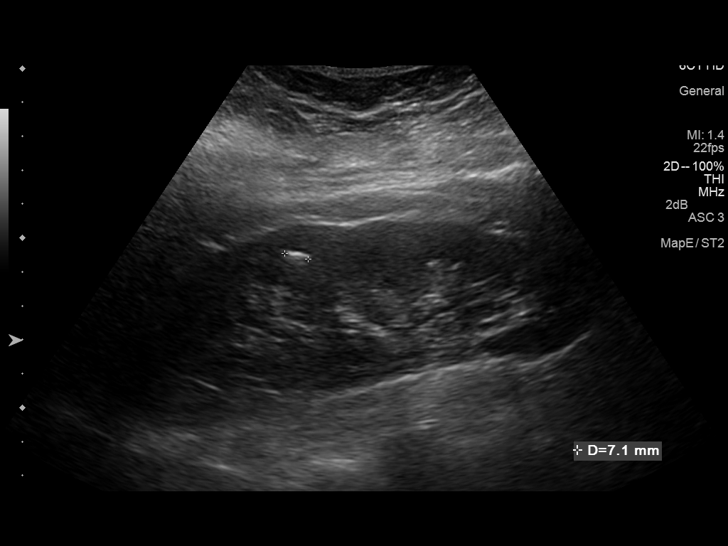
[im 12/47]
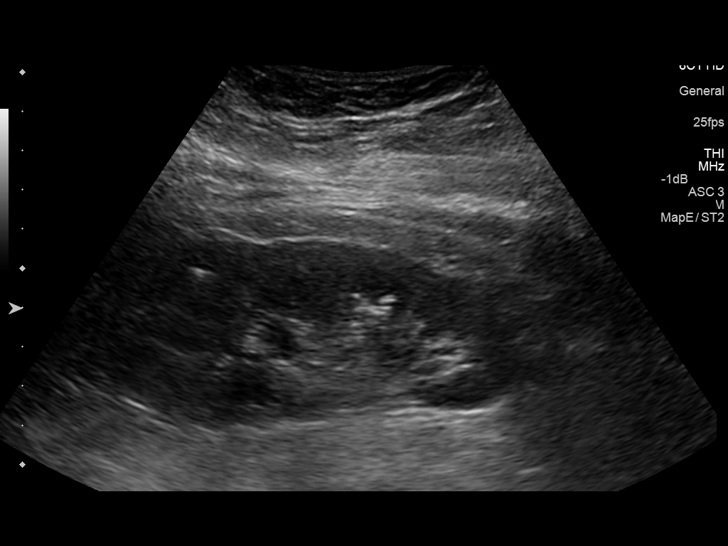
[im 16/47]
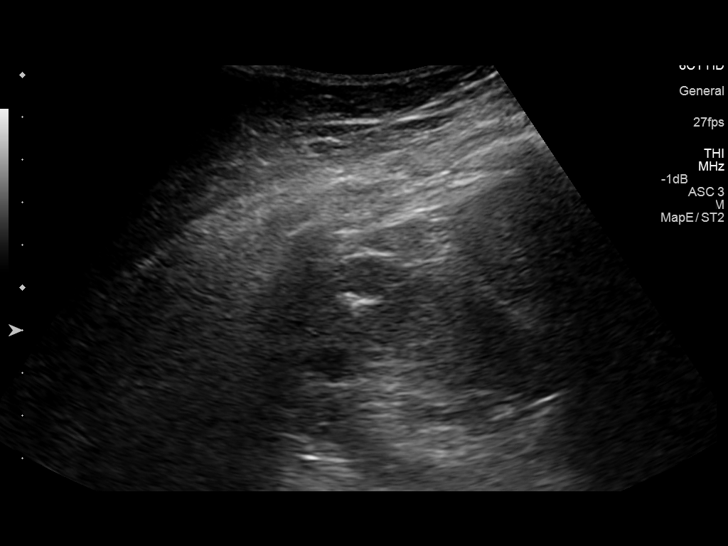
[im 18/47]
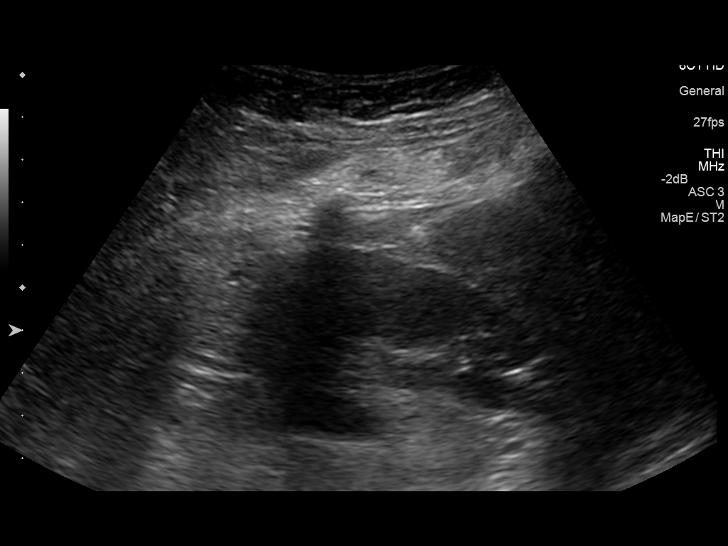
[im 22/47]
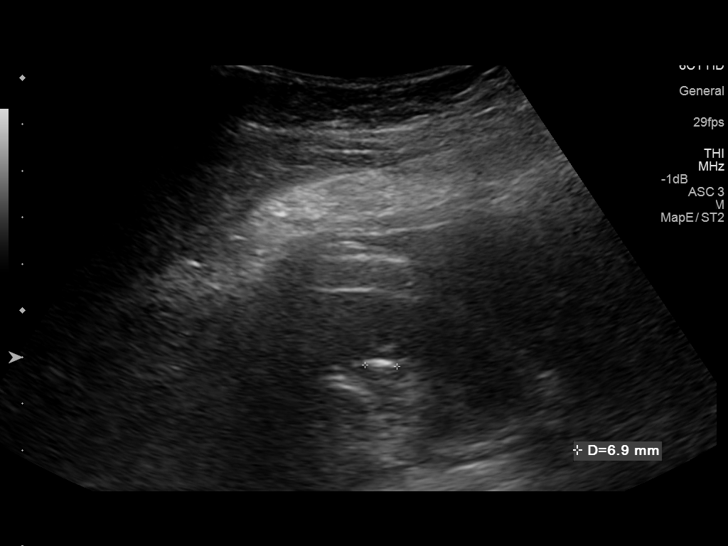
[im 25/47]
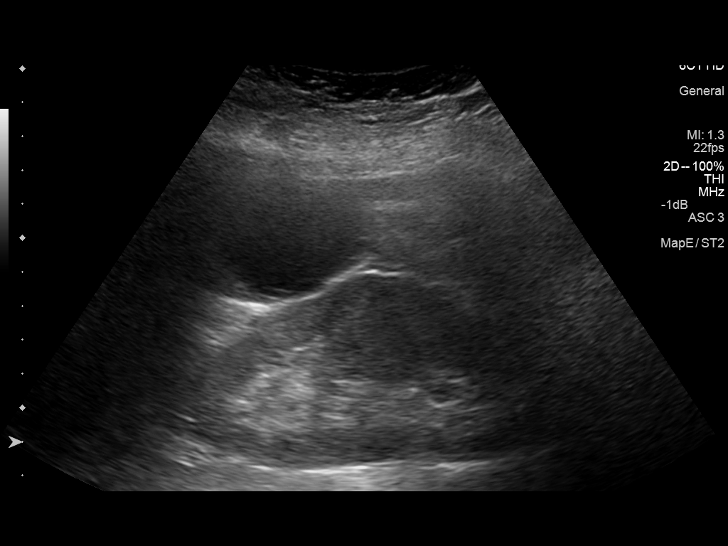
[im 29/47]
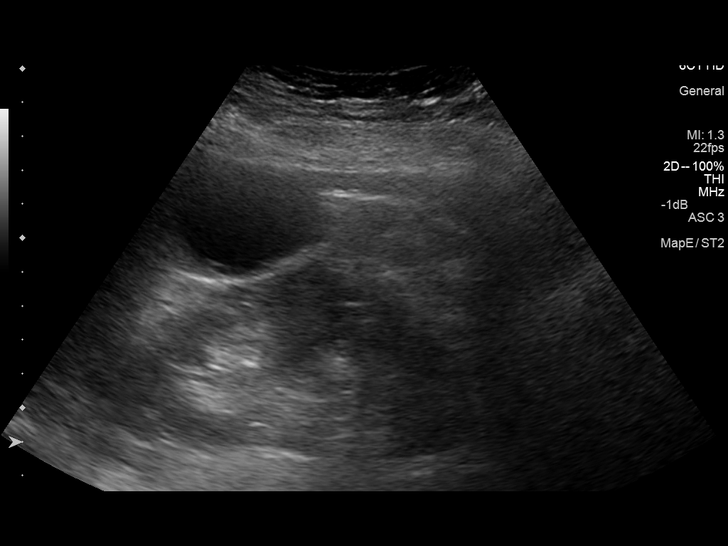
[im 31/47]
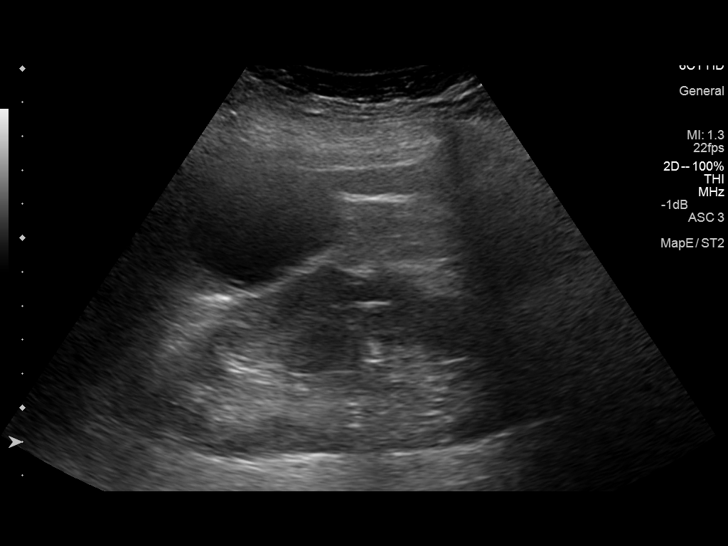
[im 35/47]
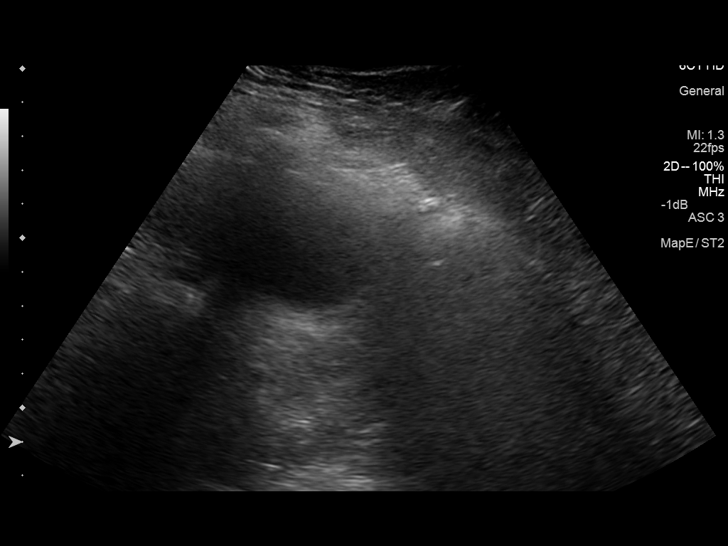
[im 39/47]
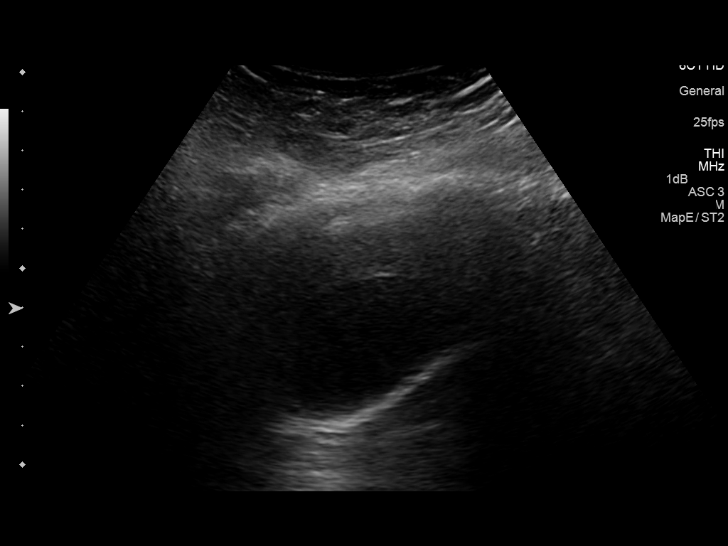
[im 43/47]
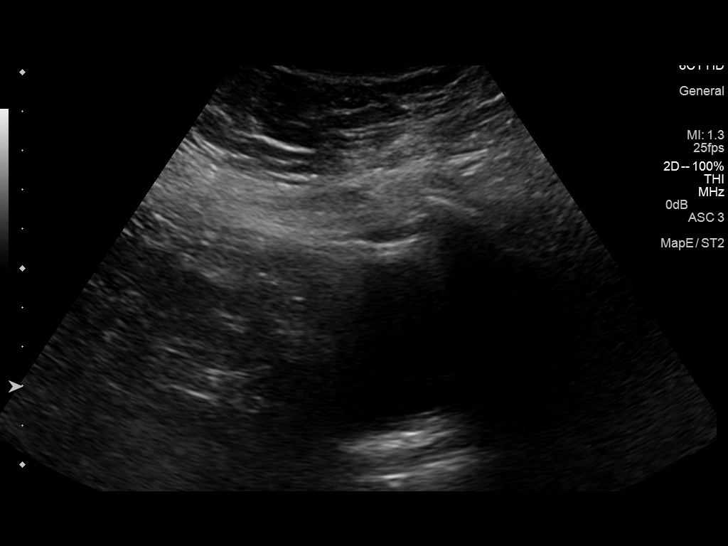
[im 47/47]
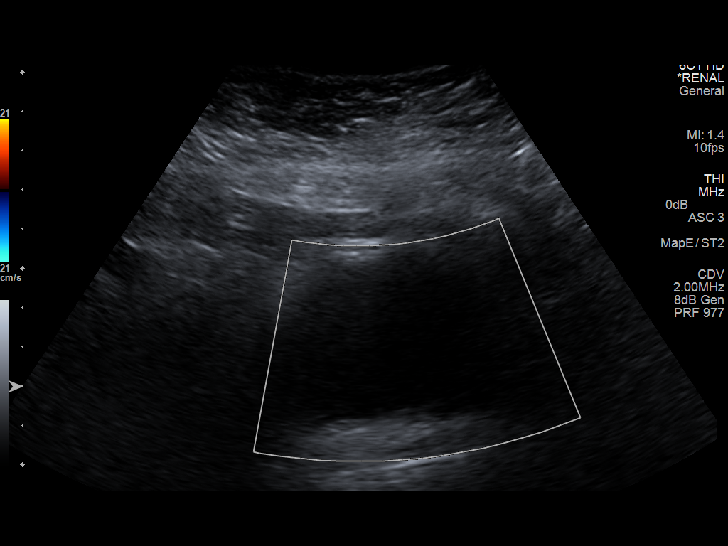

[14 of 25 positions shown; findings below may reference images not displayed]

FINDINGS: Right Kidney:

Length: 13.5 cm. No hydronephrosis. Measured echogenic foci could
reflect calculi, as suggested by the history, but are not definitive
as there is no clear shadowing or twinkling artifact. No ring down
artifact to suggest collecting system gas. No visible mass lesion.
Hypo/non enhancing areas noted on CT 02/22/2012 are not seen.

Left Kidney:

Length: 12.5 cm. No hydronephrosis. Normal echogenicity. No evidence
of mass lesion.

Bladder:

Appears normal for degree of bladder distention.

28 mm anechoic and nonvascular structure in the lower spleen,
presumably a pseudocyst.
IMPRESSION: 1. No hydronephrosis.
2. 28 mm splenic cyst/pseudocyst.

## 2017-07-06 ENCOUNTER — Telehealth: Payer: Self-pay | Admitting: *Deleted

## 2017-07-06 NOTE — Telephone Encounter (Signed)
Advise patient that I don't have much experience with it.  I do know that it can be helpful to be used topically--I would probably try it that way first

## 2017-07-06 NOTE — Telephone Encounter (Signed)
Patient called and wanted to know if it is okay for her to take cannabodiol oil (cbd oil). Her daughter in law is taking for her joints and it is helping a lot. Please advise.

## 2017-07-06 NOTE — Telephone Encounter (Signed)
Patient advised.

## 2017-07-31 ENCOUNTER — Other Ambulatory Visit: Payer: Self-pay | Admitting: Family Medicine

## 2017-07-31 DIAGNOSIS — F325 Major depressive disorder, single episode, in full remission: Secondary | ICD-10-CM

## 2017-08-04 IMAGING — CR DG CHEST 2V
2 series · 2 of 2 positions shown · non-contrast
Comparison: 05/21/2015 and 09/05/2011 chest radiographs

CLINICAL DATA: 69-year-old female for preoperative respiratory
examination prior to right knee replacement.

EXAM:
CHEST  2 VIEW

[w chest pa]
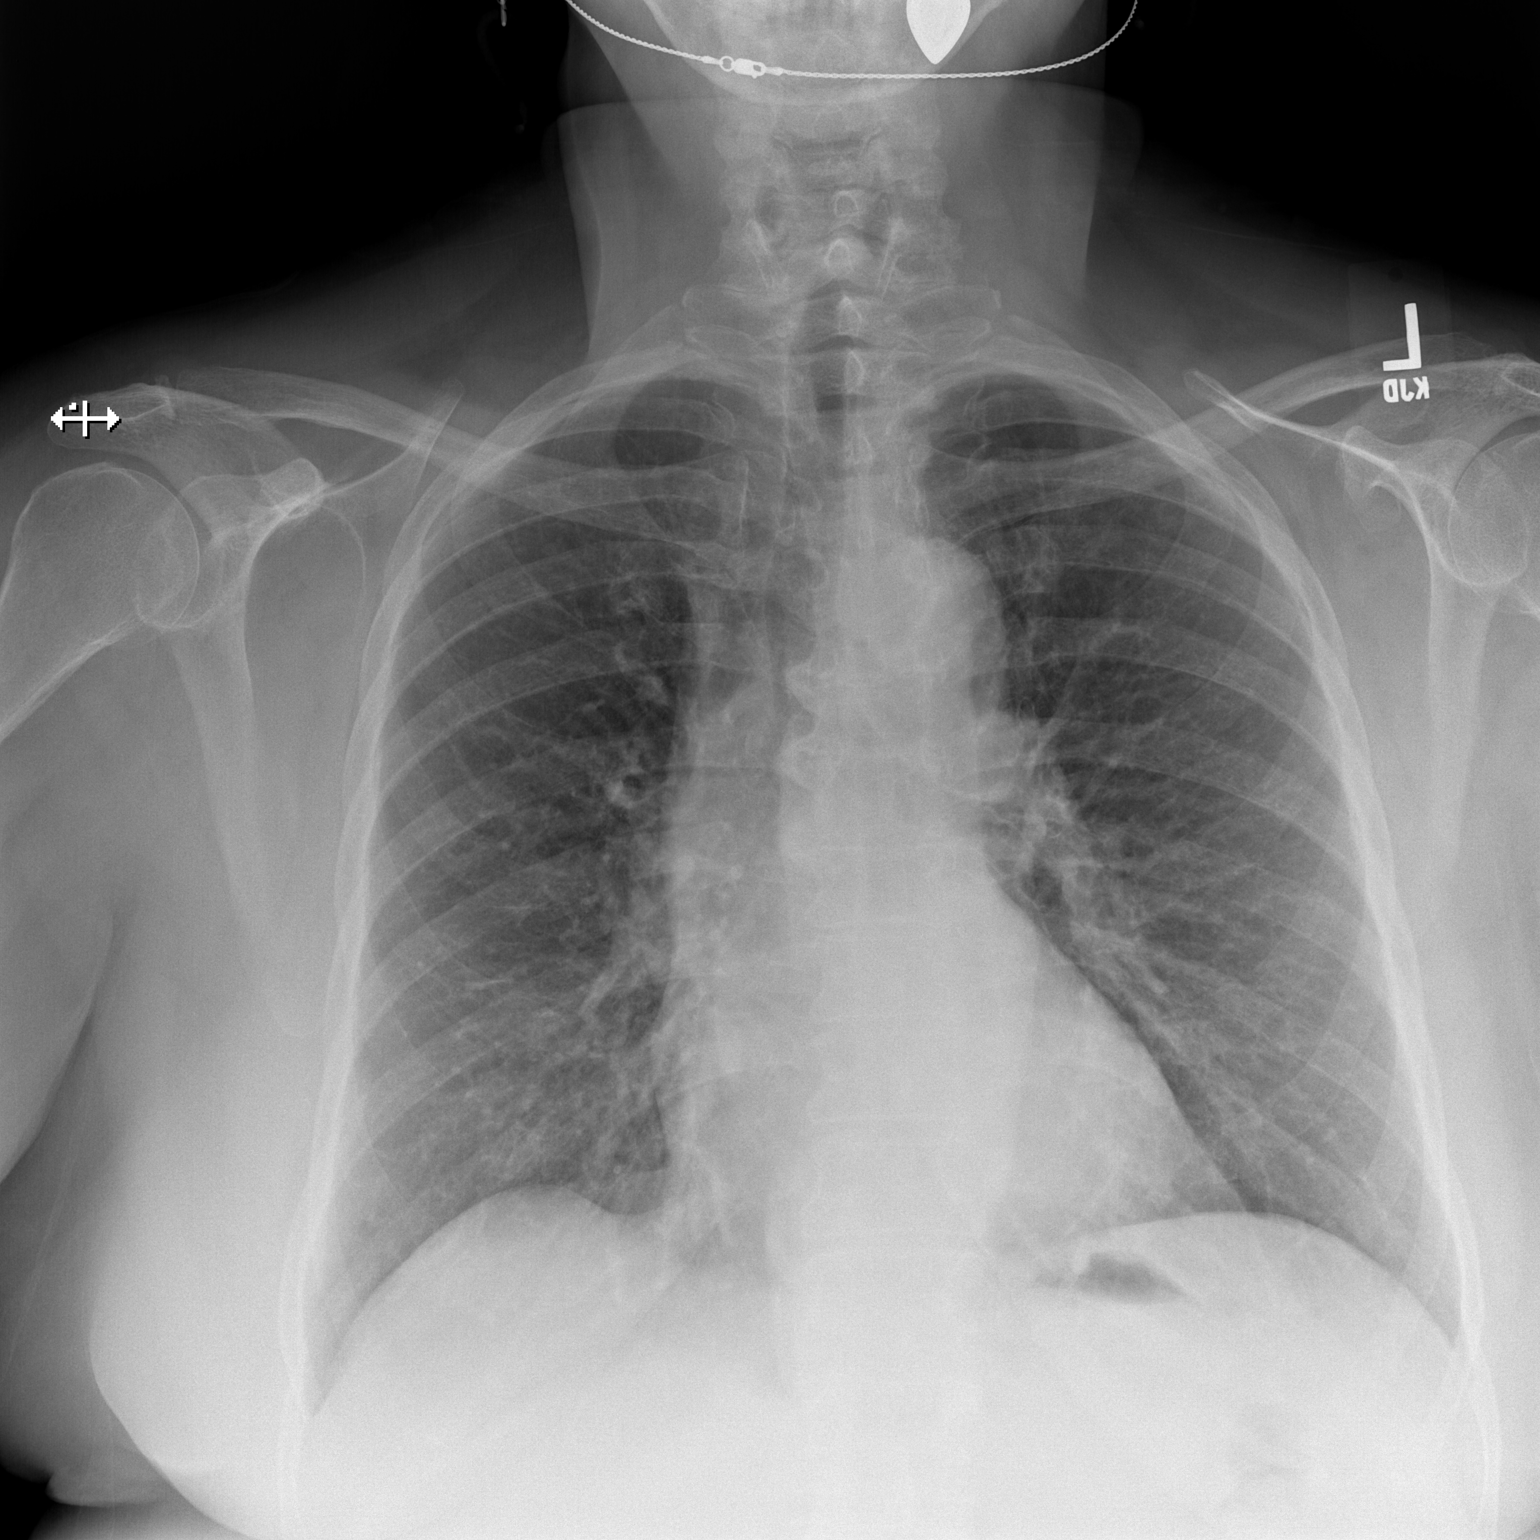

[w chest lat]
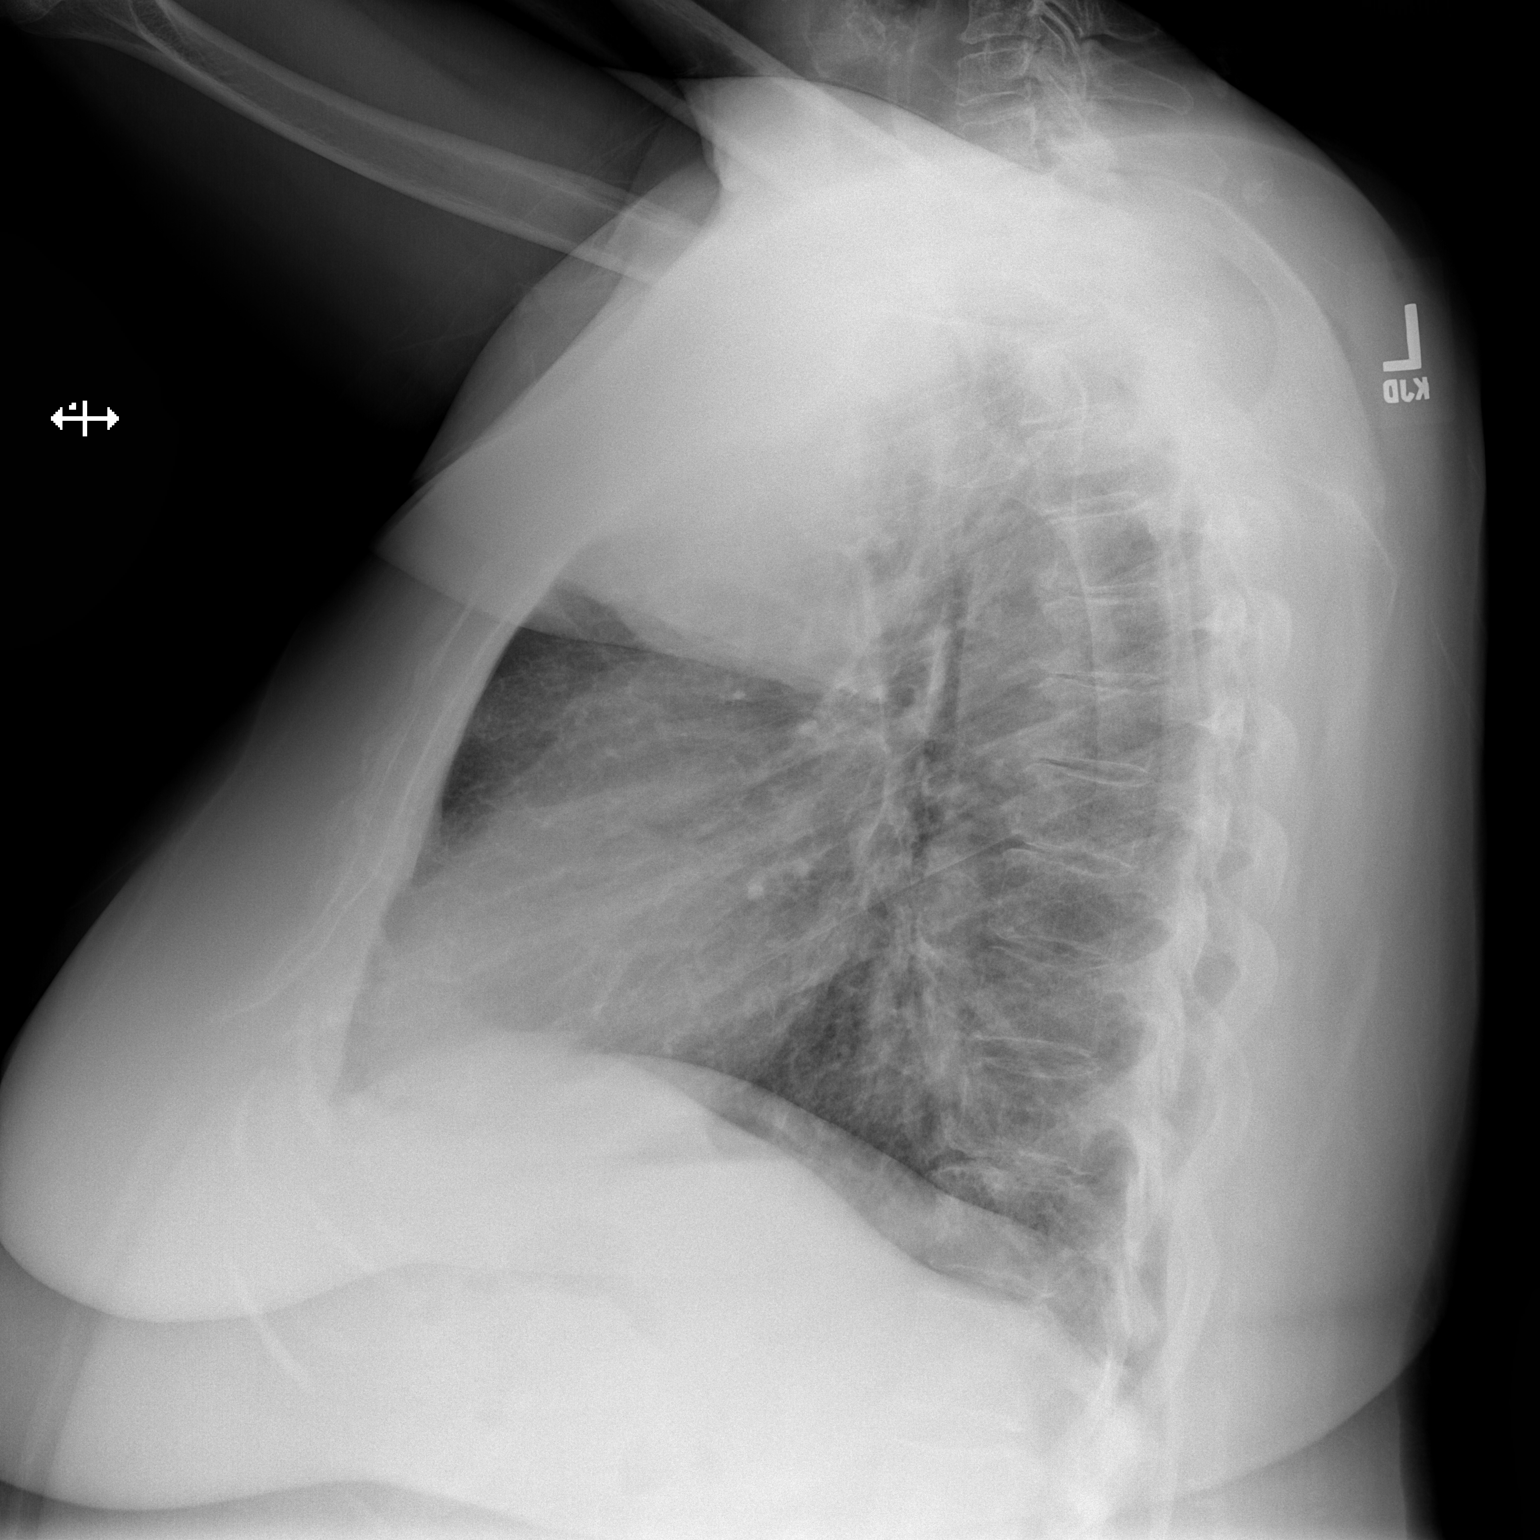

[2 of 2 positions shown; findings below may reference images not displayed]

FINDINGS: Mild cardiomegaly and mild interstitial prominence again noted.

There is no evidence of focal airspace disease, pulmonary edema,
suspicious pulmonary nodule/mass, pleural effusion, or pneumothorax.
No acute bony abnormalities are identified.
IMPRESSION: Mild cardiomegaly without evidence of acute cardiopulmonary disease.

Mild chronic interstitial prominence.

## 2017-08-12 ENCOUNTER — Encounter: Payer: Medicare Other | Admitting: Family Medicine

## 2017-08-18 ENCOUNTER — Ambulatory Visit (INDEPENDENT_AMBULATORY_CARE_PROVIDER_SITE_OTHER): Payer: Medicare Other | Admitting: Family Medicine

## 2017-08-18 ENCOUNTER — Encounter: Payer: Self-pay | Admitting: Family Medicine

## 2017-08-18 VITALS — BP 126/78 | HR 78 | Temp 98.0°F | Ht 63.0 in | Wt 238.0 lb

## 2017-08-18 DIAGNOSIS — J029 Acute pharyngitis, unspecified: Secondary | ICD-10-CM | POA: Diagnosis not present

## 2017-08-18 MED ORDER — AMOXICILLIN 875 MG PO TABS: 875.0000 mg | ORAL_TABLET | Freq: Two times a day (BID) | ORAL | 0 refills | Status: DC

## 2017-08-18 NOTE — Progress Notes (Signed)
   Subjective:    Patient ID: Katie Kaiser, female    DOB: 17-Mar-1946, 72 y.o.   MRN: [id number]  HPI  She complains of a 10-day history of sore throat, PND, rhinorrhea as well as a cough however she says the cough actually started 2 months prior to that.  No sneezing, itchy watery eyes.  She has been taking Claritin.  She does state that she does intermittently have allergy symptoms but not usually on a yearly basis.    Review of Systems     Objective:   Physical Exam Alert and in no distress. Tympanic membranes and canals are normal. Pharyngeal area is normal. Neck is supple without adenopathy or thyromegaly. Cardiac exam shows a regular sinus rhythm without murmurs or gallops. Lungs are clear to auscultation.        Assessment & Plan:  Pharyngitis, unspecified etiology - Plan: amoxicillin (AMOXIL) 875 MG tablet  I explained that I did not think this was strep related but she has had this for 10 days and an antibiotic is not unusual.  She would like to do that.  She will let me know how she is doing when she finishes the antibiotic if there is any question.

## 2017-09-04 ENCOUNTER — Other Ambulatory Visit: Payer: Self-pay | Admitting: Family Medicine

## 2017-09-04 DIAGNOSIS — I10 Essential (primary) hypertension: Secondary | ICD-10-CM

## 2017-09-07 ENCOUNTER — Encounter: Payer: Medicare Other | Admitting: Family Medicine

## 2017-09-27 ENCOUNTER — Other Ambulatory Visit: Payer: Self-pay | Admitting: Family Medicine

## 2017-09-27 DIAGNOSIS — E039 Hypothyroidism, unspecified: Secondary | ICD-10-CM

## 2017-12-10 ENCOUNTER — Other Ambulatory Visit: Payer: Self-pay | Admitting: Family Medicine

## 2017-12-10 DIAGNOSIS — I10 Essential (primary) hypertension: Secondary | ICD-10-CM

## 2018-01-12 ENCOUNTER — Other Ambulatory Visit: Payer: Self-pay | Admitting: Interventional Cardiology

## 2018-02-25 ENCOUNTER — Other Ambulatory Visit: Payer: Self-pay

## 2018-02-25 ENCOUNTER — Ambulatory Visit (INDEPENDENT_AMBULATORY_CARE_PROVIDER_SITE_OTHER): Payer: Medicare Other | Admitting: Physician Assistant

## 2018-02-25 ENCOUNTER — Encounter: Payer: Self-pay | Admitting: Physician Assistant

## 2018-02-25 ENCOUNTER — Telehealth: Payer: Self-pay | Admitting: Interventional Cardiology

## 2018-02-25 VITALS — BP 142/80 | HR 84 | Ht 63.0 in | Wt 240.0 lb

## 2018-02-25 DIAGNOSIS — I350 Nonrheumatic aortic (valve) stenosis: Secondary | ICD-10-CM

## 2018-02-25 DIAGNOSIS — E0590 Thyrotoxicosis, unspecified without thyrotoxic crisis or storm: Secondary | ICD-10-CM | POA: Diagnosis not present

## 2018-02-25 DIAGNOSIS — R55 Syncope and collapse: Secondary | ICD-10-CM | POA: Diagnosis not present

## 2018-02-25 DIAGNOSIS — I10 Essential (primary) hypertension: Secondary | ICD-10-CM | POA: Diagnosis not present

## 2018-02-25 DIAGNOSIS — E039 Hypothyroidism, unspecified: Secondary | ICD-10-CM

## 2018-02-25 NOTE — Telephone Encounter (Signed)
She is on there now. Thanks

## 2018-02-25 NOTE — Addendum Note (Signed)
Addended by: Caprice Beaver T on: 02/25/2018 03:26 PM   Modules accepted: Orders

## 2018-02-25 NOTE — Progress Notes (Signed)
Cardiology Office Note    Date:  02/25/2018   ID:  Katie Kaiser, DOB 05-25-1945, MRN [id number]  PCP:  Katie Ohara, MD  Cardiologist:  Dr. Irish Kaiser  Chief Complaint  Patient presents with  . Near Syncope    denies shortness of breath and chest pain    History of Present Illness:  Katie Kaiser is a 72 y.o. female with PMH of hypertension, GERD, hypothyroidism, overactive bladder and history of aortic stenosis.  Patient has had a heart murmur for the past 20 years.  She had a echocardiogram on 02/18/2017 which showed EF 55 to 60%, grade 1 DD, moderate LVH, very mild aortic stenosis.  Her sister apparently passed away from complications after valve surgery many years ago.  She had a brother who had ventricular tachycardia and also passed away.  She was later seen by hypertension clinic, blood pressure well controlled on lisinopril and hydrochlorothiazide.  Patient apparently had a near syncope this morning and added on my schedule for evaluation of dizziness.  She apparently had 2 presyncopal episode this morning.  Both occurred while she was standing.  There was no change in body position prior to the episode.  She says she had a weird feeling in her chest when the symptom occurred.  The symptom in her chest only last a few seconds.  It is not accompanied by any chest pain.  She recently finished a course of antibiotic after dental extraction.  She denies any obvious dyspnea on exertion recently.  Her O2 saturation is stable at 97%.  Potential differential diagnosis include dehydration and vasovagal episode.  Vertigo is unlikely.  Patient denies any spinning of the room when the symptom occurred.  She says her coworker mention should her face was pale when she had a second episode to this morning.  However they did not check a blood pressure or heart rate.  I suspect she likely had a transient drop in the blood pressure this morning.  Symptom is very atypical for ACS.  EKG does not show  any obvious ST-T wave changes.  Her aortic murmur sounds louder than expected, I will repeat echocardiogram at this time.  I will also obtain a CBC, CMP and a TSH to rule out secondary causes for presyncope.  Otherwise, patient also mentions this is similar to what she has experienced over a year ago.  Given the infrequency of her symptom, I will hold off on a heart monitor at this time.  I recommend her to keep herself hydrated when she get home.  She will also keep a blood pressure diary as well.  I will see her back in 2 weeks for reassessment.  If symptoms does occur again, she will contact us and we will arrange for 30-day event monitor.  Past Medical History:  Diagnosis Date  . Arthritis   . Depression   . Diverticulosis   . Essential hypertension   . GERD (gastroesophageal reflux disease)   . Heart murmur    slight murmur per pt  . Hypothyroid   . Impaired fasting glucose 5/07  . Kidney stone   . OAB (overactive bladder)   . Obesity   . Pneumonia   . Vitamin D deficiency     Past Surgical History:  Procedure Laterality Date  . APPENDECTOMY    . CESAREAN SECTION    . COLONOSCOPY  02/04/2011   diverticulosis  . ORIF FOREARM FRACTURE  2002  . THYROIDECTOMY  1975  . TONSILLECTOMY    .  TOTAL KNEE ARTHROPLASTY Right 11/27/2015   Procedure: TOTAL KNEE ARTHROPLASTY;  Surgeon: Garald Balding, MD;  Location: [facility];  Service: Orthopedics;  Laterality: Right;    Current Medications: Outpatient Medications Prior to Visit  Medication Sig Dispense Refill  . amoxicillin (AMOXIL) 875 MG tablet Take 1 tablet (875 mg total) by mouth 2 (two) times daily. 20 tablet 0  . aspirin EC 81 MG tablet Take 81 mg by mouth daily.    Marland Kitchen buPROPion (WELLBUTRIN XL) 300 MG 24 hr tablet Take 1 tablet (300 mg total) by mouth daily. 90 tablet 3  . Cholecalciferol (VITAMIN D) 1000 UNITS capsule Take 1,000 Units by mouth daily.      Marland Kitchen doxycycline (VIBRAMYCIN) 100 MG capsule Take 100 mg by mouth 2 (two) times  daily.    Marland Kitchen esomeprazole (NEXIUM) 20 MG capsule Take 20 mg by mouth daily at 12 noon.    Marland Kitchen FLUoxetine (PROZAC) 40 MG capsule TAKE 1 CAPSULE BY MOUTH ONCE DAILY 90 capsule 0  . hydrochlorothiazide (HYDRODIURIL) 25 MG tablet TAKE 1 TABLET BY MOUTH ONCE DAILY. PLEASE CALL TO SCHEDULE MED CHECK 90 tablet 0  . HYDROcodone-homatropine (HYCODAN) 5-1.5 MG/5ML syrup Take 5 mLs by mouth at bedtime as needed for cough. 75 mL 0  . lisinopril (PRINIVIL,ZESTRIL) 10 MG tablet Take 1 tablet (10 mg total) by mouth daily. Please make yearly appt with Dr. Irish Kaiser for November before anymore refills. 1st attempt 90 tablet 0  . NON FORMULARY CBD oil    . Omega-3 Fatty Acids (FISH OIL) 1000 MG CAPS Take 2 mg daily by mouth.    . SYNTHROID 200 MCG tablet TAKE 1 TABLET DAILY BEFORE BREAKFAST 90 tablet 1  . benzonatate (TESSALON) 200 MG capsule Take 1 capsule (200 mg total) by mouth 3 (three) times daily as needed for cough. 30 capsule 0  . SYNTHROID 200 MCG tablet TAKE 1 TABLET DAILY BEFORE BREAKFAST 90 tablet 0   No facility-administered medications prior to visit.      Allergies:   Dilaudid [hydromorphone hcl]   Social History   Socioeconomic History  . Marital status: Single    Spouse name: Not on file  . Number of children: 1  . Years of education: Not on file  . Highest education level: Not on file  Occupational History  . Occupation: hairdresser    Employer: [employer]  Social Needs  . Financial resource strain: Not on file  . Food insecurity:    Worry: Not on file    Inability: Not on file  . Transportation needs:    Medical: Not on file    Non-medical: Not on file  Tobacco Use  . Smoking status: Former Smoker    Last attempt to quit: 05/05/1978    Years since quitting: 39.8  . Smokeless tobacco: Never Used  Substance and Sexual Activity  . Alcohol use: Yes    Alcohol/week: 0.0 standard drinks    Comment: a glass of wine or beer once or twice a month or less  . Drug use: No  . Sexual  activity: Not Currently  Lifestyle  . Physical activity:    Days per week: Not on file    Minutes per session: Not on file  . Stress: Not on file  Relationships  . Social connections:    Talks on phone: Not on file    Gets together: Not on file    Attends religious service: Not on file    Active member of club or  organization: Not on file    Attends meetings of clubs or organizations: Not on file    Relationship status: Not on file  Other Topics Concern  . Not on file  Social History Narrative   Lives alone; has a friend subletting her downstairs. 3 dogs. Son lives in [place], [place] granddaughters.   Hair dresser     Family History:  The patient's family history includes Arthritis in her sister and sister; Diabetes in her brother; HIV in her brother; Heart disease in her brother; Parkinsonism in her mother.   ROS:   Please see the history of present illness.    ROS All other systems reviewed and are negative.   PHYSICAL EXAM:   VS:  BP (!) 142/80   Pulse 84   Wt 240 lb (108.9 kg)   LMP 05/05/1990   SpO2 97%   BMI 42.51 kg/m    GEN: Well nourished, well developed, in no acute distress  HEENT: normal  Neck: no JVD, carotid bruits, or masses Cardiac: RRR; no murmurs, rubs, or gallops,no edema  Respiratory:  clear to auscultation bilaterally, normal work of breathing GI: soft, nontender, nondistended, + BS MS: no deformity or atrophy  Skin: warm and dry, no rash Neuro:  Alert and Oriented x 3, Strength and sensation are intact Psych: euthymic mood, full affect  Wt Readings from Last 3 Encounters:  02/25/18 240 lb (108.9 kg)  08/18/17 238 lb (108 kg)  05/06/17 244 lb (110.7 kg)      Studies/Labs Reviewed:   EKG:  EKG is ordered today.  The ekg ordered today demonstrates normal sinus rhythm, no significant ST-T wave changes.  Recent Labs: 03/18/2017: BUN 17; Creatinine, Ser 0.72; Potassium 4.1; Sodium 138   Lipid Panel    Component Value Date/Time   CHOL 173  10/18/2015 0909   TRIG 58 10/18/2015 0909   HDL 56 10/18/2015 0909   CHOLHDL 3.1 10/18/2015 0909   VLDL 12 10/18/2015 0909   LDLCALC 105 10/18/2015 0909    Additional studies/ records that were reviewed today include:   Echo 02/18/2017 LV EF: 55% -   60% Study Conclusions  - Left ventricle: The cavity size was normal. There was moderate   concentric hypertrophy. Systolic function was normal. The   estimated ejection fraction was in the range of 55% to 60%. Wall   motion was normal; there were no regional wall motion   abnormalities. Doppler parameters are consistent with abnormal   left ventricular relaxation (grade 1 diastolic dysfunction).   Doppler parameters are consistent with high ventricular filling   pressure. - Aortic valve: There was very mild stenosis. There was no   regurgitation. - Mitral valve: Transvalvular velocity was within the normal range.   There was no evidence for stenosis. There was trivial   regurgitation. - Left atrium: The atrium was mildly dilated. - Right ventricle: The cavity size was normal. Wall thickness was   normal. Systolic function was normal. - Atrial septum: No defect or patent foramen ovale was identified. - Tricuspid valve: There was no regurgitation.  ASSESSMENT:    1. Near syncope   2. Aortic valve stenosis, etiology of cardiac valve disease unspecified   3. Hypothyroidism, unspecified type   4. Essential hypertension      PLAN:  In order of problems listed above:  1. Presyncope: This apparently has been a chronic issue.  However the frequency is very rare.  The last episode was over a year ago.  This morning she  had another 2 episodes.  I suspect her symptom is related to sudden drop in the blood pressure either related to dehydration versus vasovagal episode.  I recommended for the patient to keep herself hydrated and the keep a blood pressure diary.  I will obtain a TSH, CBC, CMP to rule out secondary causes for presyncope.   Given her history of aortic stenosis, I also recommend a repeat echocardiogram as well.  If her symptom does occur again I would recommend getting a 30-day event monitor to rule out a significant arrhythmia.  Otherwise she denies any chest pain prior to the episode.  Her O2 saturation is normal, suspicion for PE very low.  2. History of aortic stenosis: Mild on last echocardiogram in 2018, will repeat echocardiogram given the recent symptoms  3. Hypertension: Her blood pressure is borderline elevated today, I will hold off on adjusting her blood pressure medication given the recurrence of presyncope this morning.  4. Hypothyroidism: On Synthroid.  Recheck TSH.    Medication Adjustments/Labs and Tests Ordered: Current medicines are reviewed at length with the patient today.  Concerns regarding medicines are outlined above.  Medication changes, Labs and Tests ordered today are listed in the Patient Instructions below. Patient Instructions  Medication Instructions:  No Changes If you need a refill on your cardiac medications before your next appointment, please call your pharmacy.   Lab work: CBC, CMP, TSH If you have labs (blood work) drawn today and your tests are completely normal, you will receive your results only by: Marland Kitchen MyChart Message (if you have MyChart) OR . A paper copy in the mail If you have any lab test that is abnormal or we need to change your treatment, we will call you to review the results.  Testing/Procedures: Your physician has requested that you have an echocardiogram. Echocardiography is a painless test that uses sound waves to create images of your heart. It provides your doctor with information about the size and shape of your heart and how well your heart's chambers and valves are working. This procedure takes approximately one hour. There are no restrictions for this procedure.  Follow-Up: At [facility], you and your health needs are our priority.  As part of  our continuing mission to provide you with exceptional heart care, we have created designated Provider Care Teams.  These Care Teams include your primary Cardiologist (physician) and Advanced Practice Providers (APPs -  Physician Assistants and Nurse Practitioners) who all work together to provide you with the care you need, when you need it. . Follow up in 2 weeks with Dr.Varanasi or Almyra Deforest, PA-C  Any Other Special Instructions Will Be Listed Below (If Applicable). ECHO needs to be before this appointment Bring BP log to next office visit      Signed, Almyra Deforest, [place]  02/25/2018 2:00 PM    [address], [place], [place]  [address] Phone: [phone]; Fax: [phone]

## 2018-02-25 NOTE — Telephone Encounter (Signed)
I don't seen this patient on my list as of this time, did you add her to the schedule

## 2018-02-25 NOTE — Telephone Encounter (Signed)
Spoke with patient who states that she got up this morning, took her regular morning meds. She states that she had a "weird sensation" in her chest that lasted for a few seconds and then went away. She states that she went to work and all of a sudden she developed that "weird sensation" in the center of her chest again and felt like she was going to pass out. She states that her co-workers told her that her face got real white.  She states that she sat down and put her head between her knees and it resolved. She denies having any chest pain, SOB, palpitations, LOC, or any other Sx. Patient states that her SBP has been 120-150s. She states that she has been staying hydrated and that she had a muffin for breakfast this morning. Patient's last echo last year showed mild AS. Appointment made for patient to see Almyra Deforest, PA today at the [facility] office at 1:30 PM.

## 2018-02-25 NOTE — Patient Instructions (Addendum)
Medication Instructions:  No Changes If you need a refill on your cardiac medications before your next appointment, please call your pharmacy.   Lab work: CBC, CMP, TSH If you have labs (blood work) drawn today and your tests are completely normal, you will receive your results only by: Marland Kitchen MyChart Message (if you have MyChart) OR . A paper copy in the mail If you have any lab test that is abnormal or we need to change your treatment, we will call you to review the results.  Testing/Procedures: Your physician has requested that you have an echocardiogram. Echocardiography is a painless test that uses sound waves to create images of your heart. It provides your doctor with information about the size and shape of your heart and how well your heart's chambers and valves are working. This procedure takes approximately one hour. There are no restrictions for this procedure.  Follow-Up: At [facility], you and your health needs are our priority.  As part of our continuing mission to provide you with exceptional heart care, we have created designated Provider Care Teams.  These Care Teams include your primary Cardiologist (physician) and Advanced Practice Providers (APPs -  Physician Assistants and Nurse Practitioners) who all work together to provide you with the care you need, when you need it. . Follow up in 2 weeks with Dr.Varanasi or Almyra Deforest, PA-C  Any Other Special Instructions Will Be Listed Below (If Applicable). ECHO needs to be before this appointment Bring BP log to next office visit

## 2018-02-25 NOTE — Telephone Encounter (Signed)
New Message         STAT if patient feels like he/she is going to faint   1) Are you dizzy now? yes  2) Do you feel faint or have you passed out? Feel Faint, felt something in chest, Did not pass out  3) Do you have any other symptoms? No   4) Have you checked your HR and BP (record if available)? No

## 2018-02-26 LAB — CBC
HEMATOCRIT: 33.4 % — AB (ref 34.0–46.6)
HEMOGLOBIN: 10.9 g/dL — AB (ref 11.1–15.9)
MCH: 26 pg — AB (ref 26.6–33.0)
MCHC: 32.6 g/dL (ref 31.5–35.7)
MCV: 80 fL (ref 79–97)
Platelets: 239 10*3/uL (ref 150–450)
RBC: 4.2 x10E6/uL (ref 3.77–5.28)
RDW: 14.5 % (ref 12.3–15.4)
WBC: 7 10*3/uL (ref 3.4–10.8)

## 2018-02-26 LAB — TSH: TSH: 0.158 u[IU]/mL — ABNORMAL LOW (ref 0.450–4.500)

## 2018-02-26 LAB — COMPREHENSIVE METABOLIC PANEL
A/G RATIO: 1.4 (ref 1.2–2.2)
ALBUMIN: 3.7 g/dL (ref 3.5–4.8)
ALT: 13 IU/L (ref 0–32)
AST: 15 IU/L (ref 0–40)
Alkaline Phosphatase: 110 IU/L (ref 39–117)
BUN/Creatinine Ratio: 21 (ref 12–28)
BUN: 14 mg/dL (ref 8–27)
Bilirubin Total: 0.2 mg/dL (ref 0.0–1.2)
CO2: 24 mmol/L (ref 20–29)
Calcium: 8.9 mg/dL (ref 8.7–10.3)
Chloride: 102 mmol/L (ref 96–106)
Creatinine, Ser: 0.67 mg/dL (ref 0.57–1.00)
GFR, EST AFRICAN AMERICAN: 103 mL/min/{1.73_m2} (ref >59)
GFR, EST NON AFRICAN AMERICAN: 89 mL/min/{1.73_m2} (ref >59)
GLOBULIN, TOTAL: 2.7 g/dL (ref 1.5–4.5)
Glucose: 141 mg/dL — ABNORMAL HIGH (ref 65–99)
Potassium: 4.4 mmol/L (ref 3.5–5.2)
Sodium: 141 mmol/L (ref 134–144)
Total Protein: 6.4 g/dL (ref 6.0–8.5)

## 2018-03-04 ENCOUNTER — Ambulatory Visit: Payer: Medicare Other | Admitting: Physician Assistant

## 2018-03-04 ENCOUNTER — Ambulatory Visit: Payer: Medicare Other | Admitting: Cardiology

## 2018-03-04 NOTE — Progress Notes (Signed)
For some reason, the original lab did not bounce back to me. Renal function and electrolyte ok. Mildly anemic, but this is chronic and stable when compare to last year's lab. TSH is low, this may indicate she is taking too much thyroid medication, recommend further evaluation by primary care provider. Please forward lab result to PCP.

## 2018-03-05 ENCOUNTER — Ambulatory Visit (HOSPITAL_COMMUNITY): Payer: Medicare Other | Attending: Cardiovascular Disease

## 2018-03-05 ENCOUNTER — Other Ambulatory Visit: Payer: Self-pay

## 2018-03-05 DIAGNOSIS — I350 Nonrheumatic aortic (valve) stenosis: Secondary | ICD-10-CM | POA: Diagnosis not present

## 2018-03-11 ENCOUNTER — Other Ambulatory Visit: Payer: Self-pay | Admitting: Family Medicine

## 2018-03-11 DIAGNOSIS — I10 Essential (primary) hypertension: Secondary | ICD-10-CM

## 2018-03-16 ENCOUNTER — Ambulatory Visit: Payer: Medicare Other | Admitting: Physician Assistant

## 2018-03-16 NOTE — Progress Notes (Signed)
Chief Complaint  Patient presents with  . Follow-up    on TSH.    Patient presents to follow up on abnormal thyroid results obtained by the cardiologist.  She saw cardiologist in 10/24 due to presyncopal spells. Cardiologist felt that symptoms may have been related to sudden drop in blood pressure, either related to dehydration versus vasovagal episode. She was reminded to stay well hydrated and the keep a blood pressure diary. He also recommended repeat echocardiogram (has aortic stenosis), and if occurs again, would get a 30-day event monitor to rule out a significant arrhythmia.    Lab Results  Component Value Date   TSH 0.158 (L) 02/25/2018     Chemistry      Component Value Date/Time   NA 141 02/25/2018 1402   K 4.4 02/25/2018 1402   CL 102 02/25/2018 1402   CO2 24 02/25/2018 1402   BUN 14 02/25/2018 1402   CREATININE 0.67 02/25/2018 1402   CREATININE 0.71 02/12/2017 1105      Component Value Date/Time   CALCIUM 8.9 02/25/2018 1402   ALKPHOS 110 02/25/2018 1402   AST 15 02/25/2018 1402   ALT 13 02/25/2018 1402   BILITOT 0.2 02/25/2018 1402     Lab Results  Component Value Date   WBC 7.0 02/25/2018   HGB 10.9 (L) 02/25/2018   HCT 33.4 (L) 02/25/2018   MCV 80 02/25/2018   PLT 239 02/25/2018   Echo 03/2018: Study Conclusions - Left ventricle: The cavity size was normal. There was mild   concentric hypertrophy. Systolic function was normal. The   estimated ejection fraction was in the range of 60% to 65%. Wall   motion was normal; there were no regional wall motion   abnormalities. Doppler parameters are consistent with abnormal   left ventricular relaxation (grade 1 diastolic dysfunction). - Aortic valve: Not well visualized. There was mild stenosis. - Left atrium: The atrium was mildly dilated.  It was not felt that the mild AS was cause of her presyncope.  With respect to her hypothyroidism, her current dose is 233mcg daily.  Her last Wellness Visit was  02/2017. TSH at that time was 0.60. She reports compliance with her meds, on branded medication. Denies any symptoms related to her thyroid--no changes in energy/mood/hair/skin/nails/bowels.  No weight loss--slight gain per pt, but really no change in the last 6 months.  She is complaining of leakage of urine, and frequency, getting up 2-3 times at night. Doesn't drink caffeine, limits fluids at night.  No dysuria or odor. Has leakage with urgency, as well as with cough/sneeze. She is starting to get very frustrated by this, especially the fact that it wakes her up so often at night.  She is interested in trying medications. She is on diuretic for hypertension, but takes in the morning.  Nighttime symptoms are bothersome to her.  Hypertension: sometimes BP is up to 180 when she first wakes up, before taking her meds. Later in the day it drops to 140, sometimes goes down to 120.  She is keeping a list for her cardiologist, who she sees tomorrow. She is on UCTZ, and 10mg  lisinopril was added by Dr. Irish Lack in 03/2017.    No further presyncopal spells.  PMH, PSH, SH reviewed  Outpatient Encounter Medications as of 03/17/2018  Medication Sig  . aspirin EC 81 MG tablet Take 81 mg by mouth daily.  Marland Kitchen buPROPion (WELLBUTRIN XL) 300 MG 24 hr tablet Take 1 tablet (300 mg total) by mouth daily.  Marland Kitchen  Cholecalciferol (VITAMIN D) 1000 UNITS capsule Take 1,000 Units by mouth daily.    Marland Kitchen esomeprazole (NEXIUM) 20 MG capsule Take 20 mg by mouth daily at 12 noon.  Marland Kitchen FLUoxetine (PROZAC) 40 MG capsule TAKE 1 CAPSULE BY MOUTH ONCE DAILY  . hydrochlorothiazide (HYDRODIURIL) 25 MG tablet TAKE 1 TABLET BY MOUTH ONCE DAILY  . lisinopril (PRINIVIL,ZESTRIL) 10 MG tablet Take 1 tablet (10 mg total) by mouth daily. Please make yearly appt with Dr. Irish Lack for November before anymore refills. 1st attempt  . Omega-3 Fatty Acids (FISH OIL) 1000 MG CAPS Take 2 mg daily by mouth.  . [DISCONTINUED] SYNTHROID 200 MCG tablet TAKE 1  TABLET DAILY BEFORE BREAKFAST  . NON FORMULARY CBD oil  . SYNTHROID 175 MCG tablet Take 1 tablet (175 mcg total) by mouth daily before breakfast.  . tolterodine (DETROL LA) 2 MG 24 hr capsule Take 1 capsule (2 mg total) by mouth daily.  . [DISCONTINUED] amoxicillin (AMOXIL) 875 MG tablet Take 1 tablet (875 mg total) by mouth 2 (two) times daily.  . [DISCONTINUED] doxycycline (VIBRAMYCIN) 100 MG capsule Take 100 mg by mouth 2 (two) times daily.  . [DISCONTINUED] HYDROcodone-homatropine (HYCODAN) 5-1.5 MG/5ML syrup Take 5 mLs by mouth at bedtime as needed for cough.   No facility-administered encounter medications on file as of 03/17/2018.    (was NOT taking detrol prior to today; dose of Synthroid was 261mcg prior to today).  Allergies  Allergen Reactions  . Dilaudid [Hydromorphone Hcl]     Lots of itching    ROS: no fever, chills, headaches. No further presyncopal spells. No allergy or cold symptoms, chest pain, shortness of breath, edema (wears compression socks).  Moods are good.  Denies changes to hair/skin/bowels, moods, energy, palpitations or weight changes.   PHYSICAL EXAM:  BP (!) 150/80   Pulse 84   Ht 5\' 3"  (1.6 m)   Wt 238 lb 6.4 oz (108.1 kg)   LMP 05/05/1990   BMI 42.23 kg/m   148/68 on repeat by MD  Wt Readings from Last 3 Encounters:  03/17/18 238 lb 6.4 oz (108.1 kg)  02/25/18 240 lb (108.9 kg)  08/18/17 238 lb (108 kg)   Well appearing, pleasant, obese female, in no distress.  Appears somewhat tired today HEENT: conjunctiva and sclera are clear, OP clear Neck: no lymphadenopathy ,thyromegaly or carotid briud Heart: regular rate and rhythm, slight murmur at RUSB. No ectopy Lungs: clear bilaterally Back: no spinal or CVA tenderness Abdomen: soft, nontender, no organomegaly or mass Extremities: no edema Psych: normal mood, affect, hygiene and grooming Skin: normal turgor, no rash   ASSESSMENT/PLAN:  Hypothyroidism, unspecified type - over-replaced per  recent labs. Minimally symptomatic. Lower dose to 175 and recheck 6 weeks. - Plan: SYNTHROID 175 MCG tablet, TSH  Essential hypertension - suboptimally controlled--has appt with cardiologist tomorrow, will let them adjust meds. ?if diuretic is exacerbating her urinary complaints  Mild aortic stenosis  Need for influenza vaccination - Plan: Flu vaccine HIGH DOSE PF (Fluzone High dose)  Urinary incontinence in female - discussed medication risks/SE in detail, to start low ose Detrol (if diuretic not stopped to see if that helps alone) - Plan: tolterodine (DETROL LA) 2 MG 24 hr capsule   Lower Synthroid dose to 175 mcg. Continue to take it on an empty stomach and separate from vitamins/medications. Return in 6-8 weeks for recheck of TSH.  Your HCTZ is a diuretic that may be exacerbating your urinary leakage. We discussed starting a medication for  overactive bladder, but you may also want to discuss your blood pressure medications, to see if a change might prevent you from needing to take another daily medication (especially if medications are going to be adjusted anyway, if blood pressure is above goal).  If he feels like you should start the bladder medication (not changing your diuretic), then start taking it once daily.  Let us know if it isn't working well enough, or if side effects are bad (dry mouth, constipation)--the side effects could limit [place] from increasing the dose.

## 2018-03-17 ENCOUNTER — Ambulatory Visit (INDEPENDENT_AMBULATORY_CARE_PROVIDER_SITE_OTHER): Payer: Medicare Other | Admitting: Family Medicine

## 2018-03-17 ENCOUNTER — Encounter: Payer: Self-pay | Admitting: Family Medicine

## 2018-03-17 VITALS — BP 148/68 | HR 84 | Ht 63.0 in | Wt 238.4 lb

## 2018-03-17 DIAGNOSIS — Z23 Encounter for immunization: Secondary | ICD-10-CM | POA: Diagnosis not present

## 2018-03-17 DIAGNOSIS — E039 Hypothyroidism, unspecified: Secondary | ICD-10-CM | POA: Diagnosis not present

## 2018-03-17 DIAGNOSIS — R32 Unspecified urinary incontinence: Secondary | ICD-10-CM | POA: Diagnosis not present

## 2018-03-17 DIAGNOSIS — I350 Nonrheumatic aortic (valve) stenosis: Secondary | ICD-10-CM | POA: Diagnosis not present

## 2018-03-17 DIAGNOSIS — I10 Essential (primary) hypertension: Secondary | ICD-10-CM

## 2018-03-17 MED ORDER — TOLTERODINE TARTRATE ER 2 MG PO CP24: 2.0000 mg | ORAL_CAPSULE | Freq: Every day | ORAL | 1 refills | Status: DC

## 2018-03-17 MED ORDER — SYNTHROID 175 MCG PO TABS: 175.0000 ug | ORAL_TABLET | Freq: Every day | ORAL | 1 refills | Status: DC

## 2018-03-17 NOTE — Patient Instructions (Signed)
  Lower Synthroid dose to 175 mcg. Continue to take it on an empty stomach and separate from vitamins/medications. Return in 6-8 weeks for recheck of TSH.  Your HCTZ is a diuretic that may be exacerbating your urinary leakage. We discussed starting a medication for overactive bladder, but you may also want to discuss your blood pressure medications, to see if a change might prevent you from needing to take another daily medication (especially if medications are going to be adjusted anyway, if blood pressure is above goal).  If he feels like you should start the bladder medication (not changing your diuretic), then start taking it once daily.  Let us know if it isn't working well enough, or if side effects are bad (dry mouth, constipation)--the side effects could limit [place] from increasing the dose.

## 2018-03-18 ENCOUNTER — Encounter: Payer: Self-pay | Admitting: Physician Assistant

## 2018-03-18 ENCOUNTER — Ambulatory Visit (INDEPENDENT_AMBULATORY_CARE_PROVIDER_SITE_OTHER): Payer: Medicare Other | Admitting: Physician Assistant

## 2018-03-18 VITALS — BP 158/68 | HR 79 | Ht 66.0 in | Wt 237.4 lb

## 2018-03-18 DIAGNOSIS — R42 Dizziness and giddiness: Secondary | ICD-10-CM

## 2018-03-18 DIAGNOSIS — I10 Essential (primary) hypertension: Secondary | ICD-10-CM

## 2018-03-18 DIAGNOSIS — I350 Nonrheumatic aortic (valve) stenosis: Secondary | ICD-10-CM | POA: Diagnosis not present

## 2018-03-18 DIAGNOSIS — E039 Hypothyroidism, unspecified: Secondary | ICD-10-CM

## 2018-03-18 MED ORDER — LISINOPRIL 20 MG PO TABS: 20.0000 mg | ORAL_TABLET | Freq: Every evening | ORAL | 6 refills | Status: DC

## 2018-03-18 NOTE — Progress Notes (Signed)
Cardiology Office Note    Date:  03/20/2018   ID:  Katie Kaiser, DOB 1945-09-11, MRN [id number]  PCP:  Rita Ohara, MD  Cardiologist:  Dr. Irish Lack  Chief Complaint  Patient presents with  . Follow-up    followup on presyncope    History of Present Illness:  Katie Kaiser is a 72 y.o. female with PMH of hypertension, GERD, hypothyroidism, overactive bladder and history of aortic stenosis.  Patient has had a heart murmur for the past 20 years.  She had a echocardiogram on 02/18/2017 which showed EF 55 to 60%, grade 1 DD, moderate LVH, very mild aortic stenosis.  Her sister apparently passed away from complications after valve surgery many years ago.  She had a brother who had ventricular tachycardia and also passed away.  She was later seen by hypertension clinic, blood pressure well controlled on lisinopril and hydrochlorothiazide.  I last saw the patient on 02/25/2018 for near syncope.  She had 2 episodes of presyncope in the morning of the visit.  Both occurred while she was standing.  There was no change in the body position prior to the episode.  She had a weird feeling in her chest when the symptom occurred.  The symptom in her chest only lasted a few seconds and it is not accompanied by any chest pain.  Prior to the visit, she had a course of antibiotic after dental extraction.  O2 saturation in the office was stable.  Differential diagnosis include dehydration and vasovagal episode.  I suspect that she had a transient drop in the blood pressure.  Symptom was very atypical for ACS.  EKG did not show any obvious ST-T wave changes.  Lab work showed normal CBC and CMP, however TSH was borderline low.  I repeated echocardiogram on 03/05/2018 which showed EF 60 to 65%, grade 1 DD, mild aortic stenosis, mild LAE.  Nothing to explain her recent syncope.  Patient presents today for cardiology follow-up.  She has not had any further dizzy spell or presyncope.  She does not have any recent  chest pain or shortness of breath.  Overall she is doing well.  At this point I do not recommend any further work-up and I would recommend continue observation.  She has no lower extremity edema, orthopnea or PND.  Her blood pressure is elevated, I would increase lisinopril to 20 mg daily.   Past Medical History:  Diagnosis Date  . Arthritis   . Depression   . Diverticulosis   . Essential hypertension   . GERD (gastroesophageal reflux disease)   . Heart murmur    slight murmur per pt  . Hypothyroid   . Impaired fasting glucose 5/07  . Kidney stone   . OAB (overactive bladder)   . Obesity   . Pneumonia   . Vitamin D deficiency     Past Surgical History:  Procedure Laterality Date  . APPENDECTOMY    . CESAREAN SECTION    . COLONOSCOPY  02/04/2011   diverticulosis  . ORIF FOREARM FRACTURE  2002  . THYROIDECTOMY  1975  . TONSILLECTOMY    . TOTAL KNEE ARTHROPLASTY Right 11/27/2015   Procedure: TOTAL KNEE ARTHROPLASTY;  Surgeon: Garald Balding, MD;  Location: [facility];  Service: Orthopedics;  Laterality: Right;    Current Medications: Outpatient Medications Prior to Visit  Medication Sig Dispense Refill  . aspirin EC 81 MG tablet Take 81 mg by mouth daily.    Marland Kitchen buPROPion (WELLBUTRIN XL) 300 MG  24 hr tablet Take 1 tablet (300 mg total) by mouth daily. 90 tablet 3  . Cholecalciferol (VITAMIN D) 1000 UNITS capsule Take 1,000 Units by mouth daily.      Marland Kitchen esomeprazole (NEXIUM) 20 MG capsule Take 20 mg by mouth daily at 12 noon.    Marland Kitchen FLUoxetine (PROZAC) 40 MG capsule TAKE 1 CAPSULE BY MOUTH ONCE DAILY 90 capsule 0  . hydrochlorothiazide (HYDRODIURIL) 25 MG tablet TAKE 1 TABLET BY MOUTH ONCE DAILY 90 tablet 0  . NON FORMULARY CBD oil    . Omega-3 Fatty Acids (FISH OIL) 1000 MG CAPS Take 2 mg daily by mouth.    . SYNTHROID 175 MCG tablet Take 1 tablet (175 mcg total) by mouth daily before breakfast. 30 tablet 1  . tolterodine (DETROL LA) 2 MG 24 hr capsule Take 1 capsule (2 mg total) by  mouth daily. 30 capsule 1  . lisinopril (PRINIVIL,ZESTRIL) 10 MG tablet Take 1 tablet (10 mg total) by mouth daily. Please make yearly appt with Dr. Irish Lack for November before anymore refills. 1st attempt 90 tablet 0   No facility-administered medications prior to visit.      Allergies:   Dilaudid [hydromorphone hcl]   Social History   Socioeconomic History  . Marital status: Single    Spouse name: Not on file  . Number of children: 1  . Years of education: Not on file  . Highest education level: Not on file  Occupational History  . Occupation: hairdresser    Employer: [employer]  Social Needs  . Financial resource strain: Not on file  . Food insecurity:    Worry: Not on file    Inability: Not on file  . Transportation needs:    Medical: Not on file    Non-medical: Not on file  Tobacco Use  . Smoking status: Former Smoker    Last attempt to quit: 05/05/1978    Years since quitting: 39.9  . Smokeless tobacco: Never Used  Substance and Sexual Activity  . Alcohol use: Yes    Alcohol/week: 0.0 standard drinks    Comment: a glass of wine or beer once or twice a month or less  . Drug use: No  . Sexual activity: Not Currently  Lifestyle  . Physical activity:    Days per week: Not on file    Minutes per session: Not on file  . Stress: Not on file  Relationships  . Social connections:    Talks on phone: Not on file    Gets together: Not on file    Attends religious service: Not on file    Active member of club or organization: Not on file    Attends meetings of clubs or organizations: Not on file    Relationship status: Not on file  Other Topics Concern  . Not on file  Social History Narrative   Lives alone; has a friend subletting her downstairs. 3 dogs. Son lives in [place], [place] granddaughters.   Hair dresser     Family History:  The patient's family history includes Arthritis in her sister and sister; Diabetes in her brother; HIV in her brother; Heart disease in  her brother; Parkinsonism in her mother.   ROS:   Please see the history of present illness.    ROS All other systems reviewed and are negative.   PHYSICAL EXAM:   VS:  BP (!) 158/68   Pulse 79   Ht 5\' 6"  (1.676 m)   Wt 237 lb 6.4  oz (107.7 kg)   LMP 05/05/1990   BMI 38.32 kg/m    GEN: Well nourished, well developed, in no acute distress  HEENT: normal  Neck: no JVD, carotid bruits, or masses Cardiac: RRR; no murmurs, rubs, or gallops,no edema  Respiratory:  clear to auscultation bilaterally, normal work of breathing GI: soft, nontender, nondistended, + BS MS: no deformity or atrophy  Skin: warm and dry, no rash Neuro:  Alert and Oriented x 3, Strength and sensation are intact Psych: euthymic mood, full affect  Wt Readings from Last 3 Encounters:  03/18/18 237 lb 6.4 oz (107.7 kg)  03/17/18 238 lb 6.4 oz (108.1 kg)  02/25/18 240 lb (108.9 kg)      Studies/Labs Reviewed:   EKG:  EKG is not ordered today.    Recent Labs: 02/25/2018: ALT 13; BUN 14; Creatinine, Ser 0.67; Hemoglobin 10.9; Platelets 239; Potassium 4.4; Sodium 141; TSH 0.158   Lipid Panel    Component Value Date/Time   CHOL 173 10/18/2015 0909   TRIG 58 10/18/2015 0909   HDL 56 10/18/2015 0909   CHOLHDL 3.1 10/18/2015 0909   VLDL 12 10/18/2015 0909   LDLCALC 105 10/18/2015 0909    Additional studies/ records that were reviewed today include:   Echo 03/05/2018 LV EF: 60% -   65%  Study Conclusions  - Left ventricle: The cavity size was normal. There was mild   concentric hypertrophy. Systolic function was normal. The   estimated ejection fraction was in the range of 60% to 65%. Wall   motion was normal; there were no regional wall motion   abnormalities. Doppler parameters are consistent with abnormal   left ventricular relaxation (grade 1 diastolic dysfunction). - Aortic valve: Not well visualized. There was mild stenosis. - Left atrium: The atrium was mildly dilated.    ASSESSMENT:     1. Dizziness   2. Essential hypertension   3. Hypothyroidism, unspecified type   4. Nonrheumatic aortic valve stenosis      PLAN:  In order of problems listed above:  1. Presyncope: She has not had any further episodes in the past 2 weeks.  At this point I would do not recommend any further work-up.  2. Hypertension: Blood pressure has been elevated, increase lisinopril to 20 mg daily taken at bedtime  3. Hypothyroidism: Managed by primary care provider  4. Aortic valve stenosis: Recent echocardiogram showed mild aortic stenosis unchanged from the previous study.    Medication Adjustments/Labs and Tests Ordered: Current medicines are reviewed at length with the patient today.  Concerns regarding medicines are outlined above.  Medication changes, Labs and Tests ordered today are listed in the Patient Instructions below. Patient Instructions  Medication Instructions:  INCREASE Lisinopril to 20mg  take 1 tablet daily at bedtime Take 1 extra tablet on Lisinopril 10mg  then start Lisinopril 20mg  tomorrow  If you need a refill on your cardiac medications before your next appointment, please call your pharmacy.   Lab work: None  If you have labs (blood work) drawn today and your tests are completely normal, you will receive your results only by: Marland Kitchen MyChart Message (if you have MyChart) OR . A paper copy in the mail If you have any lab test that is abnormal or we need to change your treatment, we will call you to review the results.  Testing/Procedures: None  Follow-Up: At [facility], you and your health needs are our priority.  As part of our continuing mission to provide you with exceptional heart  care, we have created designated Provider Care Teams.  These Care Teams include your primary Cardiologist (physician) and Advanced Practice Providers (APPs -  Physician Assistants and Nurse Practitioners) who all work together to provide you with the care you need, when you need  it. . Your physician recommends that you schedule a follow-up appointment in: 3 months with Dr Irish Lack.  Any Other Special Instructions Will Be Listed Below (If Applicable).      Hilbert Corrigan, [place]  03/20/2018 11:37 PM    [address], [place], [place]  [address] Phone: [phone]; Fax: [phone]

## 2018-03-18 NOTE — Patient Instructions (Addendum)
Medication Instructions:  INCREASE Lisinopril to 20mg  take 1 tablet daily at bedtime Take 1 extra tablet on Lisinopril 10mg  then start Lisinopril 20mg  tomorrow  If you need a refill on your cardiac medications before your next appointment, please call your pharmacy.   Lab work: None  If you have labs (blood work) drawn today and your tests are completely normal, you will receive your results only by: Marland Kitchen MyChart Message (if you have MyChart) OR . A paper copy in the mail If you have any lab test that is abnormal or we need to change your treatment, we will call you to review the results.  Testing/Procedures: None  Follow-Up: At [facility], you and your health needs are our priority.  As part of our continuing mission to provide you with exceptional heart care, we have created designated Provider Care Teams.  These Care Teams include your primary Cardiologist (physician) and Advanced Practice Providers (APPs -  Physician Assistants and Nurse Practitioners) who all work together to provide you with the care you need, when you need it. . Your physician recommends that you schedule a follow-up appointment in: 3 months with Dr Irish Lack.  Any Other Special Instructions Will Be Listed Below (If Applicable).

## 2018-03-20 ENCOUNTER — Other Ambulatory Visit: Payer: Self-pay | Admitting: Family Medicine

## 2018-03-20 ENCOUNTER — Encounter: Payer: Self-pay | Admitting: Physician Assistant

## 2018-03-20 DIAGNOSIS — F325 Major depressive disorder, single episode, in full remission: Secondary | ICD-10-CM

## 2018-03-24 ENCOUNTER — Other Ambulatory Visit: Payer: Self-pay

## 2018-04-07 ENCOUNTER — Encounter: Payer: Self-pay | Admitting: Family Medicine

## 2018-04-07 ENCOUNTER — Ambulatory Visit (INDEPENDENT_AMBULATORY_CARE_PROVIDER_SITE_OTHER): Payer: Medicare Other | Admitting: Family Medicine

## 2018-04-07 VITALS — BP 132/80 | HR 76 | Ht 66.0 in | Wt 241.0 lb

## 2018-04-07 DIAGNOSIS — S60419A Abrasion of unspecified finger, initial encounter: Secondary | ICD-10-CM

## 2018-04-07 DIAGNOSIS — I10 Essential (primary) hypertension: Secondary | ICD-10-CM

## 2018-04-07 NOTE — Progress Notes (Signed)
Chief Complaint  Patient presents with  . Laceration    right pinky finger is currently bleeding from top of can. Cut finger this morning at 7:00 while opening a can of dog food.    Patient presents with bleeding from her right pinkie finger.  She sliced it superficially while opening a can of dog food earlier this morning, and it has continued to bleed.  The cut the 4th finger also, but that did stop. She denies any significant pain. She takes aspirin. Tetanus is UTD.  Hypertension-- Changed lisinopril to evening, dose increased by cardiologist.  Blood pressure has improved. Trying to stay better hydrated.  No longer having dizziness.   PMH, PSH, SH reviewed  Outpatient Encounter Medications as of 04/07/2018  Medication Sig  . aspirin EC 81 MG tablet Take 81 mg by mouth daily.  Marland Kitchen buPROPion (WELLBUTRIN XL) 300 MG 24 hr tablet TAKE 1 TABLET BY MOUTH DAILY  . Cholecalciferol (VITAMIN D) 1000 UNITS capsule Take 1,000 Units by mouth daily.    Marland Kitchen esomeprazole (NEXIUM) 20 MG capsule Take 20 mg by mouth daily at 12 noon.  Marland Kitchen FLUoxetine (PROZAC) 40 MG capsule TAKE 1 CAPSULE BY MOUTH ONCE DAILY  . hydrochlorothiazide (HYDRODIURIL) 25 MG tablet TAKE 1 TABLET BY MOUTH ONCE DAILY  . lisinopril (PRINIVIL,ZESTRIL) 20 MG tablet Take 1 tablet (20 mg total) by mouth every evening.  . NON FORMULARY CBD oil  . Omega-3 Fatty Acids (FISH OIL) 1000 MG CAPS Take 2 mg daily by mouth.  . SYNTHROID 175 MCG tablet Take 1 tablet (175 mcg total) by mouth daily before breakfast.  . tolterodine (DETROL LA) 2 MG 24 hr capsule Take 1 capsule (2 mg total) by mouth daily.   No facility-administered encounter medications on file as of 04/07/2018.    Allergies  Allergen Reactions  . Dilaudid [Hydromorphone Hcl]     Lots of itching    ROS: no fever, chills, headaches, dizziness, chest pain, rash, URI symptoms or other complaints except as noted in HPI   PHYSICAL EXAM:  BP 132/80   Pulse 76   Ht 5\' 6"  (1.676 m)    Wt 241 lb (109.3 kg)   LMP 05/05/1990   BMI 38.90 kg/m   Well-appearing, pleasant female in good spirits, holding a washcloth around her right 5th finger.  R 5th finger, dorsal surface, overlying the DIP joint there is a very superficial abrasion (thin slice of skin removed), just under 1 cm in size. Bleeding stops with pressure, resumes oozing later in the visit, when uncvovered without pressure. Right 4th finger has a small linear abrasion that is not bleeding. There is brisk capillary refill, normal sensation, FROM.  ASSESSMENT/PLAN:  Abrasion, finger w/o infection  Essential hypertension - controlled   Wound was cleansed, Bacitracin was applied, along with pressure dressing, and splinted to avoid flexing of the DIP  S/sx infection reviewed. Return if these develop, or if bleeding persists.    Keep bandage in place, and keep finger straight (splinted) until tomorrow). If any recurrent bleeding tomorrow, may need to treat topically to stop the oozing (ie silver nitrate).

## 2018-04-07 NOTE — Patient Instructions (Signed)
  Keep bandage in place, and keep finger straight (splinted) until tomorrow). If any recurrent bleeding tomorrow, may need to treat topically to stop the oozing (ie silver nitrate).

## 2018-05-12 ENCOUNTER — Telehealth: Payer: Self-pay | Admitting: Family Medicine

## 2018-05-12 ENCOUNTER — Other Ambulatory Visit: Payer: Medicare Other

## 2018-05-12 DIAGNOSIS — E039 Hypothyroidism, unspecified: Secondary | ICD-10-CM

## 2018-05-12 NOTE — Telephone Encounter (Signed)
Pt called and wanted to ask some questions about her medication. Pt can be reached at [phone]

## 2018-05-12 NOTE — Telephone Encounter (Signed)
Please call pt and see what her questions/concerns are

## 2018-05-13 ENCOUNTER — Other Ambulatory Visit: Payer: Self-pay | Admitting: *Deleted

## 2018-05-13 DIAGNOSIS — E039 Hypothyroidism, unspecified: Secondary | ICD-10-CM

## 2018-05-13 LAB — TSH: TSH: 0.681 u[IU]/mL (ref 0.450–4.500)

## 2018-05-13 MED ORDER — SYNTHROID 175 MCG PO TABS: 175.0000 ug | ORAL_TABLET | Freq: Every day | ORAL | 5 refills | Status: DC

## 2018-05-13 NOTE — Telephone Encounter (Signed)
Please call pt and ask what medication she has questions about and concerns

## 2018-05-13 NOTE — Telephone Encounter (Signed)
Patient will be in Monday @ 3:00PM.

## 2018-05-13 NOTE — Telephone Encounter (Signed)
Changes in psych meds require OV to discuss. We haven't discussed her depression in a long time. She is scheduled for Medicare wellness visit on 1/22--not sure if she wants to wait, if she does, she should fill the prozac in the interim

## 2018-05-13 NOTE — Telephone Encounter (Signed)
Called pt and and she stated that she wanted to try a different medication other than her Prozac. She felt like because shes been on it so long its not working the way it should be. She also stated that she has not picked up her Prozac from the pharmacy yet

## 2018-05-17 ENCOUNTER — Ambulatory Visit (INDEPENDENT_AMBULATORY_CARE_PROVIDER_SITE_OTHER): Payer: Medicare Other | Admitting: Family Medicine

## 2018-05-17 ENCOUNTER — Encounter: Payer: Self-pay | Admitting: Family Medicine

## 2018-05-17 VITALS — BP 138/70 | HR 80 | Ht 66.0 in | Wt 240.8 lb

## 2018-05-17 DIAGNOSIS — F331 Major depressive disorder, recurrent, moderate: Secondary | ICD-10-CM | POA: Diagnosis not present

## 2018-05-17 MED ORDER — ARIPIPRAZOLE 2 MG PO TABS: 2.0000 mg | ORAL_TABLET | Freq: Every day | ORAL | 1 refills | Status: DC

## 2018-05-17 MED ORDER — FLUOXETINE HCL 40 MG PO CAPS: 40.0000 mg | ORAL_CAPSULE | Freq: Every day | ORAL | 0 refills | Status: DC

## 2018-05-17 NOTE — Progress Notes (Signed)
Chief Complaint  Patient presents with  . Consult    thinks she may need a change in her prozac, feels like it is no longer working.    The last few months she has no desire to do anything. "I've lost my pizzazz". Not depressed, but has the "doldrums" Doesn't look forward to much.  She stopped her prozac 3 days ago, when she ran out. She thinks it isn't working as well, and would like to switch it to something else. She is also on Wellbutrin--likes taking that medication.  Pt reports she doesn't have part D coverage for her medications.  Hypothyroidism--TSH was recently checked on the 175 mcg dose.:  Lab Results  Component Value Date   TSH 0.681 05/12/2018   PMH, PSH, SH reviewed  Outpatient Encounter Medications as of 05/17/2018  Medication Sig  . aspirin EC 81 MG tablet Take 81 mg by mouth daily.  Marland Kitchen buPROPion (WELLBUTRIN XL) 300 MG 24 hr tablet TAKE 1 TABLET BY MOUTH DAILY  . Cholecalciferol (VITAMIN D) 1000 UNITS capsule Take 1,000 Units by mouth daily.    Marland Kitchen esomeprazole (NEXIUM) 20 MG capsule Take 20 mg by mouth daily at 12 noon.  Marland Kitchen FLUoxetine (PROZAC) 40 MG capsule Take 1 capsule (40 mg total) by mouth daily.  . hydrochlorothiazide (HYDRODIURIL) 25 MG tablet TAKE 1 TABLET BY MOUTH ONCE DAILY  . lisinopril (PRINIVIL,ZESTRIL) 20 MG tablet Take 1 tablet (20 mg total) by mouth every evening.  . Omega-3 Fatty Acids (FISH OIL) 1000 MG CAPS Take 2 mg daily by mouth.  . SYNTHROID 175 MCG tablet Take 1 tablet (175 mcg total) by mouth daily before breakfast.  . [DISCONTINUED] FLUoxetine (PROZAC) 40 MG capsule TAKE 1 CAPSULE BY MOUTH ONCE DAILY  . ARIPiprazole (ABILIFY) 2 MG tablet Take 1 tablet (2 mg total) by mouth daily.  Marland Kitchen tolterodine (DETROL LA) 2 MG 24 hr capsule Take 1 capsule (2 mg total) by mouth daily. (Patient not taking: Reported on 05/17/2018)  . [DISCONTINUED] NON FORMULARY CBD oil   No facility-administered encounter medications on file as of 05/17/2018.    Allergies   Allergen Reactions  . Dilaudid [Hydromorphone Hcl]     Lots of itching    ROS:  No URI symptoms, no hair/skin/bowel changes. +fatigue, depression per HPI.  No weight changes, normal appetite.   PHYSICAL EXAM:  BP 138/70   Pulse 80   Ht 5\' 6"  (1.676 m)   Wt 240 lb 12.8 oz (109.2 kg)   LMP 05/05/1990   BMI 38.87 kg/m   Wt Readings from Last 3 Encounters:  05/17/18 240 lb 12.8 oz (109.2 kg)  04/07/18 241 lb (109.3 kg)  03/18/18 237 lb 6.4 oz (107.7 kg)   Well appearing, pleasant female, in no distress Normal hygiene, grooming, eye contact and speech.  She has full range of affect, mildly depressed.  Tremor noted at rest to her left thumb, just when arm was in a certain position (related to prior injury/surgery). Resolved with position change. WHSS left forearm   PHQ-9 score of 10  ASSESSMENT/PLAN:  Moderate episode of recurrent major depressive disorder (HCC) - Plan: ARIPiprazole (ABILIFY) 2 MG tablet, FLUoxetine (PROZAC) 40 MG capsule  She wanted to switch from prozac to another med. Discussed need to let is wash out some, and that another SSRI may not be more effective (but could try lexapro).  Other options could include trintellix and others, but cost is a factor. Therefore, since has been doing well on this regimen  for quite a while, and only recently less effective (also during the winter), I prefer to add Abilify to augment her current med regimen, rather than switching SSRI.  (that would also probably have her get worse before getting better, as there would need to be some washout, and start the lexapro at lower dose and need to titrate up). She is agreeable to this plan. Cost may be a factor.   I would like for you to continue BOTH the wellbutrin 300mg  AND the Prozac 40mg . I would like to ADD 2mg  of Abilify once daily (take at night if it makes you sleepy, take in the morning if it causes insomnia).  If you need to pay out of pocket and it is expensive, let's try  small quantities to see how you tolerate it before prescribing larger amounts. For example--maybe just pick up 5 pills first--see how it works (and cut it in half if you have significant side effects). If you tolerate the 2mg  without any problems, then it might save money to write for the 5mg  tablets and have you cut those in half.  If this isn't working, then I highly recommended getting a psychiatrist involved in managing the medications. Dr. Toy Care, Dr. Clovis Pu and Dr. Loni Muse Darleene Cleaver??) are ones that my patients are happy with.  F/u as scheduled for wellness visit 1/22.

## 2018-05-17 NOTE — Patient Instructions (Signed)
  I would like for you to continue BOTH the wellbutrin 300mg  AND the Prozac 40mg . I would like to ADD 2mg  of Abilify once daily (take at night if it makes you sleepy, take in the morning if it causes insomnia).  If you need to pay out of pocket and it is expensive, let's try at small quantities to see how you tolerate it before prescribing larger amounts. For example--maybe just pick up 5 pills first--see how it works (and cut it in half if you have significant side effects). If you tolerate the 2mg  without any problems, then it might save money to write for the 5mg  tablets and have you cut those in half.  If this isn't working, then I highly recommended getting a psychiatrist involved in managing the medications. Dr. Toy Care, Dr. Clovis Pu and Dr. Loni Muse Darleene Cleaver??) are ones that my patients are happy with.

## 2018-05-25 NOTE — Patient Instructions (Addendum)
HEALTH MAINTENANCE RECOMMENDATIONS:  It is recommended that you get at least 30 minutes of aerobic exercise at least 5 days/week (for weight loss, you may need as much as 60-90 minutes). This can be any activity that gets your heart rate up. This can be divided in 10-15 minute intervals if needed, but try and build up your endurance at least once a week.  Weight bearing exercise is also recommended twice weekly.  Eat a healthy diet with lots of vegetables, fruits and fiber.  "Colorful" foods have a lot of vitamins (ie green vegetables, tomatoes, red peppers, etc).  Limit sweet tea, regular sodas and alcoholic beverages, all of which has a lot of calories and sugar.  Up to 1 alcoholic drink daily may be beneficial for women (unless trying to lose weight, watch sugars).  Drink a lot of water.  Calcium recommendations are 1200-1500 mg daily (1500 mg for postmenopausal women or women without ovaries), and vitamin D 1000 IU daily.  This should be obtained from diet and/or supplements (vitamins), and calcium should not be taken all at once, but in divided doses.  Monthly self breast exams and yearly mammograms for women over the age of 5 is recommended.  Sunscreen of at least SPF 30 should be used on all sun-exposed parts of the skin when outside between the hours of 10 am and 4 pm (not just when at beach or pool, but even with exercise, golf, tennis, and yard work!)  Use a sunscreen that says "broad spectrum" so it covers both UVA and UVB rays, and make sure to reapply every 1-2 hours.  Remember to change the batteries in your smoke detectors when changing your clock times in the spring and fall.  Use your seat belt every time you are in a car, and please drive safely and not be distracted with cell phones and texting while driving.   Ms. Pacifico , Thank you for taking time to come for your Medicare Wellness Visit. I appreciate your ongoing commitment to your health goals. Please review the following  plan we discussed and let me know if I can assist you in the future.    This is a list of the screening recommended for you and due dates:  Health Maintenance  Topic Date Due  . Mammogram  08/25/2012  . DEXA scan (bone density measurement)  12/16/2012  . Colon Cancer Screening  02/03/2021  . Tetanus Vaccine  03/17/2023  . Flu Shot  Completed  .  Hepatitis C: One time screening is recommended by Center for Disease Control  (CDC) for  adults born from [birth year] through 1963.   Completed  . Pneumonia vaccines  Completed   Mammogram (3D is recommended) and bone density tests are past due.  Please call [facility] and schedule these tests.  They can fax [place] an order for the bone density test to sign and return to them. Schedule them for the same day to get them both taken care of, since they are past due.  I recommend getting the new shingles vaccine (Shingrix). This is covered by Medicare Part D, which I understand you don't currently have.  I think the least expensive place to get the series is from [facility]--you can call and ask them about the pricing. It is a series of 2 injections, spaced 2 months apart.  Okay to stick to the intermittent fasting--be sure to stick with it reliably in order to prevent weight gain.  Please schedule a routine eye exam--these should be  done yearly at this age.  I sent a prescription for oxybutynin to your pharmacy to treat overactive bladder.  Since your leakage is worse at night, try taking it in the evening. You can take it up to 3 times daily.  Cut back on frequency or dose if having problems with dry mouth, constipation or other side effects.

## 2018-05-25 NOTE — Progress Notes (Signed)
Chief Complaint  Patient presents with  . Medicare Wellness    fasting (had 3 sucker candies) AWV with pelvic. Patient has no concerns.      Katie Kaiser is a 73 y.o. female who presents for annual wellness visit and follow-up on chronic medical conditions.   She was recently seen for worsening depression.  Abilify 52m was added to her regimen of fluoxetine 432mand wellbutrin 30049m PHQ-9 score was 10 at that time.  She was also given names of psychiatrists to consult with if not improving, to have medication regimen adjusted.  She started the Abilify and feels like it is helping. Denies side effects (see PHQ-9)  Hypothyroidism:  Her dose of thyroid medication was decreased in November to 175m75mShe denies changes to energy/hair/skin/bowels/weight, just the mood changes as reported above.  Recent TSH was improved (was 0.158 on 10/24, checked by cardiologist). Compliant with taking her medication on an empty stomach, separate from other medications.  Lab Results  Component Value Date   TSH 0.681 05/12/2018   At her November visit she was complaining of leakage of urine, and frequency, getting up 2-3 times at night. Doesn't drink caffeine, limits fluids at night.  No dysuria or odor. Has leakage with urgency, as well as with cough/sneeze. She is on diuretic for hypertension, but takes in the morning.  Nighttime symptoms are bothersome to her, so we prescribed Detrol LA as a trial. She admits she never filled this prescription due to cost (doesn't have part D). Willing to try a lower-cost medication.  She no longer complaints of any unrefreshed sleep.  She wears breathe-right strips for snoring, no known apnea.  Hypertension: She reports compliance with her medications, denies side effects other than urinary frequency which is exacerbated by her diuretic.  Her cardiologist increased her lisinopril dose to 20mg38mher last visit there in November.   BP's are running 120's-140/69-80. She  denies headaches, dizziness, syncope, chest pain, palpitations, DOE.  Aortic stenosis--mild per echo 02/2017; echo was repeated 03/2018 (after syncope), no change. Asymptomatic from this, no further dizziness/syncope; SBE is not recommended.  H/o impaired fasting glucose. Last A1c was a year ago, fasting sugar had been normal. Lab Results  Component Value Date   HGBA1C 5.8 (H) 02/12/2017   She no longer eats after 6-7pm.  (Currently doing intermittent fasting), eating window is 6-7 hours; doesn't eat before noon.  She has only been doing this for a week.  Immunization History  Administered Date(s) Administered  . Influenza Split 02/01/2012, 01/03/2013, 03/03/2014, 03/03/2014  . Influenza Whole 03/16/2011  . Influenza, High Dose Seasonal PF 03/14/2015, 03/19/2016, 02/12/2017, 03/17/2018  . Pneumococcal Conjugate-13 06/08/2013  . Pneumococcal Polysaccharide-23 03/03/2014  . Td 10/09/1997  . Tdap 08/02/2007, 03/16/2013  . Zoster 03/17/2011   Last Pap smear: 02/2017 normal, no high risk HPV present Last mammogram: 08/2010 (recommended yearly, she hasn't done) Last colonoscopy: 02/2011 Dr. GessnCarlean Purlerticulosis Last DEXA: had lifeline screening a few years ago. Never had full DEXA. It has been recommended since 06/2013,but she has yet to schedule this. Dentist: twice yearly Ophtho: last was 3 years ago Exercise:  recently started back using her recumbent bike, 20 minutes 4x/week. (stopped after she had fainting spell; restarted last week).   Lipids: Lab Results  Component Value Date   CHOL 173 10/18/2015   HDL 56 10/18/2015   LDLCALC 105 10/18/2015   TRIG 58 10/18/2015   CHOLHDL 3.1 10/18/2015   Vitamin D screen normal at 50  in 10/2015   Other doctors caring for patient include: Op[facility]ho:[facility]. Nicki [facility]aper at [facility] Dentist: [facility] Dermatologist: Dr. Allyson Sabal (retired) GI: Dr. Carlean Purl Ortho: [facility] (sees Dr. Rudene Anda PA, Biagio Borg)   Cardiologist: Dr. Eulas Post, Dr. Irish Lack  Depression screen: PHQ-2 score of 2, PHQ-9 score of 2 (improved from 1/13 where score was 4, and PHQ-9 score was 10). Fall screen: None in the last year Function Status screen: notable only for leakage of urine Mini-Cog screen: Normal See full screens in Epic.  End of Life Discussion: She signed something with Merrily Pew, thinks it was notarized; took new forms, will double check.   Past Medical History:  Diagnosis Date  . Arthritis   . Depression   . Diverticulosis   . Essential hypertension   . GERD (gastroesophageal reflux disease)   . Heart murmur    slight murmur per pt  . Hypothyroid   . Impaired fasting glucose 5/07  . Kidney stone   . OAB (overactive bladder)   . Obesity   . Pneumonia   . Vitamin D deficiency     Past Surgical History:  Procedure Laterality Date  . APPENDECTOMY    . CESAREAN SECTION    . COLONOSCOPY  02/04/2011   diverticulosis  . ORIF FOREARM FRACTURE  2002  . THYROIDECTOMY  1975  . TONSILLECTOMY    . TOTAL KNEE ARTHROPLASTY Right 11/27/2015   Procedure: TOTAL KNEE ARTHROPLASTY;  Surgeon: Garald Balding, MD;  Location: [facility];  Service: Orthopedics;  Laterality: Right;    Social History   Socioeconomic History  . Marital status: Single    Spouse name: Not on file  . Number of children: 1  . Years of education: Not on file  . Highest education level: Not on file  Occupational History  . Occupation: hairdresser    Employer: [employer]  Social Needs  . Financial resource strain: Not on file  . Food insecurity:    Worry: Not on file    Inability: Not on file  . Transportation needs:    Medical: Not on file    Non-medical: Not on file  Tobacco Use  . Smoking status: Former Smoker    Last attempt to quit: 05/05/1978    Years since quitting: 40.0  . Smokeless tobacco: Never Used  Substance and Sexual Activity  . Alcohol use: Yes    Alcohol/week: 0.0 standard drinks    Comment: a glass of wine  or beer once or twice a month or less  . Drug use: No  . Sexual activity: Not Currently  Lifestyle  . Physical activity:    Days per week: Not on file    Minutes per session: Not on file  . Stress: Not on file  Relationships  . Social connections:    Talks on phone: Not on file    Gets together: Not on file    Attends religious service: Not on file    Active member of club or organization: Not on file    Attends meetings of clubs or organizations: Not on file    Relationship status: Not on file  . Intimate partner violence:    Fear of current or ex partner: Not on file    Emotionally abused: Not on file    Physically abused: Not on file    Forced sexual activity: Not on file  Other Topics Concern  . Not on file  Social History Narrative   Lives alone; has a friend subletting her  downstairs. 3 dogs. Son lives in [place], [place] granddaughters, 4th due 05/2018   Hair dresser    Family History  Problem Relation Age of Onset  . Parkinsonism Mother   . Diabetes Brother   . Heart disease Brother        dx'd in 34's  . HIV Brother   . Arthritis Sister        rheumatoid  . Arthritis Sister        rheumatoid  . Bipolar disorder Sister   . Cancer Neg Hx   . Colon cancer Neg Hx   . Stomach cancer Neg Hx     Outpatient Encounter Medications as of 05/26/2018  Medication Sig Note  . ARIPiprazole (ABILIFY) 2 MG tablet Take 1 tablet (2 mg total) by mouth daily. 05/26/2018: Takes at bedtime  . aspirin EC 81 MG tablet Take 81 mg by mouth daily.   Marland Kitchen buPROPion (WELLBUTRIN XL) 300 MG 24 hr tablet TAKE 1 TABLET BY MOUTH DAILY   . Cholecalciferol (VITAMIN D) 1000 UNITS capsule Take 1,000 Units by mouth daily.     Marland Kitchen esomeprazole (NEXIUM) 20 MG capsule Take 20 mg by mouth daily at 12 noon.   Marland Kitchen FLUoxetine (PROZAC) 40 MG capsule Take 1 capsule (40 mg total) by mouth daily.   . hydrochlorothiazide (HYDRODIURIL) 25 MG tablet TAKE 1 TABLET BY MOUTH ONCE DAILY   . lisinopril (PRINIVIL,ZESTRIL) 20 MG  tablet Take 1 tablet (20 mg total) by mouth every evening.   . Omega-3 Fatty Acids (FISH OIL) 1000 MG CAPS Take 2 mg daily by mouth.   . SYNTHROID 175 MCG tablet Take 1 tablet (175 mcg total) by mouth daily before breakfast.   . oxybutynin (DITROPAN) 5 MG tablet Take 1/2 to 1 tablet up to three times daily   . [DISCONTINUED] tolterodine (DETROL LA) 2 MG 24 hr capsule Take 1 capsule (2 mg total) by mouth daily. (Patient not taking: Reported on 05/17/2018) 05/26/2018: Never filled due to cost   No facility-administered encounter medications on file as of 05/26/2018.    (NOT taking detrol or ditropan prior to visit).  Allergies  Allergen Reactions  . Dilaudid [Hydromorphone Hcl]     Lots of itching     ROS: The patient denies anorexia, fever, headaches, vision changes, decreased hearing, ear pain, sore throat, breast concerns, chest pain, palpitations, dizziness, any further syncope, dyspnea on exertion, cough, swelling, nausea, vomiting, diarrhea, constipation, melena, hematochezia, indigestion/heartburn, hematuria, dysuria, vaginal bleeding, discharge, odor or itch, genital lesions, numbness, tingling, weakness, tremor, suspicious skin lesions, abnormal bleeding/bruising, or enlarged lymph nodes. Depression per HPI, improved since starting abilify. +urinary incontinence per HPI Tremor in left arm/hand since injury/surgery, unchanged Some neck pain (only while watching TV on couch, with head slightly turned to the left).    PHYSICAL EXAM:  BP 134/78   Pulse 84   Ht 5' 3.75" (1.619 m)   Wt 239 lb (108.4 kg)   LMP 05/05/1990   BMI 41.35 kg/m    Wt Readings from Last 3 Encounters:  05/17/18 240 lb 12.8 oz (109.2 kg)  04/07/18 241 lb (109.3 kg)  03/18/18 237 lb 6.4 oz (107.7 kg)   General Appearance:   Alert, cooperative, no distress, appears stated age  Head:   Normocephalic, without obvious abnormality, atraumatic  Eyes:   PERRL, conjunctiva/corneas clear, EOM's  intact, fundi benign  Ears:   Normal TM's and external ear canals.  Nose:  Nares normal, mucosa is dry/crusty, with some recent bleeding noted  on the left, no drainage or sinus tenderness  Throat:  Lips, mucosa, and tongue normal; teeth and gums normal  Neck:  Supple, no lymphadenopathy; thyroid: no enlargement/tenderness/nodules; no carotid bruit or JVD  Back:  Spine nontender, no curvature, ROM normal, no CVAtenderness  Lungs:   Clear to auscultation bilaterally without wheezes, rales or ronchi; respirations unlabored  Chest Wall:   No tenderness or deformity  Heart:   Regular rate and rhythm, S1 and S2 normal, no rub or gallop. 3/6 SEM heard loudest at RUSB  Breast Exam:   No tenderness, masses, or nipple discharge or inversion. No axillary lymphadenopathy  Abdomen:   Soft, non-tender, nondistended, normoactive bowel sounds, no masses, no hepatosplenomegaly  Genitalia:   Normal external genitalia without lesions; atrophic changes noted. BUS and vagina normal; no cervical motion tenderness. No abnormal vaginal discharge. Uterus and adnexa not enlarged, nontender, no masses.Pap not performed  Rectal:   Normal tone, no masses or tenderness; guaiac negative stool  Extremities:  No clubbing, cyanosis. Wearing compression socks, and trace pretibial edema noted through socks. Varicose veins noted left medial thigh, nontender  Pulses:  2+ and symmetric all extremities  Skin:  Skin color, texture, turgor normal, no rashes or lesions  Lymph nodes:  Cervical, supraclavicular, and axillary nodes normal  Neurologic:  CNII-XII intact, normal strength, sensation and gait; reflexes 2+ and symmetric throughout   Psych: Normal mood, affect, hygiene and grooming  PHQ-9 score of 2 (down from 10 on the 1/13)  Lab Results  Component Value Date   HGBA1C 5.9 (A) 05/26/2018   Sugar  100   ASSESSMENT/PLAN:   Medicare annual wellness visit, subsequent  Moderate episode of recurrent major depressive disorder ([place]) - significant improvement noted since Abilify added earlier this month; cont. Reviewed potential SE to look for  Essential hypertension - controlled; CPM; encouraged low Na diet, weight loss, daily exercise  Hypothyroidism, unspecified type - cont current dose--plan to recheck TSH in 3-4 months  Mild aortic stenosis - stable, asymptomatic  Urinary incontinence in female  Impaired fasting glucose - intermittent fasting should help; daily exercise, weight loss, lowfat, low carb diet - Plan: HgB A1c, Glucose (CBG), Fasting  Overactive bladder - trial of short-acting oxybutynin (most likely the least expensive); risks/SE reviewed - Plan: oxybutynin (DITROPAN) 5 MG tablet  Class 3 severe obesity due to excess calories with serious comorbidity and body mass index (BMI) of 40.0 to 44.9 in adult [facility]) - counseled re: obesity risks; healthy diet, portions, exercise, wt loss   A1c and fasting glucose today, as above. Had c-met and CBC in October by cardiologist (nonfasting, normal other than glucose, chronic anemia, stable Hg) TSH earlier this month.   mammo--past due (last got at [facility]); DEXA--past due Counseled extensively and encouraged her to schedule for the same day.  Discussed monthly self breast exams and yearly mammograms (she is past due and reminded to schedule); at least 30 minutes of aerobic activity at least 5 days/week, weight-bearing exercise at least 2x/week; proper sunscreen use reviewed; healthy diet, including goals of calcium and vitamin D intake and alcohol recommendations (less than or equal to 1 drink/day) reviewed; regular seatbelt use; changing batteries in smoke detectors. Immunization recommendations discussed--continue yearly high dose flu shots. Shingrix recommended. Colonoscopy recommendations reviewed, UTD.  DEXA recommended    Okay to stick to the intermittent fasting--be sure to stick with it reliably in order to prevent weight gain.  Please schedule a routine eye exam--these should be done yearly at this  age.  I sent a prescription for oxybutynin to your pharmacy to treat overactive bladder.  Since your leakage is worse at night, try taking it in the evening. You can take it up to 3 times daily.  Cut back on frequency or dose if having problems with dry mouth, constipation or other side effects.  F/u 3 months--TSH and A1c  Medicare Attestation I have personally reviewed: The patient's medical and social history Their use of alcohol, tobacco or illicit drugs Their current medications and supplements The patient's functional ability including ADLs,fall risks, home safety risks, cognitive, and hearing and visual impairment Diet and physical activities Evidence for depression or mood disorders  The patient's weight, height and BMI have been recorded in the chart.  I have made referrals, counseling, and provided education to the patient based on review of the above and I have provided the patient with a written personalized care plan for preventive services.

## 2018-05-26 ENCOUNTER — Encounter: Payer: Self-pay | Admitting: Family Medicine

## 2018-05-26 ENCOUNTER — Ambulatory Visit (INDEPENDENT_AMBULATORY_CARE_PROVIDER_SITE_OTHER): Payer: Medicare Other | Admitting: Family Medicine

## 2018-05-26 VITALS — BP 134/78 | HR 84 | Ht 63.75 in | Wt 239.0 lb

## 2018-05-26 DIAGNOSIS — R32 Unspecified urinary incontinence: Secondary | ICD-10-CM

## 2018-05-26 DIAGNOSIS — Z0000 Encounter for general adult medical examination without abnormal findings: Secondary | ICD-10-CM

## 2018-05-26 DIAGNOSIS — E039 Hypothyroidism, unspecified: Secondary | ICD-10-CM

## 2018-05-26 DIAGNOSIS — N3281 Overactive bladder: Secondary | ICD-10-CM | POA: Diagnosis not present

## 2018-05-26 DIAGNOSIS — F331 Major depressive disorder, recurrent, moderate: Secondary | ICD-10-CM | POA: Diagnosis not present

## 2018-05-26 DIAGNOSIS — Z6841 Body Mass Index (BMI) 40.0 and over, adult: Secondary | ICD-10-CM | POA: Diagnosis not present

## 2018-05-26 DIAGNOSIS — R7301 Impaired fasting glucose: Secondary | ICD-10-CM | POA: Diagnosis not present

## 2018-05-26 DIAGNOSIS — I350 Nonrheumatic aortic (valve) stenosis: Secondary | ICD-10-CM

## 2018-05-26 DIAGNOSIS — I10 Essential (primary) hypertension: Secondary | ICD-10-CM

## 2018-05-26 LAB — POCT GLYCOSYLATED HEMOGLOBIN (HGB A1C): HEMOGLOBIN A1C: 5.9 % — AB (ref 4.0–5.6)

## 2018-05-26 LAB — POCT CBG (FASTING - GLUCOSE)-MANUAL ENTRY: GLUCOSE FASTING, POC: 100 mg/dL — AB (ref 70–99)

## 2018-05-26 MED ORDER — OXYBUTYNIN CHLORIDE 5 MG PO TABS: ORAL_TABLET | ORAL | 1 refills | Status: DC

## 2018-06-04 ENCOUNTER — Encounter: Payer: Self-pay | Admitting: Family Medicine

## 2018-06-09 DIAGNOSIS — Z1231 Encounter for screening mammogram for malignant neoplasm of breast: Secondary | ICD-10-CM | POA: Diagnosis not present

## 2018-06-09 DIAGNOSIS — M85851 Other specified disorders of bone density and structure, right thigh: Secondary | ICD-10-CM | POA: Diagnosis not present

## 2018-06-09 DIAGNOSIS — Z96651 Presence of right artificial knee joint: Secondary | ICD-10-CM | POA: Diagnosis not present

## 2018-06-09 LAB — HM DEXA SCAN

## 2018-06-09 LAB — HM MAMMOGRAPHY

## 2018-06-10 ENCOUNTER — Encounter: Payer: Self-pay | Admitting: *Deleted

## 2018-06-15 ENCOUNTER — Other Ambulatory Visit: Payer: Self-pay | Admitting: Family Medicine

## 2018-06-15 DIAGNOSIS — I10 Essential (primary) hypertension: Secondary | ICD-10-CM

## 2018-06-17 ENCOUNTER — Encounter: Payer: Self-pay | Admitting: Interventional Cardiology

## 2018-06-23 ENCOUNTER — Telehealth: Payer: Self-pay

## 2018-06-23 NOTE — Telephone Encounter (Signed)
Patient stated she recently was prescribed Abilify and it has caused her to have a tremor in left leg and foot and she's easily aggitated and feels more depressed. She stated she is not taking that anymore and she wants that medication out of her system before she takes something else. Any suggestions or recommendations?

## 2018-06-23 NOTE — Telephone Encounter (Signed)
She needs to see psychiatrist. We had discussed this was one option we would try to help with her moods (which did help when we last saw her)--I agree with stopping the medication if noting abnormal movements.  She should check her insurance to see which psychiatrists are covered.

## 2018-06-23 NOTE — Telephone Encounter (Signed)
Left detailed message for patient on her voicemail, asked her to please call me if she had any questions.

## 2018-07-05 ENCOUNTER — Ambulatory Visit: Payer: Medicare Other | Admitting: Interventional Cardiology

## 2018-07-07 ENCOUNTER — Ambulatory Visit (INDEPENDENT_AMBULATORY_CARE_PROVIDER_SITE_OTHER): Payer: Medicare Other | Admitting: Interventional Cardiology

## 2018-07-07 ENCOUNTER — Encounter: Payer: Self-pay | Admitting: Interventional Cardiology

## 2018-07-07 VITALS — BP 122/76 | HR 85 | Ht 63.75 in | Wt 244.0 lb

## 2018-07-07 DIAGNOSIS — I350 Nonrheumatic aortic (valve) stenosis: Secondary | ICD-10-CM | POA: Diagnosis not present

## 2018-07-07 DIAGNOSIS — I10 Essential (primary) hypertension: Secondary | ICD-10-CM

## 2018-07-07 NOTE — Patient Instructions (Signed)

## 2018-07-07 NOTE — Progress Notes (Signed)
Cardiology Office Note   Date:  07/07/2018   ID:  Katie Kaiser, DOB [birth year], MRN [id number]  PCP:  Rita Ohara, MD    No chief complaint on file.  Aortic stenosis  Wt Readings from Last 3 Encounters:  05/26/18 239 lb (108.4 kg)  05/17/18 240 lb 12.8 oz (109.2 kg)  04/07/18 241 lb (109.3 kg)       History of Present Illness: Katie Kaiser is a 73 y.o. female  With a h/o mild aortic stenosis.    In late 2019, she has some dizziness, but this improved with better hydration.  She never fell.    Denies : Chest pain. Dizziness. Leg edema. Nitroglycerin use. Orthopnea. Palpitations. Paroxysmal nocturnal dyspnea. Shortness of breath. Syncope.   She rides an exercise bike 3x/week.  No cardiac sx with that activity.    BP has been well controlled.  Lisinopril has helped.  Past Medical History:  Diagnosis Date  . Arthritis   . Depression   . Diverticulosis   . Essential hypertension   . GERD (gastroesophageal reflux disease)   . Heart murmur    slight murmur per pt  . Hypothyroid   . Impaired fasting glucose 5/07  . Kidney stone   . OAB (overactive bladder)   . Obesity   . Pneumonia   . Vitamin D deficiency     Past Surgical History:  Procedure Laterality Date  . APPENDECTOMY    . CESAREAN SECTION    . COLONOSCOPY  02/04/2011   diverticulosis  . ORIF FOREARM FRACTURE  2002  . THYROIDECTOMY  1975  . TONSILLECTOMY    . TOTAL KNEE ARTHROPLASTY Right 11/27/2015   Procedure: TOTAL KNEE ARTHROPLASTY;  Surgeon: Garald Balding, MD;  Location: [facility];  Service: Orthopedics;  Laterality: Right;     Current Outpatient Medications  Medication Sig Dispense Refill  . ARIPiprazole (ABILIFY) 2 MG tablet Take 1 tablet (2 mg total) by mouth daily. 30 tablet 1  . aspirin EC 81 MG tablet Take 81 mg by mouth daily.    Marland Kitchen buPROPion (WELLBUTRIN XL) 300 MG 24 hr tablet TAKE 1 TABLET BY MOUTH DAILY 90 tablet 0  . Cholecalciferol (VITAMIN D) 1000 UNITS capsule Take 1,000  Units by mouth daily.      Marland Kitchen esomeprazole (NEXIUM) 20 MG capsule Take 20 mg by mouth daily at 12 noon.    Marland Kitchen FLUoxetine (PROZAC) 40 MG capsule Take 1 capsule (40 mg total) by mouth daily. 90 capsule 0  . hydrochlorothiazide (HYDRODIURIL) 25 MG tablet TAKE 1 TABLET BY MOUTH ONCE DAILY 90 tablet 1  . lisinopril (PRINIVIL,ZESTRIL) 20 MG tablet Take 1 tablet (20 mg total) by mouth every evening. 30 tablet 6  . Omega-3 Fatty Acids (FISH OIL) 1000 MG CAPS Take 2 mg daily by mouth.    . oxybutynin (DITROPAN) 5 MG tablet Take 1/2 to 1 tablet up to three times daily 30 tablet 1  . SYNTHROID 175 MCG tablet Take 1 tablet (175 mcg total) by mouth daily before breakfast. 30 tablet 5   No current facility-administered medications for this visit.     Allergies:   Dilaudid [hydromorphone hcl]    Social History:  The patient  reports that she quit smoking about 40 years ago. She has never used smokeless tobacco. She reports current alcohol use. She reports that she does not use drugs.   Family History:  The patient's family history includes Arthritis in her sister and sister; Bipolar disorder  in her sister; Diabetes in her brother; HIV in her brother; Heart disease in her brother; Parkinsonism in her mother.    ROS:  Please see the history of present illness.   Otherwise, review of systems are positive for dizzy episode.   All other systems are reviewed and negative.    PHYSICAL EXAM: VS:  LMP 05/05/1990  , BMI There is no height or weight on file to calculate BMI. GEN: Well nourished, well developed, in no acute distress  HEENT: normal  Neck: no JVD, carotid bruits, or masses Cardiac: RRR; 2/6 systolic murmur, no rubs, or gallops,no edema  Respiratory:  clear to auscultation bilaterally, normal work of breathing GI: soft, nontender, nondistended, + BS MS: no deformity or atrophy  Skin: warm and dry, no rash Neuro:  Strength and sensation are intact, left hand tremor Psych: euthymic mood, full  affect    Recent Labs: 02/25/2018: ALT 13; BUN 14; Creatinine, Ser 0.67; Hemoglobin 10.9; Platelets 239; Potassium 4.4; Sodium 141 05/12/2018: TSH 0.681   Lipid Panel    Component Value Date/Time   CHOL 173 10/18/2015 0909   TRIG 58 10/18/2015 0909   HDL 56 10/18/2015 0909   CHOLHDL 3.1 10/18/2015 0909   VLDL 12 10/18/2015 0909   LDLCALC 105 10/18/2015 0909     Other studies Reviewed: Additional studies/ records that were reviewed today with results demonstrating: 11/19 echo reviewed.   ASSESSMENT AND PLAN:  1. Aortic stenosis: Mild by most recent echo.  Clear S2 on exam.  Continue preventive therapy.  2. Hypertension: Blood pressures well controlled.  Continue lisinopril. 3. Dizziness: Resolved.   Current medicines are reviewed at length with the patient today.  The patient concerns regarding her medicines were addressed.  The following changes have been made:  No change  Labs/ tests ordered today include:  No orders of the defined types were placed in this encounter.   Recommend 150 minutes/week of aerobic exercise Low fat, low carb, high fiber diet recommended  Disposition:   FU in 1 year   Signed, Larae Grooms, MD  07/07/2018 12:40 PM    [facility] [address], [place], [place]  [address] Phone: [phone]; Fax: [phone]

## 2018-07-13 ENCOUNTER — Other Ambulatory Visit: Payer: Self-pay | Admitting: Family Medicine

## 2018-07-13 DIAGNOSIS — F325 Major depressive disorder, single episode, in full remission: Secondary | ICD-10-CM

## 2018-07-13 NOTE — Telephone Encounter (Signed)
[facility] is requesting to fill pt wellbutrin. Due to Dr. Tomi Bamberger being out of the office please advise [facility]

## 2018-07-19 ENCOUNTER — Telehealth: Payer: Self-pay

## 2018-07-19 DIAGNOSIS — F331 Major depressive disorder, recurrent, moderate: Secondary | ICD-10-CM

## 2018-07-19 MED ORDER — FLUOXETINE HCL 40 MG PO CAPS: 40.0000 mg | ORAL_CAPSULE | Freq: Every day | ORAL | 0 refills | Status: DC

## 2018-07-19 NOTE — Telephone Encounter (Signed)
Patient advised and she does have an appt with psych in [place].

## 2018-07-19 NOTE — Telephone Encounter (Signed)
Patient called wanting refill of Prozac to [name] on [address]

## 2018-07-19 NOTE — Telephone Encounter (Signed)
Okay to refill (has appt scheduled for 4/22, so okay for #90), but it was recommended that she see psychiatrist when we had to stop the Abilify. See if pt has scheduled with psych, or if she needs any info/names to help her get scheduled

## 2018-08-14 ENCOUNTER — Other Ambulatory Visit: Payer: Self-pay | Admitting: Family Medicine

## 2018-08-14 MED ORDER — CARISOPRODOL 350 MG PO TABS: 350.0000 mg | ORAL_TABLET | Freq: Four times a day (QID) | ORAL | 0 refills | Status: DC | PRN

## 2018-08-16 ENCOUNTER — Telehealth: Payer: Self-pay | Admitting: *Deleted

## 2018-08-16 NOTE — Telephone Encounter (Signed)
Called to check on patient and she said that Dr.lalonde is her hero. She used to think he was a good doctor but now she thinks he is a great doctor! She said he was very kind to her on Saturday. She is doing so much better. Taking 1 soma in the am and 1 at night, she can walk again only just a little but sore.

## 2018-08-19 ENCOUNTER — Telehealth: Payer: Self-pay | Admitting: Family Medicine

## 2018-08-19 NOTE — Telephone Encounter (Signed)
Done pt is coming in tomorrow. [place]

## 2018-08-19 NOTE — Telephone Encounter (Signed)
Schedule her to come in for a visit with me.  Let her know that even is fairly full and it will be easier to get in with me either today or tomorrow morning

## 2018-08-19 NOTE — Telephone Encounter (Signed)
Pt called and states that she called after hours and spoke to [employer]. He gave her SOMA. She states that for a couple of days medication was working great but not its not helping. Sending back to [place] and [place]. Pt can be reached at [phone] and uses [facility].

## 2018-08-20 ENCOUNTER — Other Ambulatory Visit: Payer: Self-pay

## 2018-08-20 ENCOUNTER — Ambulatory Visit: Payer: Medicare Other | Admitting: Family Medicine

## 2018-08-20 ENCOUNTER — Ambulatory Visit (INDEPENDENT_AMBULATORY_CARE_PROVIDER_SITE_OTHER): Payer: Medicare Other | Admitting: Family Medicine

## 2018-08-20 ENCOUNTER — Encounter: Payer: Self-pay | Admitting: Family Medicine

## 2018-08-20 VITALS — BP 126/76 | HR 85 | Temp 98.1°F | Wt 243.6 lb

## 2018-08-20 DIAGNOSIS — M79652 Pain in left thigh: Secondary | ICD-10-CM

## 2018-08-20 MED ORDER — CARISOPRODOL 350 MG PO TABS: 350.0000 mg | ORAL_TABLET | Freq: Four times a day (QID) | ORAL | 0 refills | Status: DC | PRN

## 2018-08-20 NOTE — Progress Notes (Signed)
   Subjective:    Patient ID: Katie Kaiser, female    DOB: Dec 31, 1945, 73 y.o.   MRN: [id number]  HPI She states that approximately 2-1/2 weeks ago she had the sudden onset of a cramp in her left thigh and since then has continued to have pain in that area.  No history of injury to that area, hip or knee trouble, cramping in any other muscles.  She was given Soma Compound which did help.  This unfortunately wore off and she was having to useSoma regularly.  She states that she is roughly 30% better.   Review of Systems     Objective:   Physical Exam Alert and in no distress.  Full motion of the hip without pain.  Tender to palpation in the mid thigh area with pain on flexion of that muscle.  No lesions were palpable.  Full motion of the knee.       Assessment & Plan:  Pain of left thigh - Plan: CBC with Differential/Platelet, Comprehensive metabolic panel, carisoprodol (SOMA) 350 MG tablet I will give her more Soma and she is also to use 2 Aleve twice per day.  If she does not improve, I think an ultrasound of that area would be appropriate.

## 2018-08-21 LAB — COMPREHENSIVE METABOLIC PANEL
ALT: 24 IU/L (ref 0–32)
AST: 25 IU/L (ref 0–40)
Albumin/Globulin Ratio: 1.4 (ref 1.2–2.2)
Albumin: 4.3 g/dL (ref 3.8–4.8)
Alkaline Phosphatase: 171 IU/L — ABNORMAL HIGH (ref 39–117)
BUN/Creatinine Ratio: 14 (ref 12–28)
BUN: 11 mg/dL (ref 8–27)
Bilirubin Total: 0.4 mg/dL (ref 0.0–1.2)
CO2: 23 mmol/L (ref 20–29)
Calcium: 9.6 mg/dL (ref 8.7–10.3)
Chloride: 98 mmol/L (ref 96–106)
Creatinine, Ser: 0.8 mg/dL (ref 0.57–1.00)
GFR calc Af Amer: 86 mL/min/{1.73_m2} (ref >59)
GFR calc non Af Amer: 75 mL/min/{1.73_m2} (ref >59)
Globulin, Total: 3 g/dL (ref 1.5–4.5)
Glucose: 91 mg/dL (ref 65–99)
Potassium: 5.2 mmol/L (ref 3.5–5.2)
Sodium: 137 mmol/L (ref 134–144)
Total Protein: 7.3 g/dL (ref 6.0–8.5)

## 2018-08-21 LAB — CBC WITH DIFFERENTIAL/PLATELET
Basophils Absolute: 0.1 10*3/uL (ref 0.0–0.2)
Basos: 1 %
EOS (ABSOLUTE): 0.2 10*3/uL (ref 0.0–0.4)
Eos: 2 %
Hematocrit: 35.2 % (ref 34.0–46.6)
Hemoglobin: 11.5 g/dL (ref 11.1–15.9)
Immature Grans (Abs): 0 10*3/uL (ref 0.0–0.1)
Immature Granulocytes: 1 %
Lymphocytes Absolute: 1.4 10*3/uL (ref 0.7–3.1)
Lymphs: 18 %
MCH: 25.5 pg — ABNORMAL LOW (ref 26.6–33.0)
MCHC: 32.7 g/dL (ref 31.5–35.7)
MCV: 78 fL — ABNORMAL LOW (ref 79–97)
Monocytes Absolute: 0.7 10*3/uL (ref 0.1–0.9)
Monocytes: 9 %
Neutrophils Absolute: 5.3 10*3/uL (ref 1.4–7.0)
Neutrophils: 69 %
Platelets: 286 10*3/uL (ref 150–450)
RBC: 4.51 x10E6/uL (ref 3.77–5.28)
RDW: 14.5 % (ref 11.7–15.4)
WBC: 7.6 10*3/uL (ref 3.4–10.8)

## 2018-08-25 ENCOUNTER — Encounter: Payer: Medicare Other | Admitting: Family Medicine

## 2018-08-28 ENCOUNTER — Other Ambulatory Visit: Payer: Self-pay | Admitting: Family Medicine

## 2018-08-28 DIAGNOSIS — F325 Major depressive disorder, single episode, in full remission: Secondary | ICD-10-CM

## 2018-09-13 ENCOUNTER — Telehealth: Payer: Self-pay | Admitting: Family Medicine

## 2018-09-13 ENCOUNTER — Other Ambulatory Visit: Payer: Self-pay

## 2018-09-13 DIAGNOSIS — M79652 Pain in left thigh: Secondary | ICD-10-CM

## 2018-09-13 LAB — ALKALINE PHOSPHATASE, ISOENZYMES
Alkaline Phosphatase: 172 IU/L — ABNORMAL HIGH (ref 39–117)
BONE FRACTION: 21 % (ref 14–68)
INTESTINAL FRAC.: 5 % (ref 0–18)
LIVER FRACTION: 74 % (ref 18–85)

## 2018-09-13 LAB — SPECIMEN STATUS REPORT

## 2018-09-13 NOTE — Telephone Encounter (Signed)
Pt called and states that her leg has not gotten any better states it has moved down to where she bends her leg, states it hurts when she stands or when she is laying down at night, she is still taking the aleve that you told her to take, states you informed her that if it did not get better you would send her for a [place] or some kind of test, pt can be reached at [phone]

## 2018-09-13 NOTE — Telephone Encounter (Signed)
See if you can get her in with either Charlann Boxer or [facility]

## 2018-09-13 NOTE — Telephone Encounter (Signed)
Pt has an appointment already. [place]

## 2018-09-15 ENCOUNTER — Telehealth: Payer: Self-pay | Admitting: Family Medicine

## 2018-09-15 ENCOUNTER — Ambulatory Visit (INDEPENDENT_AMBULATORY_CARE_PROVIDER_SITE_OTHER): Payer: Medicare Other | Admitting: Family Medicine

## 2018-09-15 ENCOUNTER — Encounter: Payer: Self-pay | Admitting: Family Medicine

## 2018-09-15 ENCOUNTER — Ambulatory Visit: Payer: Self-pay

## 2018-09-15 ENCOUNTER — Other Ambulatory Visit: Payer: Self-pay

## 2018-09-15 DIAGNOSIS — M25552 Pain in left hip: Secondary | ICD-10-CM | POA: Diagnosis not present

## 2018-09-15 MED ORDER — PREDNISONE 10 MG PO TABS: ORAL_TABLET | ORAL | 0 refills | Status: DC

## 2018-09-15 NOTE — Patient Instructions (Signed)
   Magnesium:  400 mg daily  Glucosamine:  1,000 mg twice daily

## 2018-09-15 NOTE — Telephone Encounter (Signed)
Pt called and wanted to say thank you for sending her to the ortho dr states he really helped her, pt can be reached at [phone]

## 2018-09-15 NOTE — Progress Notes (Signed)
Office Visit Note   Patient: Katie Kaiser           Date of Birth: 1946-03-15           MRN: [id number] Visit Date: 09/15/2018 Requested by: Denita Lung, MD [address] [place], [place] [address] PCP: Rita Ohara, MD  Subjective: Chief Complaint  Patient presents with  . Left Hip - Pain    Pain in left groin x couple months. Started with cramping in upper thigh one night. NKI.    HPI: She is seen at the request of Dr. Redmond School for left groin pain.  Symptoms started about a month ago, she woke from sleep with a severe cramp in her left anterolateral thigh.  It took a long time to get the cramp to stop so that she can go to sleep.  Ever since then she has had pain in the groin area whenever she walks, transitions from sitting to standing, or gets in and out of the car.  She has not had any more cramping.  There is no change in her activities to account for her pain.  No change in her medications.  She has tried some muscle relaxers with some improvement in pain.  She has a history of right knee replacement.  No history of problems.  Denies any low back pain.  Denies any numbness or tingling in her leg.               ROS: As fevers, chills, respiratory symptoms.  All other systems were reviewed and are negative.  Objective: Vital Signs: LMP 05/05/1990   Physical Exam:  General:  Alert and oriented, in no acute distress. Pulm:  Breathing unlabored. Psy:  Normal mood, congruent affect. Skin: No visible rash. Left hip: No tenderness to palpation in the posterior hip or over the greater trochanter.  There is some tenderness to palpation of the proximal lateral quadricep muscle.  She has decreased range of motion and exquisite pain with passive hip flexion and internal rotation.  Good strength with hip flexion, abduction, adduction, knee extension, ankle dorsiflexion, eversion, plantarflexion.  Imaging: X-rays left hip: Mild joint space narrowing right greater than left, no  sign of stress fracture or neoplasm.  Musculoskeletal ultrasound: Limited diagnostic scan done of the anterior hip shows no joint effusion, no hyperemia on power Doppler imaging.  I do not see a tear in the tender quadricep muscle.    Assessment & Plan: 1.  Left hip pain, partially myofascial pain but seems to have some joint pain related to osteoarthritis as well. -We will try a prednisone taper followed by glucosamine. -Magnesium to take for cramp prevention and for help with blood pressure regulation. -Could do an intra-articular hip injection if symptoms do not improve.     Procedures: No procedures performed  No notes on file     PMFS History: Patient Active Problem List   Diagnosis Date Noted  . Pneumonia   . Obesity   . OAB (overactive bladder)   . Kidney stone   . Hypothyroid   . Heart murmur   . GERD (gastroesophageal reflux disease)   . Depression   . Diverticulosis   . Arthritis   . Aortic stenosis, mild 02/18/2017  . Unilateral primary osteoarthritis, left knee 04/02/2016  . Primary osteoarthritis of right knee 11/27/2015  . S/P total knee replacement using cement 11/27/2015  . Overactive bladder 11/15/2015  . Essential hypertension 04/17/2015  . Atherosclerosis of both carotid arteries 03/14/2015  .  Advance care planning 08/21/2014  . Vitamin D deficiency 06/08/2013  . Depression, major, in remission ([place]) 06/08/2013  . Obesity, Class III, BMI 40-49.9 (morbid obesity) ([place]) 02/04/2011  . Hypothyroidism 10/21/2010  . Impaired fasting glucose 09/02/2005   Past Medical History:  Diagnosis Date  . Arthritis   . Depression   . Diverticulosis   . Essential hypertension   . GERD (gastroesophageal reflux disease)   . Heart murmur    slight murmur per pt  . Hypothyroid   . Impaired fasting glucose 5/07  . Kidney stone   . OAB (overactive bladder)   . Obesity   . Pneumonia   . Vitamin D deficiency     Family History  Problem Relation Age of Onset   . Parkinsonism Mother   . Diabetes Brother   . Heart disease Brother        dx'd in 24's  . HIV Brother   . Arthritis Sister        rheumatoid  . Arthritis Sister        rheumatoid  . Bipolar disorder Sister   . Cancer Neg Hx   . Colon cancer Neg Hx   . Stomach cancer Neg Hx     Past Surgical History:  Procedure Laterality Date  . APPENDECTOMY    . CESAREAN SECTION    . COLONOSCOPY  02/04/2011   diverticulosis  . ORIF FOREARM FRACTURE  2002  . THYROIDECTOMY  1975  . TONSILLECTOMY    . TOTAL KNEE ARTHROPLASTY Right 11/27/2015   Procedure: TOTAL KNEE ARTHROPLASTY;  Surgeon: Garald Balding, MD;  Location: [facility];  Service: Orthopedics;  Laterality: Right;   Social History   Occupational History  . Occupation: hairdresser    Fish farm manager: [employer]  Tobacco Use  . Smoking status: Former Smoker    Last attempt to quit: 05/05/1978    Years since quitting: 40.3  . Smokeless tobacco: Never Used  Substance and Sexual Activity  . Alcohol use: Yes    Alcohol/week: 0.0 standard drinks    Comment: a glass of wine or beer once or twice a month or less  . Drug use: No  . Sexual activity: Not Currently

## 2018-09-17 ENCOUNTER — Encounter: Payer: Self-pay | Admitting: Family Medicine

## 2018-09-17 ENCOUNTER — Telehealth: Payer: Self-pay | Admitting: Family Medicine

## 2018-09-17 NOTE — Telephone Encounter (Signed)
Patient called to thank Dr Junius Roads for all he did to help her. Patient said Dr Junius Roads is her Hero. Patient said Dr Junius Roads really cared and wanted to help her. The number to contact patient is [phone]

## 2018-10-07 ENCOUNTER — Other Ambulatory Visit: Payer: Self-pay | Admitting: Family Medicine

## 2018-10-07 DIAGNOSIS — F331 Major depressive disorder, recurrent, moderate: Secondary | ICD-10-CM

## 2018-10-07 NOTE — Telephone Encounter (Signed)
Can you please refill this one more time as her appt with psych in [place] [place] got canceled and she cannot do a virtual visit since she is new patient-they will call her to reschedule.

## 2018-11-10 ENCOUNTER — Other Ambulatory Visit: Payer: Self-pay | Admitting: Family Medicine

## 2018-11-10 DIAGNOSIS — F325 Major depressive disorder, single episode, in full remission: Secondary | ICD-10-CM

## 2018-11-10 NOTE — Telephone Encounter (Signed)
She has not been to [place] yet due to COVID-she is too scared. Cannot due virtual due to being new patient. She is moving there in October and the office said they will get her in within two weeks whenever she calls them to let them know she is there. She will still be coming here for her care once she moves though.

## 2018-11-10 NOTE — Telephone Encounter (Signed)
Is she seeing psych now??

## 2018-11-10 NOTE — Telephone Encounter (Signed)
Is this okay to refill? Came under Dr. Redmond School name

## 2018-11-14 ENCOUNTER — Other Ambulatory Visit: Payer: Self-pay | Admitting: Family Medicine

## 2018-11-14 DIAGNOSIS — I10 Essential (primary) hypertension: Secondary | ICD-10-CM

## 2018-11-14 DIAGNOSIS — E039 Hypothyroidism, unspecified: Secondary | ICD-10-CM

## 2018-11-14 DIAGNOSIS — Z5181 Encounter for therapeutic drug level monitoring: Secondary | ICD-10-CM

## 2018-11-14 DIAGNOSIS — R748 Abnormal levels of other serum enzymes: Secondary | ICD-10-CM

## 2018-11-14 DIAGNOSIS — R7301 Impaired fasting glucose: Secondary | ICD-10-CM

## 2018-11-18 ENCOUNTER — Other Ambulatory Visit: Payer: Medicare Other

## 2018-11-22 ENCOUNTER — Other Ambulatory Visit: Payer: Self-pay

## 2018-11-22 ENCOUNTER — Other Ambulatory Visit: Payer: Medicare Other

## 2018-11-22 DIAGNOSIS — E039 Hypothyroidism, unspecified: Secondary | ICD-10-CM | POA: Diagnosis not present

## 2018-11-22 DIAGNOSIS — R7301 Impaired fasting glucose: Secondary | ICD-10-CM | POA: Diagnosis not present

## 2018-11-22 DIAGNOSIS — R748 Abnormal levels of other serum enzymes: Secondary | ICD-10-CM | POA: Diagnosis not present

## 2018-11-22 DIAGNOSIS — I10 Essential (primary) hypertension: Secondary | ICD-10-CM | POA: Diagnosis not present

## 2018-11-22 DIAGNOSIS — Z5181 Encounter for therapeutic drug level monitoring: Secondary | ICD-10-CM | POA: Diagnosis not present

## 2018-11-22 NOTE — Progress Notes (Signed)
Start time: 11:44 End time: 12:06  Virtual Visit via Video Note  I connected with Katie Kaiser on 11/23/2018 by a video enabled telemedicine application and verified that I am speaking with the correct person using two identifiers.  Location: Patient: home, alone Provider: office   I discussed the limitations of evaluation and management by telemedicine and the availability of in person appointments. The patient expressed understanding and agreed to proceed. Patient consents to have insurance filed for this visit.  History of Present Illness:  Chief Complaint  Patient presents with  . Hypothyroidism    VIRTUAL med check, no concerns.     Patient presents for 6 month med check.  She had labs done prior to her visit.  She saw Dr. Junius Roads (after seeing Dr. Redmond School) with complaints of left hip pain. He felt there was some mild OA, but also myofascial pain.  She was treated with prednisone taper, which helped immediately. He also recommended she take glucosamine and Magnesium. She hasn't had any recurrence of pain. Drinking more water has also helped.  Depression. In 05/2018 Abilify 2mg  was added to her regimen of fluoxetine 40mg  and wellbutrin 300mg .  PHQ-9 score was 10 at that time (prior to adding Abilify).  She was also given names of psychiatrists to consult with if not improving, to have medication regimen adjusted.  She initially felt like it was helping (PHQ-9 score of 2 a week later), but contacted [place] in 06/2018 reporting tremor in left leg and foot, felt easily agitated and more depressed. She was asked to f/u with psychiatrist, but has yet to do so. She has had to cancel appointments due to COVID, not taking new patients virtually.  We have been refilling her prozac and wellbutrin until she can be seen by psychiatrist.  She reports she is doing much better.  She will be moving to [place] in October. 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 "I think the job was killing me"--doing fine now  with her moods. Keeping herself busy with upcoming move.  Hypothyroidism:  Her dose of thyroid medication was decreased in November 2019 to 165mcg. Recheck in January was normal. She denies changes to energy/hair/skin/bowels/weight. Some hair thinning as she has gotten older, no loss. She is compliant with taking her medication on an empty stomach, separate from other medications.   Overactive bladder:  First noted at her visit in 03/2018, with complaints of leakage of urine, and frequency, getting up 2-3 times at night. Doesn't drink caffeine, limits fluids at night. No dysuria or odor. Has leakage with urgency, as well as with cough/sneeze. She is on diuretic for hypertension, takes in the morning. Her nighttime symptoms were bothersome to her, so we prescribed Detrol LA as a trial. She never filled it due to cost.  At her 05/2018 visit she was prescribed short-acting oxybutynin (least expensive). She didn't think it helped, and is now more careful about the timing of her liquid intake, and it doesn't bother her much anymore.  Hypertension: She reports compliance with her medications, denies side effects other than some urinary frequency. She is under the care of cardiology.  Currently is taking lisinopril 20mg  and HCTZ 25mg . She eats 1/2 banana daily. BP's are running120's-low 130's/60-80. She denies headaches, dizziness, syncope, chest pain, palpitations, DOE.  Aortic stenosis--mild per echo 02/2017; echo was repeated 03/2018 (after syncope), no change. Asymptomatic from this, no further dizziness/syncope. Denies angina or dyspnea.  H/o impaired fasting glucose Lab Results  Component Value Date  HGBA1C 5.9 (A) 05/26/2018  She tries to limit sweets and carbs. Only since her leg started feeling better, she has been exercising more regularly, and got back on intermittent fasting.  She feels like she is doing much better, clothes fit better (doesn't weigh herself). Rides her bike 20-30  minutes at least every other day. She continues doing intermittent fasting, not eating after 6-7pm (eating window is 6-7 hours).  PMH, PSH, SH reviewed  Outpatient Encounter Medications as of 11/24/2018  Medication Sig  . aspirin EC 81 MG tablet Take 81 mg by mouth daily.  Marland Kitchen buPROPion (WELLBUTRIN XL) 300 MG 24 hr tablet TAKE ONE TABLET BY MOUTH DAILY  . Cholecalciferol (VITAMIN D) 1000 UNITS capsule Take 1,000 Units by mouth daily.    Marland Kitchen esomeprazole (NEXIUM) 20 MG capsule Take 20 mg by mouth daily at 12 noon.  Marland Kitchen FLUoxetine (PROZAC) 40 MG capsule Take 1 capsule by mouth once daily  . hydrochlorothiazide (HYDRODIURIL) 25 MG tablet TAKE 1 TABLET BY MOUTH ONCE DAILY  . lisinopril (PRINIVIL,ZESTRIL) 20 MG tablet Take 1 tablet (20 mg total) by mouth every evening.  . Omega-3 Fatty Acids (FISH OIL) 1000 MG CAPS Take 2 mg daily by mouth.  . SYNTHROID 175 MCG tablet Take 1 tablet (175 mcg total) by mouth daily before breakfast.  . [DISCONTINUED] SYNTHROID 175 MCG tablet Take 1 tablet (175 mcg total) by mouth daily before breakfast.  . [DISCONTINUED] carisoprodol (SOMA) 350 MG tablet Take 1 tablet (350 mg total) by mouth 4 (four) times daily as needed for muscle spasms. (Patient not taking: Reported on 09/15/2018)  . [DISCONTINUED] oxybutynin (DITROPAN) 5 MG tablet Take 1/2 to 1 tablet up to three times daily (Patient not taking: Reported on 08/20/2018)  . [DISCONTINUED] predniSONE (DELTASONE) 10 MG tablet Take as directed for 12 days.  Daily dose 6,6,5,5,4,4,3,3,2,2,1,1.   No facility-administered encounter medications on file as of 11/24/2018.    Allergies  Allergen Reactions  . Dilaudid [Hydromorphone Hcl]     Lots of itching    ROS:  No fever, chills, URI symptoms, headaches, dizziness, shortness of breath.  No nausea, vomiting, reflux, bowel changes.  No fatigue, significant weight changes, hair/skin/nail changes.  Moods are good. Urinary frequency improved.  See HPI     Observations/Objective:  BP 132/74   Pulse 82   LMP 05/05/1990   Wt Readings from Last 3 Encounters:  08/20/18 243 lb 9.6 oz (110.5 kg)  07/07/18 244 lb (110.7 kg)  05/26/18 239 lb (108.4 kg)   Pleasant, well-appearing female, in no distress She is alert, oriented, and in good spirits. Normal mood, affect, grooming, eye contact and speech Cranial nerves are grossly intact.  Exam limited due to virtual nature of the visit.  PHQ-9 score of 0  Labs: Lab Results  Component Value Date   TSH 1.530 11/22/2018   Lab Results  Component Value Date   WBC 8.1 11/22/2018   HGB 12.0 11/22/2018   HCT 38.5 11/22/2018   MCV 81 11/22/2018   PLT 254 11/22/2018     Chemistry      Component Value Date/Time   NA 139 11/22/2018 1113   K 4.8 11/22/2018 1113   CL 102 11/22/2018 1113   CO2 23 11/22/2018 1113   BUN 13 11/22/2018 1113   CREATININE 0.73 11/22/2018 1113   CREATININE 0.71 02/12/2017 1105      Component Value Date/Time   CALCIUM 9.1 11/22/2018 1113   ALKPHOS 120 (H) 11/22/2018 1113   AST 28  11/22/2018 1113   ALT 27 11/22/2018 1113   BILITOT 0.4 11/22/2018 1113     Fasting glucose 95  Lab Results  Component Value Date   HGBA1C 6.1 (H) 11/22/2018     Assessment and Plan:  Hypothyroidism, unspecified type - adequately replaced on current dose, continue - Plan: SYNTHROID 175 MCG tablet  Impaired fasting glucose - counseled re: diet, exercise, low carb diet   Essential hypertension - well controlled  Mild aortic stenosis - asymptomatic  Overactive bladder - improved, no longer desiring medication   Class 3 severe obesity due to excess calories with serious comorbidity and body mass index (BMI) of 40.0 to 44.9 in adult [facility]) - strongly encouraged wt loss   Recurrent major depressive disorder, in full remission ([place]) - continue fluoxetine and wellbutrin (no need to see psych if continues to do well)    Recommended she establish care with new PCP once she moves  to [place] (she will ask her kids who they see)   Follow Up Instructions:    I discussed the assessment and treatment plan with the patient. The patient was provided an opportunity to ask questions and all were answered. The patient agreed with the plan and demonstrated an understanding of the instructions.   The patient was advised to call back or seek an in-person evaluation if the symptoms worsen or if the condition fails to improve as anticipated.  I provided 22 minutes of non-face-to-face time during this encounter.   Vikki Ports, MD

## 2018-11-23 LAB — HEMOGLOBIN A1C
Est. average glucose Bld gHb Est-mCnc: 128 mg/dL
Hgb A1c MFr Bld: 6.1 % — ABNORMAL HIGH (ref 4.8–5.6)

## 2018-11-23 LAB — COMPREHENSIVE METABOLIC PANEL
ALT: 27 IU/L (ref 0–32)
AST: 28 IU/L (ref 0–40)
Albumin/Globulin Ratio: 1.5 (ref 1.2–2.2)
Albumin: 4.1 g/dL (ref 3.8–4.8)
Alkaline Phosphatase: 120 IU/L — ABNORMAL HIGH (ref 39–117)
BUN/Creatinine Ratio: 18 (ref 12–28)
BUN: 13 mg/dL (ref 8–27)
Bilirubin Total: 0.4 mg/dL (ref 0.0–1.2)
CO2: 23 mmol/L (ref 20–29)
Calcium: 9.1 mg/dL (ref 8.7–10.3)
Chloride: 102 mmol/L (ref 96–106)
Creatinine, Ser: 0.73 mg/dL (ref 0.57–1.00)
GFR calc Af Amer: 96 mL/min/{1.73_m2} (ref >59)
GFR calc non Af Amer: 84 mL/min/{1.73_m2} (ref >59)
Globulin, Total: 2.7 g/dL (ref 1.5–4.5)
Glucose: 95 mg/dL (ref 65–99)
Potassium: 4.8 mmol/L (ref 3.5–5.2)
Sodium: 139 mmol/L (ref 134–144)
Total Protein: 6.8 g/dL (ref 6.0–8.5)

## 2018-11-23 LAB — CBC WITH DIFFERENTIAL/PLATELET
Basophils Absolute: 0 10*3/uL (ref 0.0–0.2)
Basos: 0 %
EOS (ABSOLUTE): 0.3 10*3/uL (ref 0.0–0.4)
Eos: 3 %
Hematocrit: 38.5 % (ref 34.0–46.6)
Hemoglobin: 12 g/dL (ref 11.1–15.9)
Immature Grans (Abs): 0.1 10*3/uL (ref 0.0–0.1)
Immature Granulocytes: 1 %
Lymphocytes Absolute: 1.7 10*3/uL (ref 0.7–3.1)
Lymphs: 21 %
MCH: 25.2 pg — ABNORMAL LOW (ref 26.6–33.0)
MCHC: 31.2 g/dL — ABNORMAL LOW (ref 31.5–35.7)
MCV: 81 fL (ref 79–97)
Monocytes Absolute: 0.8 10*3/uL (ref 0.1–0.9)
Monocytes: 9 %
Neutrophils Absolute: 5.3 10*3/uL (ref 1.4–7.0)
Neutrophils: 66 %
Platelets: 254 10*3/uL (ref 150–450)
RBC: 4.76 x10E6/uL (ref 3.77–5.28)
RDW: 15.5 % — ABNORMAL HIGH (ref 11.7–15.4)
WBC: 8.1 10*3/uL (ref 3.4–10.8)

## 2018-11-23 LAB — TSH: TSH: 1.53 u[IU]/mL (ref 0.450–4.500)

## 2018-11-24 ENCOUNTER — Encounter: Payer: Self-pay | Admitting: Family Medicine

## 2018-11-24 ENCOUNTER — Ambulatory Visit (INDEPENDENT_AMBULATORY_CARE_PROVIDER_SITE_OTHER): Payer: Medicare Other | Admitting: Family Medicine

## 2018-11-24 ENCOUNTER — Other Ambulatory Visit: Payer: Self-pay

## 2018-11-24 VITALS — BP 132/74 | HR 82

## 2018-11-24 DIAGNOSIS — Z6841 Body Mass Index (BMI) 40.0 and over, adult: Secondary | ICD-10-CM | POA: Diagnosis not present

## 2018-11-24 DIAGNOSIS — R7301 Impaired fasting glucose: Secondary | ICD-10-CM

## 2018-11-24 DIAGNOSIS — I10 Essential (primary) hypertension: Secondary | ICD-10-CM

## 2018-11-24 DIAGNOSIS — N3281 Overactive bladder: Secondary | ICD-10-CM

## 2018-11-24 DIAGNOSIS — E039 Hypothyroidism, unspecified: Secondary | ICD-10-CM | POA: Diagnosis not present

## 2018-11-24 DIAGNOSIS — F3342 Major depressive disorder, recurrent, in full remission: Secondary | ICD-10-CM | POA: Diagnosis not present

## 2018-11-24 DIAGNOSIS — I350 Nonrheumatic aortic (valve) stenosis: Secondary | ICD-10-CM | POA: Diagnosis not present

## 2018-11-24 MED ORDER — SYNTHROID 175 MCG PO TABS: 175.0000 ug | ORAL_TABLET | Freq: Every day | ORAL | 3 refills | Status: DC

## 2018-11-24 NOTE — Patient Instructions (Addendum)
Continue your current medications. Your thyroid and blood pressure are well controlled.  I encourage you to continue to work on weight loss. You only recently started back on intermittent fasting, so we can give that a chance to work.  Continue to try and get at least 150 minutes of aerobic exercise each week.  Contact your cardiologist if you develop any dizziness/fainting spells, shortness of breath, or chest pain with exertion, as these symptoms could indicate worsening of the valve problem.  I'm glad your bladder is doing better.  Reach out if it gets worse again and treatment is desired.  Your depression is well controlled on your current regimen (much better than in 05/2018).  There is no rush to see a psychiatrist at this point, but it might be a good idea to establish one before the holidays, in case that puts you into another recurrence of depression.  I do encourage you to find a PCP closer to your home once you move (ask you family for recommendations).  It will make your life a little easier.  We will miss you, so in non-COVID times, if you're around, stop by and say hi!  Stay well!!

## 2018-11-29 ENCOUNTER — Other Ambulatory Visit: Payer: Self-pay | Admitting: Physician Assistant

## 2018-12-09 ENCOUNTER — Other Ambulatory Visit: Payer: Self-pay | Admitting: *Deleted

## 2018-12-09 DIAGNOSIS — I10 Essential (primary) hypertension: Secondary | ICD-10-CM

## 2018-12-09 MED ORDER — HYDROCHLOROTHIAZIDE 25 MG PO TABS: 25.0000 mg | ORAL_TABLET | Freq: Every day | ORAL | 1 refills | Status: DC

## 2019-01-24 ENCOUNTER — Other Ambulatory Visit: Payer: Self-pay

## 2019-01-24 ENCOUNTER — Other Ambulatory Visit: Payer: Medicare Other

## 2019-01-24 ENCOUNTER — Other Ambulatory Visit (INDEPENDENT_AMBULATORY_CARE_PROVIDER_SITE_OTHER): Payer: Medicare Other

## 2019-01-24 DIAGNOSIS — Z23 Encounter for immunization: Secondary | ICD-10-CM | POA: Diagnosis not present

## 2019-01-25 ENCOUNTER — Other Ambulatory Visit: Payer: Self-pay | Admitting: Family Medicine

## 2019-01-25 ENCOUNTER — Telehealth: Payer: Self-pay | Admitting: Family Medicine

## 2019-01-25 DIAGNOSIS — F331 Major depressive disorder, recurrent, moderate: Secondary | ICD-10-CM

## 2019-01-25 NOTE — Telephone Encounter (Signed)
Pt called and states that at her appt yesterday she requested Prozac be sent in. Please send to [facility] on file.

## 2019-01-25 NOTE — Telephone Encounter (Signed)
done

## 2019-01-25 NOTE — Telephone Encounter (Signed)
She had a flu shot yesterday (nurse visit only), and nobody sent that message to me.  I did see the refill request come from her pharmacy today and took care of it.

## 2019-03-02 ENCOUNTER — Other Ambulatory Visit: Payer: Self-pay | Admitting: Family Medicine

## 2019-03-02 DIAGNOSIS — F325 Major depressive disorder, single episode, in full remission: Secondary | ICD-10-CM

## 2019-03-03 NOTE — Telephone Encounter (Signed)
Is this okay to refill? 

## 2019-03-18 ENCOUNTER — Ambulatory Visit (INDEPENDENT_AMBULATORY_CARE_PROVIDER_SITE_OTHER): Payer: Medicare Other | Admitting: Family Medicine

## 2019-03-18 ENCOUNTER — Other Ambulatory Visit: Payer: Self-pay

## 2019-03-18 ENCOUNTER — Encounter: Payer: Self-pay | Admitting: Family Medicine

## 2019-03-18 VITALS — BP 140/80 | HR 89 | Wt 251.8 lb

## 2019-03-18 DIAGNOSIS — M62838 Other muscle spasm: Secondary | ICD-10-CM | POA: Diagnosis not present

## 2019-03-18 MED ORDER — METHOCARBAMOL 500 MG PO TABS: 500.0000 mg | ORAL_TABLET | Freq: Four times a day (QID) | ORAL | 0 refills | Status: DC

## 2019-03-18 NOTE — Progress Notes (Signed)
Subjective:    Patient ID: Katie Kaiser, female    DOB: Oct 17, 1945, 73 y.o.   MRN: [id number]  HPI Chief Complaint  Patient presents with  . neck pain    brought a new pillow and neck has been hurting ever since   She is here with complaints of a 2 week history of right posterior neck pain. Pain is non radiating and constant but worse with head movement. No numbness, tingling or weakness of upper extremities.   States pain started after she started sleeping on a new firmer pillow.   Taking 2 Aleve twice daily and using heat. She has applied Volaren gel to the area and it helped. She has this medication for knee pain.   Denies fever, chills, dizziness, chest pain, palpitations, shortness of breath, abdominal pain, N/V/D, urinary symptoms, LE edema.   Denies pain to any other location.    Past Medical History:  Diagnosis Date  . Arthritis   . Depression   . Diverticulosis   . Essential hypertension   . GERD (gastroesophageal reflux disease)   . Heart murmur    slight murmur per pt  . Hypothyroid   . Impaired fasting glucose 5/07  . Kidney stone   . OAB (overactive bladder)   . Obesity   . Pneumonia   . Vitamin D deficiency    Past Surgical History:  Procedure Laterality Date  . APPENDECTOMY    . CESAREAN SECTION    . COLONOSCOPY  02/04/2011   diverticulosis  . ORIF FOREARM FRACTURE  2002  . THYROIDECTOMY  1975  . TONSILLECTOMY    . TOTAL KNEE ARTHROPLASTY Right 11/27/2015   Procedure: TOTAL KNEE ARTHROPLASTY;  Surgeon: Garald Balding, MD;  Location: [place];  Service: Orthopedics;  Laterality: Right;   Social History   Socioeconomic History  . Marital status: Single    Spouse name: Not on file  . Number of children: 1  . Years of education: Not on file  . Highest education level: Not on file  Occupational History  . Occupation: hairdresser    Employer: [employer]  Social Needs  . Financial resource strain: Not on file  . Food insecurity    Worry:  Not on file    Inability: Not on file  . Transportation needs    Medical: Not on file    Non-medical: Not on file  Tobacco Use  . Smoking status: Former Smoker    Quit date: 05/05/1978    Years since quitting: 40.8  . Smokeless tobacco: Never Used  Substance and Sexual Activity  . Alcohol use: Yes    Alcohol/week: 0.0 standard drinks    Comment: a glass of wine or beer once or twice a month or less  . Drug use: No  . Sexual activity: Not Currently  Lifestyle  . Physical activity    Days per week: Not on file    Minutes per session: Not on file  . Stress: Not on file  Relationships  . Social Herbalist on phone: Not on file    Gets together: Not on file    Attends religious service: Not on file    Active member of club or organization: Not on file    Attends meetings of clubs or organizations: Not on file    Relationship status: Not on file  . Intimate partner violence    Fear of current or ex partner: Not on file    Emotionally abused: Not  on file    Physically abused: Not on file    Forced sexual activity: Not on file  Other Topics Concern  . Not on file  Social History Narrative   Lives alone; has a friend subletting her downstairs. 3 dogs. Son lives in [place], [place] granddaughters, 4th due 05/2018   Hair dresser   Current Outpatient Medications on File Prior to Visit  Medication Sig Dispense Refill  . aspirin EC 81 MG tablet Take 81 mg by mouth daily.    Marland Kitchen buPROPion (WELLBUTRIN XL) 300 MG 24 hr tablet TAKE ONE TABLET BY MOUTH DAILY 90 tablet 0  . Cholecalciferol (VITAMIN D) 1000 UNITS capsule Take 1,000 Units by mouth daily.      Marland Kitchen esomeprazole (NEXIUM) 20 MG capsule Take 20 mg by mouth daily at 12 noon.    Marland Kitchen FLUoxetine (PROZAC) 40 MG capsule Take 1 capsule by mouth once daily 90 capsule 1  . hydrochlorothiazide (HYDRODIURIL) 25 MG tablet Take 1 tablet (25 mg total) by mouth daily. 90 tablet 1  . lisinopril (ZESTRIL) 20 MG tablet TAKE 1 TABLET BY MOUTH EVERY  EVENING 30 tablet 8  . Omega-3 Fatty Acids (FISH OIL) 1000 MG CAPS Take 2 mg daily by mouth.    . SYNTHROID 175 MCG tablet Take 1 tablet (175 mcg total) by mouth daily before breakfast. 90 tablet 3   No current facility-administered medications on file prior to visit.    Reviewed allergies, medications, past medical, surgical, family, and social history.    Review of Systems Pertinent positives and negatives in the history of present illness.     Objective:   Physical Exam Constitutional:      General: She is not in acute distress.    Appearance: Normal appearance. She is not ill-appearing.  Eyes:     Conjunctiva/sclera: Conjunctivae normal.  Neck:     Musculoskeletal: Neck supple. No neck rigidity.  Cardiovascular:     Rate and Rhythm: Normal rate and regular rhythm.     Pulses: Normal pulses.  Pulmonary:     Effort: Pulmonary effort is normal.     Breath sounds: Normal breath sounds.  Musculoskeletal:     Cervical back: She exhibits spasm.       Back:  Lymphadenopathy:     Cervical: No cervical adenopathy.  Skin:    General: Skin is warm and dry.     Capillary Refill: Capillary refill takes less than 2 seconds.     Coloration: Skin is not pale.     Findings: No rash.  Neurological:     General: No focal deficit present.     Mental Status: She is alert and oriented to person, place, and time.     Cranial Nerves: No cranial nerve deficit.     Sensory: No sensory deficit.     Motor: No weakness.    BP 140/80   Pulse 89   Wt 251 lb 12.8 oz (114.2 kg)   LMP 05/05/1990   BMI 43.56 kg/m       Assessment & Plan:  Neck muscle spasm - Plan: methocarbamol (ROBAXIN) 500 MG tablet  She is a pleasant 73 year old here today with an acute complaint of right posterior neck pain that appears to be a muscle spasm.  No red flag symptoms.  No radicular symptoms. she has taken muscle relaxants in the past without any side effects.  She will take Robaxin and is aware that it  may be sedating.  She will avoid driving or  alcohol with this medication.  She may also continue taking 1-2 Aleve twice daily for the next 2 to 3 days.  I also recommend using heat or ice or alternating between the 2 several times per day as well as a topical pain medication that she may get over-the-counter such as Biofreeze or salon pas.  Follow-up if symptoms are worsening or not improving over the next week.

## 2019-03-18 NOTE — Patient Instructions (Addendum)
Continue using heat or ice 3-4 times per day. Be careful to not burn your skin.   You may also want to use a topical medication such as Salon Pas, Biofreeze, etc.   Continue taking 1-2 Aleve twice daily for the next 2-3 days.   Take the muscle relaxant as needed but use caution as this medication can be sedating.

## 2019-03-23 NOTE — Progress Notes (Signed)
Chief Complaint  Patient presents with  . Neck Pain    saw Vickie on 03/18/2019, was given robaxin and didn't help at all. Neck pain is still the same as it was. Feels like the pain she had in her leg earlier this year, said that prednisone helped and wondered if that would help this as well.     Patient saw Vickie on 03/18/2019 with complaint of R sided neck pain. It was felt to be related to muscle spasm (R trapezius).  She was prescribed robaxin, told to continue 1-2 Aleve BID, use heat/ice, and topical medications.  Took robaxin 4/day, and it "did nothing".  She did this along with Aleve.  Today she reports being no better than when seen last week. No radiation into the arm, but spreads to the back of the head. No numbness/tingling/weakness on the right.  Pain had started 6 weeks ago, woke up with it, and has gotten gradually worse.  She tried Aleve, heat/ice/stretches, massage.  Doesn't bother her at night, sleeps fine.  She had changed pillows, switched back to the old one and hasn't improved.  She reports today that the pain is similar to pain she had in her left leg that required a course of prednisone.  She is asking for prednisone, as it worked so well for her leg, and nothing she has taken has worked so far for her neck.  She was supposed to move to [place] (move in with her son) in September.  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  She plans to move there in February.  Her house is mostly packed.  She misses her granddaughters, hasn't seen them since March.  Facetimes regularly and moods are otherwise overall good.  PMH, PSH, SH reviewed  Outpatient Encounter Medications as of 03/24/2019  Medication Sig  . aspirin EC 81 MG tablet Take 81 mg by mouth daily.  Marland Kitchen buPROPion (WELLBUTRIN XL) 300 MG 24 hr tablet TAKE ONE TABLET BY MOUTH DAILY  . Cholecalciferol (VITAMIN D) 1000 UNITS capsule Take 1,000 Units  by mouth daily.    Marland Kitchen esomeprazole (NEXIUM) 20 MG capsule Take 20 mg by mouth daily at 12 noon.  Marland Kitchen FLUoxetine (PROZAC) 40 MG capsule Take 1 capsule by mouth once daily  . hydrochlorothiazide (HYDRODIURIL) 25 MG tablet Take 1 tablet (25 mg total) by mouth daily.  Marland Kitchen lisinopril (ZESTRIL) 20 MG tablet TAKE 1 TABLET BY MOUTH EVERY EVENING  . Omega-3 Fatty Acids (FISH OIL) 1000 MG CAPS Take 2 mg daily by mouth.  . SYNTHROID 175 MCG tablet Take 1 tablet (175 mcg total) by mouth daily before breakfast.  . methocarbamol (ROBAXIN) 500 MG tablet Take 1 tablet (500 mg total) by mouth 4 (four) times daily. (Patient not taking: Reported on 03/24/2019)   No facility-administered encounter medications on file as of 03/24/2019.    Allergies  Allergen Reactions  . Dilaudid [Hydromorphone Hcl]     Lots of itching    ROS:  No fever, chills, URI symptoms, cough, shortness of breath, chest pain, rashes, GI complaints. Reflux is controlled.  Depression--"working on it"--rides bike most days to keep moving, helps moods.  Hasn't seen grandchildren since March, misses them, but facetimes regularly. Tremor L arm ever since her surgery, worse positionally.  Hand is numb, doesn't use it as much, thinks it may be weaker.  PHYSICAL EXAM:  BP 138/70   Pulse 92   Temp (!) 96.8 F (  36 C) (Other (Comment))   Ht 5' 3.75" (1.619 m)   Wt 251 lb 12.8 oz (114.2 kg)   LMP 05/05/1990   BMI 43.56 kg/m   Pleasant, well-appearing female, in no distress Neck: spine is nontender.  She is tender and tight over the muscles on R side of neck.  Extends down to the shoulder (trapezius--not whole muscle, but tight cord felt). She has some decreased ROM and pain with ROM. Normal strength and 2+ DTR on the right.  Normal sensation. Noted to have resting tremor L hand (related to her surgery, per pt) Heart: regular rate and rhythm Lungs: clear Neuro: alert and oriented, normal gait, strength, sensation (on RUE as reported  above) Psych: normal mood, affect, hygiene and grooming.  Proudly showing me pictures of her granddaughters.   ASSESSMENT/PLAN:  Neck muscle spasm - Plan: methylPREDNISolone (MEDROL DOSEPAK) 4 MG TBPK tablet, metaxalone (SKELAXIN) 800 MG tablet  Advised on proper use of heat, shown stretches. Reviewed risks/SE of prednisone and skelaxin. Advised to contact us on Monday if no significant improvement, for referral to PT.   25 min visit, more than 1/2 spent in counseling re: meds, showing exercises, discussing next steps.  STOP any ibuprofen or Aleve. Instead, start the medrol dosepak (steroid course). Instead of the methocarbamol, let's try a different muscle relaxant to see if it is more effective. You may use 1/2 - 1 tablet three times daily if needed.  Continue to use heat--apply heat at least 3-4 times daily. Do the stretches as shown at least 2-3 times daily, after you have had the heat on it, to soften the muscles and get a better stretch. (chin down, ear to shoulder, and looking over the shoulder are the 3 different stretches, do them on both sides).  Contact us Monday if your pain isn't significantly improved, for a referral to physical therapy.

## 2019-03-24 ENCOUNTER — Other Ambulatory Visit: Payer: Self-pay

## 2019-03-24 ENCOUNTER — Ambulatory Visit (INDEPENDENT_AMBULATORY_CARE_PROVIDER_SITE_OTHER): Payer: Medicare Other | Admitting: Family Medicine

## 2019-03-24 ENCOUNTER — Encounter: Payer: Self-pay | Admitting: Family Medicine

## 2019-03-24 VITALS — BP 138/70 | HR 92 | Temp 96.8°F | Ht 63.75 in | Wt 251.8 lb

## 2019-03-24 DIAGNOSIS — M62838 Other muscle spasm: Secondary | ICD-10-CM

## 2019-03-24 MED ORDER — METHYLPREDNISOLONE 4 MG PO TBPK: ORAL_TABLET | ORAL | 0 refills | Status: DC

## 2019-03-24 MED ORDER — METAXALONE 800 MG PO TABS: 400.0000 mg | ORAL_TABLET | Freq: Three times a day (TID) | ORAL | 0 refills | Status: DC | PRN

## 2019-03-24 NOTE — Patient Instructions (Signed)
STOP any ibuprofen or Aleve. Instead, start the medrol dosepak (steroid course). Instead of the methocarbamol, let's try a different muscle relaxant to see if it is more effective. You may use 1/2 - 1 tablet three times daily if needed.  Continue to use heat--apply heat at least 3-4 times daily. Do the stretches as shown at least 2-3 times daily, after you have had the heat on it, to soften the muscles and get a better stretch. (chin down, ear to shoulder, and looking over the shoulder are the 3 different stretches, do them on both sides).  Contact us Monday if your pain isn't significantly improved, for a referral to physical therapy.  Exercises below--do twice daily, 10 repetitions. Do only the stretching ones for now.  You can try the strengthening ones in the future, once pain is better.    Neck Exercises Ask your health care provider which exercises are safe for you. Do exercises exactly as told by your health care provider and adjust them as directed. It is normal to feel mild stretching, pulling, tightness, or discomfort as you do these exercises. Stop right away if you feel sudden pain or your pain gets worse. Do not begin these exercises until told by your health care provider. Neck exercises can be important for many reasons. They can improve strength and maintain flexibility in your neck, which will help your upper back and prevent neck pain. Stretching exercises Rotation neck stretching  1. Sit in a chair or stand up. 2. Place your feet flat on the floor, shoulder width apart. 3. Slowly turn your head (rotate) to the right until a slight stretch is felt. Turn it all the way to the right so you can look over your right shoulder. Do not tilt or tip your head. 4. Hold this position for 10-30 seconds. 5. Slowly turn your head (rotate) to the left until a slight stretch is felt. Turn it all the way to the left so you can look over your left shoulder. Do not tilt or tip your head.  6. Hold this position for 10-30 seconds. Repeat __________ times. Complete this exercise __________ times a day. Neck retraction 1. Sit in a sturdy chair or stand up. 2. Look straight ahead. Do not bend your neck. 3. Use your fingers to push your chin backward (retraction). Do not bend your neck for this movement. Continue to face straight ahead. If you are doing the exercise properly, you will feel a slight sensation in your throat and a stretch at the back of your neck. 4. Hold the stretch for 1-2 seconds. Repeat __________ times. Complete this exercise __________ times a day.   HOLD OFF ON THE FOLLOWING EXERCISES UNTIL YOU ARE BETTER: Strengthening exercises Neck press 1. Lie on your back on a firm bed or on the floor with a pillow under your head. 2. Use your neck muscles to push your head down on the pillow and straighten your spine. 3. Hold the position as well as you can. Keep your head facing up (in a neutral position) and your chin tucked. 4. Slowly count to 5 while holding this position. Repeat __________ times. Complete this exercise __________ times a day. Isometrics These are exercises in which you strengthen the muscles in your neck while keeping your neck still (isometrics). 1. Sit in a supportive chair and place your hand on your forehead. 2. Keep your head and face facing straight ahead. Do not flex or extend your neck while doing isometrics. 3. Push  forward with your head and neck while pushing back with your hand. Hold for 10 seconds. 4. Do the sequence again, this time putting your hand against the back of your head. Use your head and neck to push backward against the hand pressure. 5. Finally, do the same exercise on either side of your head, pushing sideways against the pressure of your hand. Repeat __________ times. Complete this exercise __________ times a day. Prone head lifts 1. Lie face-down (prone position), resting on your elbows so that your chest and upper  back are raised. 2. Start with your head facing downward, near your chest. Position your chin either on or near your chest. 3. Slowly lift your head upward. Lift until you are looking straight ahead. Then continue lifting your head as far back as you can comfortably stretch. 4. Hold your head up for 5 seconds. Then slowly lower it to your starting position. Repeat __________ times. Complete this exercise __________ times a day. Supine head lifts 1. Lie on your back (supine position), bending your knees to point to the ceiling and keeping your feet flat on the floor. 2. Lift your head slowly off the floor, raising your chin toward your chest. 3. Hold for 5 seconds. Repeat __________ times. Complete this exercise __________ times a day. Scapular retraction 1. Stand with your arms at your sides. Look straight ahead. 2. Slowly pull both shoulders (scapulae) backward and downward (retraction) until you feel a stretch between your shoulder blades in your upper back. 3. Hold for 10-30 seconds. 4. Relax and repeat. Repeat __________ times. Complete this exercise __________ times a day. Contact a health care provider if:  Your neck pain or discomfort gets much worse when you do an exercise.  Your neck pain or discomfort does not improve within 2 hours after you exercise. If you have any of these problems, stop exercising right away. Do not do the exercises again unless your health care provider says that you can. Get help right away if:  You develop sudden, severe neck pain. If this happens, stop exercising right away. Do not do the exercises again unless your health care provider says that you can. This information is not intended to replace advice given to you by your health care provider. Make sure you discuss any questions you have with your health care provider. Document Released: 04/02/2015 Document Revised: 02/17/2018 Document Reviewed: 02/17/2018 Elsevier Patient Education  2020 [employer].

## 2019-03-29 ENCOUNTER — Other Ambulatory Visit (INDEPENDENT_AMBULATORY_CARE_PROVIDER_SITE_OTHER): Payer: Self-pay | Admitting: Orthopedic Surgery

## 2019-03-29 NOTE — Telephone Encounter (Signed)
Please advise 

## 2019-04-14 ENCOUNTER — Other Ambulatory Visit: Payer: Self-pay

## 2019-04-14 ENCOUNTER — Ambulatory Visit (INDEPENDENT_AMBULATORY_CARE_PROVIDER_SITE_OTHER): Payer: Medicare Other | Admitting: Orthopaedic Surgery

## 2019-04-14 ENCOUNTER — Ambulatory Visit (INDEPENDENT_AMBULATORY_CARE_PROVIDER_SITE_OTHER): Payer: Medicare Other

## 2019-04-14 ENCOUNTER — Telehealth: Payer: Self-pay | Admitting: Orthopaedic Surgery

## 2019-04-14 ENCOUNTER — Encounter: Payer: Self-pay | Admitting: Orthopaedic Surgery

## 2019-04-14 VITALS — Ht 65.0 in | Wt 230.0 lb

## 2019-04-14 DIAGNOSIS — M25562 Pain in left knee: Secondary | ICD-10-CM

## 2019-04-14 DIAGNOSIS — M170 Bilateral primary osteoarthritis of knee: Secondary | ICD-10-CM | POA: Diagnosis not present

## 2019-04-14 MED ORDER — BUPIVACAINE HCL 0.5 % IJ SOLN
2.0000 mL | INTRAMUSCULAR | Status: AC | PRN
  Administered 2019-04-14: 17:00:00 2 mL via INTRA_ARTICULAR

## 2019-04-14 MED ORDER — METHYLPREDNISOLONE ACETATE 40 MG/ML IJ SUSP
80.0000 mg | INTRAMUSCULAR | Status: AC | PRN
  Administered 2019-04-14: 80 mg via INTRA_ARTICULAR

## 2019-04-14 MED ORDER — LIDOCAINE HCL 1 % IJ SOLN
2.0000 mL | INTRAMUSCULAR | Status: AC | PRN
  Administered 2019-04-14: 17:00:00 2 mL

## 2019-04-14 NOTE — Progress Notes (Signed)
Office Visit Note   Patient: Katie Kaiser           Date of Birth: [birth year]           MRN: [id number] Visit Date: 04/14/2019              Requested by: Rita Ohara, [address] [place],  [place] [address] PCP: Rita Ohara, MD   Assessment & Plan: Visit Diagnoses:  1. Acute pain of left knee   2. Bilateral primary osteoarthritis of knee     Plan: Advanced primary osteoarthritis left knee.  Will inject with cortisone and precertified Visco supplementation.  3 years status post primary right total knee replacement doing exceptionally well and very happy with the results. This patient is diagnosed with osteoarthritis of the knee(s).    Radiographs show evidence of joint space narrowing, osteophytes, subchondral sclerosis and/or subchondral cysts.  This patient has knee pain which interferes with functional and activities of daily living.    This patient has experienced inadequate response, adverse effects and/or intolerance with conservative treatments such as acetaminophen, NSAIDS, topical creams, physical therapy or regular exercise, knee bracing and/or weight loss.   This patient has experienced inadequate response or has a contraindication to intra articular steroid injections for at least 3 months.   This patient is not scheduled to have a total knee replacement within 6 months of starting treatment with viscosupplementation.   Follow-Up Instructions: Return Precertified Visco.   Orders:  Orders Placed This Encounter  Procedures  . Large Joint Inj: L knee  . XR KNEE 3 VIEW LEFT   No orders of the defined types were placed in this encounter.     Procedures: Large Joint Inj: L knee on 04/14/2019 4:36 PM Indications: pain and diagnostic evaluation Details: 25 G 1.5 in needle, anteromedial approach  Arthrogram: No  Medications: 2 mL lidocaine 1 %; 2 mL bupivacaine 0.5 %; 80 mg methylPREDNISolone acetate 40 MG/ML Procedure, treatment alternatives, risks  and benefits explained, specific risks discussed. Consent was given by the patient. Patient was prepped and draped in the usual sterile fashion.       Clinical Data: No additional findings.   Subjective: Chief Complaint  Patient presents with  . Left Knee - Pain  Patient presents today for left knee pain. She said that it has been hurting for two weeks. No known injury. She said that it started one day upon getting up in the posterior aspect of her knee. It now just hurts on and off now and feels weak. She is taking Aleve as needed. She said that she can feel it pop. It does not catch or give way.  Approximately 3 years status post primary right total knee replacement without any problems and happy with the results.  HPI  Review of Systems   Objective: Vital Signs: Ht 5\' 5"  (1.651 m)   Wt 230 lb (104.3 kg)   LMP 05/05/1990   BMI 38.27 kg/m   Physical Exam Constitutional:      Appearance: She is well-developed.  Eyes:     Pupils: Pupils are equal, round, and reactive to light.  Pulmonary:     Effort: Pulmonary effort is normal.  Skin:    General: Skin is warm and dry.  Neurological:     Mental Status: She is alert and oriented to person, place, and time.  Psychiatric:        Behavior: Behavior normal.     Ortho Exam awake alert and oriented  x3.  Comfortable sitting large left knee but no apparent effusion.  No increased heat.  Predominately medial joint pain with slight varus with weightbearing.  No lateral joint pain.  Mild patellar crepitation but no pain with patella compression.  No calf pain.  Motor exam intact.  Full extension flexed about 100 degrees without instability  Specialty Comments:  No specialty comments available.  Imaging: XR KNEE 3 VIEW LEFT  Result Date: 04/14/2019 Films of the left knee were obtained in 3 projections standing.  There is significant decrease in the medial joint space associated with subchondral sclerosis on both sides of the  joint and peripheral osteophytes.  There is about 2 to 3 degrees of varus.  There are osteophytes in the lateral compartment and patellofemoral compartment as well.  No ectopic calcification or acute change.  Films are consistent with advanced osteoarthritis    PMFS History: Patient Active Problem List   Diagnosis Date Noted  . Bilateral primary osteoarthritis of knee 04/14/2019  . Pneumonia   . Obesity   . OAB (overactive bladder)   . Kidney stone   . Hypothyroid   . Heart murmur   . GERD (gastroesophageal reflux disease)   . Depression   . Diverticulosis   . Arthritis   . Aortic stenosis, mild 02/18/2017  . Unilateral primary osteoarthritis, left knee 04/02/2016  . Primary osteoarthritis of right knee 11/27/2015  . S/P total knee replacement using cement 11/27/2015  . Overactive bladder 11/15/2015  . Essential hypertension 04/17/2015  . Atherosclerosis of both carotid arteries 03/14/2015  . Advance care planning 08/21/2014  . Vitamin D deficiency 06/08/2013  . Depression, major, in remission ([place]) 06/08/2013  . Obesity, Class III, BMI 40-49.9 (morbid obesity) ([place]) 02/04/2011  . Hypothyroidism 10/21/2010  . Impaired fasting glucose 09/02/2005   Past Medical History:  Diagnosis Date  . Arthritis   . Depression   . Diverticulosis   . Essential hypertension   . GERD (gastroesophageal reflux disease)   . Heart murmur    slight murmur per pt  . Hypothyroid   . Impaired fasting glucose 5/07  . Kidney stone   . OAB (overactive bladder)   . Obesity   . Pneumonia   . Vitamin D deficiency     Family History  Problem Relation Age of Onset  . Parkinsonism Mother   . Diabetes Brother   . Heart disease Brother        dx'd in 72's  . HIV Brother   . Arthritis Sister        rheumatoid  . Arthritis Sister        rheumatoid  . Bipolar disorder Sister   . Cancer Neg Hx   . Colon cancer Neg Hx   . Stomach cancer Neg Hx     Past Surgical History:  Procedure Laterality  Date  . APPENDECTOMY    . CESAREAN SECTION    . COLONOSCOPY  02/04/2011   diverticulosis  . ORIF FOREARM FRACTURE  2002  . THYROIDECTOMY  1975  . TONSILLECTOMY    . TOTAL KNEE ARTHROPLASTY Right 11/27/2015   Procedure: TOTAL KNEE ARTHROPLASTY;  Surgeon: Garald Balding, MD;  Location: [facility];  Service: Orthopedics;  Laterality: Right;   Social History   Occupational History  . Occupation: hairdresser    Fish farm manager: [employer]  Tobacco Use  . Smoking status: Former Smoker    Quit date: 05/05/1978    Years since quitting: 40.9  . Smokeless tobacco: Never Used  Substance  and Sexual Activity  . Alcohol use: Yes    Alcohol/week: 0.0 standard drinks    Comment: a glass of wine or beer once or twice a month or less  . Drug use: No  . Sexual activity: Not Currently

## 2019-04-14 NOTE — Telephone Encounter (Signed)
Please get precert for left knee visco injections. This is Dr.Whitifeld's patient.

## 2019-04-15 ENCOUNTER — Telehealth: Payer: Self-pay | Admitting: Orthopaedic Surgery

## 2019-04-15 NOTE — Telephone Encounter (Signed)
Thanks

## 2019-04-15 NOTE — Telephone Encounter (Signed)
fyi

## 2019-04-15 NOTE — Telephone Encounter (Signed)
Noted  

## 2019-04-15 NOTE — Telephone Encounter (Signed)
Pt called in wanting to let Dr.Whitfield know that the injection she received yesterday helped her pain a lot and she greatly appreciates it and just wanted to let him know that he is still her hero.   [phone]

## 2019-04-25 ENCOUNTER — Ambulatory Visit: Payer: Medicare Other | Admitting: Family Medicine

## 2019-05-03 ENCOUNTER — Ambulatory Visit: Payer: Self-pay

## 2019-05-03 ENCOUNTER — Encounter: Payer: Self-pay | Admitting: Family Medicine

## 2019-05-03 ENCOUNTER — Other Ambulatory Visit: Payer: Self-pay

## 2019-05-03 ENCOUNTER — Ambulatory Visit (INDEPENDENT_AMBULATORY_CARE_PROVIDER_SITE_OTHER): Payer: Medicare Other | Admitting: Family Medicine

## 2019-05-03 DIAGNOSIS — M542 Cervicalgia: Secondary | ICD-10-CM

## 2019-05-03 MED ORDER — HYDROCODONE-ACETAMINOPHEN 5-325 MG PO TABS: 1.0000 | ORAL_TABLET | Freq: Two times a day (BID) | ORAL | 0 refills | Status: DC | PRN

## 2019-05-03 NOTE — Progress Notes (Signed)
Office Visit Note   Patient: Katie Kaiser           Date of Birth: 12/31/1945           MRN: [id number] Visit Date: 05/03/2019 Requested by: Rita Ohara, [address] [place],  [place] [id number] PCP: Rita Ohara, MD  Subjective: Chief Complaint  Patient presents with  . Neck - Pain    Right-sided neck pain x 6 weeks - has gotten worse. NKI Heating pad gives temporary relief. No radiating pain down the arm.    HPI: She is here with right-sided neck pain.  Symptoms started about a month ago, no injury.  She noticed pain in the trapezius area which has since moved upward toward the occiput.  Heating pad gives temporary relief.  She was given prednisone and muscle relaxant by her PCP which only helped temporarily.  Pain does not radiate into the arm, denies any numbness or weakness.              ROS: No fevers or chills.  All other systems were reviewed and are negative.  Objective: Vital Signs: LMP 05/05/1990   Physical Exam:  General:  Alert and oriented, in no acute distress. Pulm:  Breathing unlabored. Psy:  Normal mood, congruent affect. Skin: No rash. Neck: He is tender to the right of midline near the occiput.  She has tender trigger points in the upper right cervical paraspinous muscles.  She has 4 tender trigger points in the trapezius belly.  Upper extremity strength and reflexes remain normal.  Imaging: X-ray cervical spine: She has a couple millimeters of anterolisthesis of C2 on C3.  There is moderate degenerative disc disease from C3-C6.  Multilevel facet arthropathy.  No sign of compression fracture or neoplasm.    Assessment & Plan: 1.  Right-sided neck pain, possibly myofascial but could be facet mediated pain -Discussed options with her, she is afraid to go to physical therapy due to concerns about exposure to COVID-19.  She would like to be referred for injection i applicable.  We will order MRI scan to further evaluate followed by consultation with  Dr. Ernestina Patches. -Hydrocodone for more severe pain, temporary only.     Procedures: No procedures performed  No notes on file     PMFS History: Patient Active Problem List   Diagnosis Date Noted  . Bilateral primary osteoarthritis of knee 04/14/2019  . Pneumonia   . Obesity   . OAB (overactive bladder)   . Kidney stone   . Hypothyroid   . Heart murmur   . GERD (gastroesophageal reflux disease)   . Depression   . Diverticulosis   . Arthritis   . Aortic stenosis, mild 02/18/2017  . Unilateral primary osteoarthritis, left knee 04/02/2016  . Primary osteoarthritis of right knee 11/27/2015  . S/P total knee replacement using cement 11/27/2015  . Overactive bladder 11/15/2015  . Essential hypertension 04/17/2015  . Atherosclerosis of both carotid arteries 03/14/2015  . Advance care planning 08/21/2014  . Vitamin D deficiency 06/08/2013  . Depression, major, in remission ([place]) 06/08/2013  . Obesity, Class III, BMI 40-49.9 (morbid obesity) ([place]) 02/04/2011  . Hypothyroidism 10/21/2010  . Impaired fasting glucose 09/02/2005   Past Medical History:  Diagnosis Date  . Arthritis   . Depression   . Diverticulosis   . Essential hypertension   . GERD (gastroesophageal reflux disease)   . Heart murmur    slight murmur per pt  . Hypothyroid   . Impaired fasting  glucose 5/07  . Kidney stone   . OAB (overactive bladder)   . Obesity   . Pneumonia   . Vitamin D deficiency     Family History  Problem Relation Age of Onset  . Parkinsonism Mother   . Diabetes Brother   . Heart disease Brother        dx'd in 34's  . HIV Brother   . Arthritis Sister        rheumatoid  . Arthritis Sister        rheumatoid  . Bipolar disorder Sister   . Cancer Neg Hx   . Colon cancer Neg Hx   . Stomach cancer Neg Hx     Past Surgical History:  Procedure Laterality Date  . APPENDECTOMY    . CESAREAN SECTION    . COLONOSCOPY  02/04/2011   diverticulosis  . ORIF FOREARM FRACTURE  2002  .  THYROIDECTOMY  1975  . TONSILLECTOMY    . TOTAL KNEE ARTHROPLASTY Right 11/27/2015   Procedure: TOTAL KNEE ARTHROPLASTY;  Surgeon: Garald Balding, MD;  Location: [facility];  Service: Orthopedics;  Laterality: Right;   Social History   Occupational History  . Occupation: hairdresser    Fish farm manager: [employer]  Tobacco Use  . Smoking status: Former Smoker    Quit date: 05/05/1978    Years since quitting: 41.0  . Smokeless tobacco: Never Used  Substance and Sexual Activity  . Alcohol use: Yes    Alcohol/week: 0.0 standard drinks    Comment: a glass of wine or beer once or twice a month or less  . Drug use: No  . Sexual activity: Not Currently

## 2019-05-15 ENCOUNTER — Other Ambulatory Visit: Payer: Medicare Other

## 2019-05-16 ENCOUNTER — Telehealth: Payer: Self-pay

## 2019-05-16 NOTE — Telephone Encounter (Signed)
Submitted VOB for Orthovisc, left knee.

## 2019-05-19 ENCOUNTER — Telehealth: Payer: Self-pay

## 2019-05-19 NOTE — Telephone Encounter (Signed)
Called and left a VM for patient to CB to schedule an appointment with Dr. Durward Fortes for gel injection.  Approved for Orthovisc series, left knee. Buy & Bill Must meet Medicare deductible first Patient will be responsible for 20% OOP. No Co-pay No PA required

## 2019-05-22 ENCOUNTER — Other Ambulatory Visit: Payer: Self-pay

## 2019-05-22 ENCOUNTER — Ambulatory Visit
Admission: RE | Admit: 2019-05-22 | Discharge: 2019-05-22 | Disposition: A | Discharge status: Home / Self Care | Payer: Medicare Other | Source: Ambulatory Visit | Attending: Family Medicine | Admitting: Family Medicine

## 2019-05-22 DIAGNOSIS — M542 Cervicalgia: Secondary | ICD-10-CM

## 2019-05-22 DIAGNOSIS — M4802 Spinal stenosis, cervical region: Secondary | ICD-10-CM | POA: Diagnosis not present

## 2019-05-23 ENCOUNTER — Ambulatory Visit: Payer: Self-pay

## 2019-05-23 ENCOUNTER — Encounter: Payer: Self-pay | Admitting: Physical Medicine and Rehabilitation

## 2019-05-23 ENCOUNTER — Ambulatory Visit (INDEPENDENT_AMBULATORY_CARE_PROVIDER_SITE_OTHER): Payer: Medicare Other | Admitting: Physical Medicine and Rehabilitation

## 2019-05-23 ENCOUNTER — Telehealth: Payer: Self-pay | Admitting: Family Medicine

## 2019-05-23 VITALS — BP 136/87 | HR 104

## 2019-05-23 DIAGNOSIS — M47812 Spondylosis without myelopathy or radiculopathy, cervical region: Secondary | ICD-10-CM

## 2019-05-23 DIAGNOSIS — M542 Cervicalgia: Secondary | ICD-10-CM

## 2019-05-23 MED ORDER — METHYLPREDNISOLONE ACETATE 80 MG/ML IJ SUSP
40.0000 mg | Freq: Once | INTRAMUSCULAR | Status: AC
  Administered 2019-05-23: 16:00:00 40 mg

## 2019-05-23 NOTE — Progress Notes (Signed)
 .  Numeric Pain Rating Scale and Functional Assessment Average Pain 4   In the last MONTH (on 0-10 scale) has pain interfered with the following?  1. General activity like being  able to carry out your everyday physical activities such as walking, climbing stairs, carrying groceries, or moving a chair?  Rating(6)   +Driver, -BT, -Dye Allergies.  

## 2019-05-23 NOTE — Telephone Encounter (Signed)
MRI shows arthritic bone spurs and bulging discs at multiple levels.  These things cause narrowing (stenosis) of the nerve openings and the spinal canal at multiple levels.  No definite indication for surgery at this point, but physical therapy or referral for epidural steroid or facet joint injections would be options for treatment.

## 2019-05-24 ENCOUNTER — Telehealth: Payer: Self-pay | Admitting: Family Medicine

## 2019-05-24 NOTE — Progress Notes (Signed)
Katie BURCHILL - 74 y.o. female MRN [id number]  Date of birth: January 02, 1946  Office Visit Note: Visit Date: 05/23/2019 PCP: Rita Ohara, MD Referred by: Rita Ohara, MD  Subjective: Chief Complaint  Patient presents with  . Neck - Pain   HPI: Katie Kaiser is a 74 y.o. female who comes in today At the request of Dr. Eunice Blase for cervical interventional spine procedure.  She has been having a couple of months of worsening severe chronic neck pain particularly on the right and really only on the right with a rotation to the right.  No real referral into the shoulder or trapezius.  She says initially when she first felt the pain that was going into the trapezius area.  She has been working with physical therapy some and this did seem to help her pain to some degree.  Her average pain is now 4 out of 10.  She has a lot of difficulty with rotation.  No real radicular pain.  Dr. Junius Roads did obtain MRI of the cervical spine.  She has not seen this image but he did go over it with her.  We went over it today with her and it is low.  We went over the images and spine model.  We elected to complete diagnostic and hopefully therapeutic C2-3, C3-4 and C4-5 facet joint blocks.  Depending on relief could look at double block paradigm and radiofrequency ablation.  If no relief would consider one-time epidural injection.  Patient has pretty significant degenerative changes of the cervical spine but without cord compression.  She does have a couple levels of moderate central stenosis.  She has reversal of the lordotic curvature and she has right more than left facet joint arthritis.  ROS Otherwise per HPI.  Assessment & Plan: Visit Diagnoses:  1. Cervical spondylosis without myelopathy     Plan: No additional findings.   Meds & Orders:  Meds ordered this encounter  Medications  . methylPREDNISolone acetate (DEPO-MEDROL) injection 40 mg    Orders Placed This Encounter  Procedures  . Facet  Injection  . XR C-ARM NO REPORT    Follow-up: Return if symptoms worsen or fail to improve.   Procedures: No procedures performed  Diagnostic Cervical Facet Joint Nerve Block with Fluoroscopic Guidance  Patient: DARRELYN Kaiser      Date of Birth: 1946-01-15 MRN: [id number] PCP: Rita Ohara, MD      Visit Date: 05/23/2019   Universal Protocol:    Date/Time: 01/19/216:02 AM  Consent Given By: the patient  Position: LATERAL  Additional Comments: Vital signs were monitored before and after the procedure. Patient was prepped and draped in the usual sterile fashion. The correct patient, procedure, and site was verified.   Injection Procedure Details:  Procedure Site One Meds Administered:  Meds ordered this encounter  Medications  . methylPREDNISolone acetate (DEPO-MEDROL) injection 40 mg     Laterality: Right  Location/Site:  C2-3 C3-4 C4-5  Needle size: 25 G  Needle type: Standard  Needle Placement: Articular Pillar  Findings:  -Contrast Used: 0.5 mL iohexol 180 mg iodine/mL   -Comments: There was excellent flow of contrast along the articular pillars without intravascular flow  Procedure Details: The fluoroscope beam was manipulated to achieve the best "true" lateral view possible by squaring off the endplates with cranial and caudal tilt and using varying obliquity to achieve the a view with the longest length of spinous process.  The region overlying the facet joints mentioned above  were then localized under fluoroscopic visualization.  The needle was inserted down to the center of the "trapezoid" outline of the facet joint lateral mass. Bi-planar images were used for confirming placement and spot radiographs were documented.  A 0.25 ml volume of Omnipaque-240 was injected to look for vascular uptake. A 0.5 ml. volume of the anesthetic solution was injected onto the target. This procedure was repeated for each medial branch nerve injected.  Prior to the  procedure, the patient was given a Pain Diary which was completed for baseline measurements.  After the procedure, the patient rated their pain every 30 minutes and will continue rating at this frequency for a total of 5 hours.  The patient has been asked to complete the Diary and return to [place] by mail, fax or hand delivered as soon as possible.   Additional Comments:  The patient tolerated the procedure well Dressing: Band-Aid    Post-procedure details: Patient was observed during the procedure. Post-procedure instructions were reviewed.  Patient left the clinic in stable condition.      Clinical History: MRI CERVICAL SPINE WITHOUT CONTRAST  TECHNIQUE: Multiplanar, multisequence MR imaging of the cervical spine was performed. No intravenous contrast was administered.  COMPARISON:  Prior radiograph from 05/03/2019.  FINDINGS: Alignment: Reversal of the normal cervical lordosis, apex at C3-4. 3 mm retrolisthesis of C4 on C5, with trace retrolisthesis of C5 on C6. Trace 2 mm anterolisthesis of C6 on C7 and C7 on T1. Findings chronic and facet mediated.  Vertebrae: Vertebral body height maintained without evidence for acute or chronic fracture. Bone marrow signal intensity normal. No discrete or worrisome osseous lesions. Mild reactive marrow edema about the left C6-7 facet due to facet arthritis (series 7, image 12). No other abnormal marrow edema.  Cord: Signal intensity within the cervical spinal cord is within normal limits.  Posterior Fossa, vertebral arteries, paraspinal tissues: Visualized brain and posterior fossa within normal limits. Craniocervical junction normal. Paraspinous and prevertebral soft tissues within normal limits. Normal intravascular flow voids seen within the vertebral arteries bilaterally.  Disc levels:  C2-C3: Mild bilateral uncovertebral hypertrophy without significant disc bulge. Right greater than left facet degeneration.  No significant spinal stenosis. Mild right C3 foraminal narrowing.  C3-C4: Diffuse disc bulge with bilateral uncovertebral hypertrophy. Broad posterior disc osteophyte flattens and partially effaces the ventral thecal sac. Mild spinal stenosis with mild cord flattening. Mild right C4 foraminal stenosis. No significant left foraminal narrowing.  C4-C5: Chronic intervertebral disc space narrowing with diffuse degenerative disc osteophyte. Broad posterior component flattens and partially faces the ventral thecal sac, eccentric to the left. Mild cord flattening without cord signal changes. Resultant moderate spinal stenosis. Mild left greater than right C5 foraminal stenosis.  C5-C6: Diffuse degenerative disc osteophyte, slightly asymmetric to the right. Broad posterior component flattens and partially faces the ventral thecal sac results in mild to moderate spinal stenosis. Right greater than left uncovertebral hypertrophy with resultant moderate right and mild left C6 foraminal narrowing.  C6-C7: Mild diffuse disc bulge. Bilateral facet hypertrophy. No significant spinal stenosis. Foramina remain patent.  C7-T1: Trace anterolisthesis. Moderate right with mild left facet hypertrophy. No significant spinal stenosis. Mild right C8 foraminal narrowing.  Visualized upper thoracic spine demonstrates no significant finding.  IMPRESSION: 1. Multilevel cervical spondylosis with resultant mild to moderate diffuse spinal stenosis at C3-4 through C5-6. 2. Multifactorial degenerative changes with resultant multilevel foraminal narrowing as above. Notable findings include mild right C4 and bilateral C5 foraminal stenosis, moderate right with mild left  C6 foraminal narrowing, with mild right C8 foraminal stenosis. 3. Mild reactive marrow edema about the left C6-7 facet due to facet arthritis. Finding could serve as a source for neck pain.   Electronically Signed   By: Jeannine Boga M.D.   On: 05/22/2019 19:34   She reports that she quit smoking about 41 years ago. She has never used smokeless tobacco.  Recent Labs    05/26/18 1422 11/22/18 1113  HGBA1C 5.9* 6.1*    Objective:  VS:  HT:    WT:   BMI:     BP:136/87  HR:(!) 104bpm  TEMP: ( )  RESP:  Physical Exam Constitutional:      General: She is not in acute distress.    Appearance: Normal appearance. She is not ill-appearing.  HENT:     Head: Normocephalic and atraumatic.     Right Ear: External ear normal.     Left Ear: External ear normal.  Eyes:     Extraocular Movements: Extraocular movements intact.  Cardiovascular:     Rate and Rhythm: Normal rate.     Pulses: Normal pulses.  Musculoskeletal:        General: Tenderness present. No swelling.     Comments: Patient sits with forward flexed cervical spine.  She has limited range of motion with right rotation compared to left.  She has good forward flexion without pain some pain with extension and facet loading which is concordant on the right.  She has good strength in the upper extremities bilaterally.  Skin:    General: Skin is warm and dry.     Findings: No erythema, lesion or rash.  Neurological:     General: No focal deficit present.     Mental Status: She is alert and oriented to person, place, and time.     Sensory: No sensory deficit.     Motor: No weakness or abnormal muscle tone.     Coordination: Coordination normal.  Psychiatric:        Mood and Affect: Mood normal.        Behavior: Behavior normal.     Ortho Exam Imaging: XR C-ARM NO REPORT  Result Date: 05/23/2019 Please see Notes tab for imaging impression.   Past Medical/Family/Surgical/Social History: Medications & Allergies reviewed per EMR, new medications updated. Patient Active Problem List   Diagnosis Date Noted  . Bilateral primary osteoarthritis of knee 04/14/2019  . Pneumonia   . Obesity   . OAB (overactive bladder)   . Kidney stone   .  Hypothyroid   . Heart murmur   . GERD (gastroesophageal reflux disease)   . Depression   . Diverticulosis   . Arthritis   . Aortic stenosis, mild 02/18/2017  . Unilateral primary osteoarthritis, left knee 04/02/2016  . Primary osteoarthritis of right knee 11/27/2015  . S/P total knee replacement using cement 11/27/2015  . Overactive bladder 11/15/2015  . Essential hypertension 04/17/2015  . Atherosclerosis of both carotid arteries 03/14/2015  . Advance care planning 08/21/2014  . Vitamin D deficiency 06/08/2013  . Depression, major, in remission ([place]) 06/08/2013  . Obesity, Class III, BMI 40-49.9 (morbid obesity) ([place]) 02/04/2011  . Hypothyroidism 10/21/2010  . Impaired fasting glucose 09/02/2005   Past Medical History:  Diagnosis Date  . Arthritis   . Depression   . Diverticulosis   . Essential hypertension   . GERD (gastroesophageal reflux disease)   . Heart murmur    slight murmur per pt  . Hypothyroid   .  Impaired fasting glucose 5/07  . Kidney stone   . OAB (overactive bladder)   . Obesity   . Pneumonia   . Vitamin D deficiency    Family History  Problem Relation Age of Onset  . Parkinsonism Mother   . Diabetes Brother   . Heart disease Brother        dx'd in 54's  . HIV Brother   . Arthritis Sister        rheumatoid  . Arthritis Sister        rheumatoid  . Bipolar disorder Sister   . Cancer Neg Hx   . Colon cancer Neg Hx   . Stomach cancer Neg Hx    Past Surgical History:  Procedure Laterality Date  . APPENDECTOMY    . CESAREAN SECTION    . COLONOSCOPY  02/04/2011   diverticulosis  . ORIF FOREARM FRACTURE  2002  . THYROIDECTOMY  1975  . TONSILLECTOMY    . TOTAL KNEE ARTHROPLASTY Right 11/27/2015   Procedure: TOTAL KNEE ARTHROPLASTY;  Surgeon: Garald Balding, MD;  Location: [facility];  Service: Orthopedics;  Laterality: Right;   Social History   Occupational History  . Occupation: hairdresser    Fish farm manager: [employer]  Tobacco Use  . Smoking  status: Former Smoker    Quit date: 05/05/1978    Years since quitting: 41.0  . Smokeless tobacco: Never Used  Substance and Sexual Activity  . Alcohol use: Yes    Alcohol/week: 0.0 standard drinks    Comment: a glass of wine or beer once or twice a month or less  . Drug use: No  . Sexual activity: Not Currently

## 2019-05-24 NOTE — Procedures (Signed)
Diagnostic Cervical Facet Joint Nerve Block with Fluoroscopic Guidance  Patient: Katie Kaiser      Date of Birth: 09/12/1945 MRN: [id number] PCP: Rita Ohara, MD      Visit Date: 05/23/2019   Universal Protocol:    Date/Time: 01/19/216:02 AM  Consent Given By: the patient  Position: LATERAL  Additional Comments: Vital signs were monitored before and after the procedure. Patient was prepped and draped in the usual sterile fashion. The correct patient, procedure, and site was verified.   Injection Procedure Details:  Procedure Site One Meds Administered:  Meds ordered this encounter  Medications  . methylPREDNISolone acetate (DEPO-MEDROL) injection 40 mg     Laterality: Right  Location/Site:  C2-3 C3-4 C4-5  Needle size: 25 G  Needle type: Standard  Needle Placement: Articular Pillar  Findings:  -Contrast Used: 0.5 mL iohexol 180 mg iodine/mL   -Comments: There was excellent flow of contrast along the articular pillars without intravascular flow  Procedure Details: The fluoroscope beam was manipulated to achieve the best "true" lateral view possible by squaring off the endplates with cranial and caudal tilt and using varying obliquity to achieve the a view with the longest length of spinous process.  The region overlying the facet joints mentioned above were then localized under fluoroscopic visualization.  The needle was inserted down to the center of the "trapezoid" outline of the facet joint lateral mass. Bi-planar images were used for confirming placement and spot radiographs were documented.  A 0.25 ml volume of Omnipaque-240 was injected to look for vascular uptake. A 0.5 ml. volume of the anesthetic solution was injected onto the target. This procedure was repeated for each medial branch nerve injected.  Prior to the procedure, the patient was given a Pain Diary which was completed for baseline measurements.  After the procedure, the patient rated their pain  every 30 minutes and will continue rating at this frequency for a total of 5 hours.  The patient has been asked to complete the Diary and return to [place] by mail, fax or hand delivered as soon as possible.   Additional Comments:  The patient tolerated the procedure well Dressing: Band-Aid    Post-procedure details: Patient was observed during the procedure. Post-procedure instructions were reviewed.  Patient left the clinic in stable condition.

## 2019-05-24 NOTE — Telephone Encounter (Signed)
Pt called and said she needs a note faxed to unemployment stating that she is high risk and unable to go back to work right now so they can extend her unemployment. Their fax number is [phone] and she said put her claimant id on the fax [id number]

## 2019-05-25 ENCOUNTER — Encounter: Payer: Self-pay | Admitting: Orthopaedic Surgery

## 2019-05-25 ENCOUNTER — Encounter: Payer: Self-pay | Admitting: Family Medicine

## 2019-05-25 ENCOUNTER — Other Ambulatory Visit: Payer: Self-pay

## 2019-05-25 ENCOUNTER — Ambulatory Visit (INDEPENDENT_AMBULATORY_CARE_PROVIDER_SITE_OTHER): Payer: Medicare Other | Admitting: Orthopaedic Surgery

## 2019-05-25 VITALS — Ht 65.0 in | Wt 230.0 lb

## 2019-05-25 DIAGNOSIS — M1712 Unilateral primary osteoarthritis, left knee: Secondary | ICD-10-CM | POA: Diagnosis not present

## 2019-05-25 MED ORDER — HYALURONAN 30 MG/2ML IX SOSY
30.0000 mg | PREFILLED_SYRINGE | INTRA_ARTICULAR | Status: AC | PRN
  Administered 2019-05-25: 30 mg via INTRA_ARTICULAR

## 2019-05-25 NOTE — Telephone Encounter (Signed)
Letter typed and faxed per pts request

## 2019-05-25 NOTE — Telephone Encounter (Signed)
Okay to write letter stating: Katie Kaiser's job puts her in close contact with the public. She is at high risk for complications from COVID due to her age, morbid obesity and hypertension and does not feel comfortable working until she can be vaccinated against COVID-19.   Put the claimant ID on it and fax to # stated in your message.  Thanks

## 2019-05-25 NOTE — Progress Notes (Signed)
Office Visit Note   Patient: Katie Kaiser           Date of Birth: [birth year]           MRN: [id number] Visit Date: 05/25/2019              Requested by: Rita Ohara, [address] [place] [place],  [place] [address] PCP: Rita Ohara, MD   Assessment & Plan: Visit Diagnoses:  1. Unilateral primary osteoarthritis, left knee     Plan: First Orthovisc injection left knee today.  Return weekly for the next 2 weeks to complete the series  Follow-Up Instructions: Return in about 1 week (around 06/01/2019).   Orders:  Orders Placed This Encounter  Procedures  . Large Joint Inj: L knee   No orders of the defined types were placed in this encounter.     Procedures: Large Joint Inj: L knee on 05/25/2019 12:13 PM Indications: pain and joint swelling Details: 25 G 1.5 in needle, anteromedial approach  Arthrogram: No  Medications: 30 mg Hyaluronan 30 MG/2ML Outcome: tolerated well, no immediate complications Procedure, treatment alternatives, risks and benefits explained, specific risks discussed. Consent was given by the patient. Immediately prior to procedure a time out was called to verify the correct patient, procedure, equipment, support staff and site/side marked as required. Patient was prepped and draped in the usual sterile fashion.       Clinical Data: No additional findings.   Subjective: Chief Complaint  Patient presents with  . Left Knee - Follow-up    Left knee orthovisc started 05/25/2019  Patient presents today for the first injection in the series of Orthovisc for her left knee.   HPI  Review of Systems   Objective: Vital Signs: Ht 5\' 5"  (1.651 m)   Wt 230 lb (104.3 kg)   LMP 05/05/1990   BMI 38.27 kg/m   Physical Exam  Ortho Exam large knees.  Possibly small effusion left knee.  Increased varus.  Mild medial joint pain.  Knee was not warm or hot  Specialty Comments:  No specialty comments available.  Imaging: No results  found.   PMFS History: Patient Active Problem List   Diagnosis Date Noted  . Bilateral primary osteoarthritis of knee 04/14/2019  . Pneumonia   . Obesity   . OAB (overactive bladder)   . Kidney stone   . Hypothyroid   . Heart murmur   . GERD (gastroesophageal reflux disease)   . Depression   . Diverticulosis   . Arthritis   . Aortic stenosis, mild 02/18/2017  . Unilateral primary osteoarthritis, left knee 04/02/2016  . Primary osteoarthritis of right knee 11/27/2015  . S/P total knee replacement using cement 11/27/2015  . Overactive bladder 11/15/2015  . Essential hypertension 04/17/2015  . Atherosclerosis of both carotid arteries 03/14/2015  . Advance care planning 08/21/2014  . Vitamin D deficiency 06/08/2013  . Depression, major, in remission ([place]) 06/08/2013  . Obesity, Class III, BMI 40-49.9 (morbid obesity) ([place]) 02/04/2011  . Hypothyroidism 10/21/2010  . Impaired fasting glucose 09/02/2005   Past Medical History:  Diagnosis Date  . Arthritis   . Depression   . Diverticulosis   . Essential hypertension   . GERD (gastroesophageal reflux disease)   . Heart murmur    slight murmur per pt  . Hypothyroid   . Impaired fasting glucose 5/07  . Kidney stone   . OAB (overactive bladder)   . Obesity   . Pneumonia   . Vitamin D deficiency  Family History  Problem Relation Age of Onset  . Parkinsonism Mother   . Diabetes Brother   . Heart disease Brother        dx'd in 23's  . HIV Brother   . Arthritis Sister        rheumatoid  . Arthritis Sister        rheumatoid  . Bipolar disorder Sister   . Cancer Neg Hx   . Colon cancer Neg Hx   . Stomach cancer Neg Hx     Past Surgical History:  Procedure Laterality Date  . APPENDECTOMY    . CESAREAN SECTION    . COLONOSCOPY  02/04/2011   diverticulosis  . ORIF FOREARM FRACTURE  2002  . THYROIDECTOMY  1975  . TONSILLECTOMY    . TOTAL KNEE ARTHROPLASTY Right 11/27/2015   Procedure: TOTAL KNEE ARTHROPLASTY;   Surgeon: Garald Balding, MD;  Location: [facility];  Service: Orthopedics;  Laterality: Right;   Social History   Occupational History  . Occupation: hairdresser    Fish farm manager: [employer]  Tobacco Use  . Smoking status: Former Smoker    Quit date: 05/05/1978    Years since quitting: 41.0  . Smokeless tobacco: Never Used  Substance and Sexual Activity  . Alcohol use: Yes    Alcohol/week: 0.0 standard drinks    Comment: a glass of wine or beer once or twice a month or less  . Drug use: No  . Sexual activity: Not Currently

## 2019-05-26 ENCOUNTER — Ambulatory Visit: Payer: Medicare Other | Attending: Internal Medicine

## 2019-05-26 DIAGNOSIS — Z23 Encounter for immunization: Secondary | ICD-10-CM | POA: Insufficient documentation

## 2019-05-26 NOTE — Progress Notes (Signed)
   Covid-19 Vaccination Clinic  Name:  SERYN BARTIE    MRN: [id number] DOB: 10/27/1945  05/26/2019  Ms. Giacona was observed post Covid-19 immunization for 15 minutes without incidence. She was provided with Vaccine Information Sheet and instruction to access the V-Safe system.   Ms. Aills was instructed to call 911 with any severe reactions post vaccine: Marland Kitchen Difficulty breathing  . Swelling of your face and throat  . A fast heartbeat  . A bad rash all over your body  . Dizziness and weakness    Immunizations Administered    Name Date Dose VIS Date Route   Pfizer COVID-19 Vaccine 05/26/2019  2:01 PM 0.3 mL 04/15/2019 Intramuscular   Manufacturer: [place]   Lot: [id number]   [place]: [id number]

## 2019-05-29 NOTE — Progress Notes (Deleted)
Katie Kaiser is a 74 y.o. female who presents for annual wellness visit and follow-up on chronic medical conditions.  She has the following concerns:  She saw Dr. Durward Fortes last week and got first Orthovisc injection into the L knee. She got R sided cervical facet injection from Dr. Ernestina Patches on 05/23/19. She had seen Dr. Junius Roads, who ordered MRI, done 05/22/19: IMPRESSION: 1. Multilevel cervical spondylosis with resultant mild to moderate diffuse spinal stenosis at C3-4 through C5-6. 2. Multifactorial degenerative changes with resultant multilevel foraminal narrowing as above. Notable findings include mild right C4 and bilateral C5 foraminal stenosis, moderate right with mild left C6 foraminal narrowing, with mild right C8 foraminal stenosis. 3. Mild reactive marrow edema about the left C6-7 facet due to facet arthritis. Finding could serve as a source for neck pain.  Depression. In 1/2020Abilify 48m was added to her regimen of fluoxetine 455mand wellbutrin 30027mue to depression not being well controlled. PHQ-9 score was 10 at that time (prior to adding Abilify). She was also given names of psychiatrists to consult with if not improving, to have medication regimen adjusted. She initially felt like it was helping (PHQ-9 score of 2 a week later), but contacted us [place] 06/2018 reporting tremor in left leg and foot, felt easily agitated and more depressed, and she stopped the medication. She wasn't able to get in with a psychiatrist.  She continued on Prozac and Wellbutrin, and has been doing much better. A lot of that is related to not working (related to pandemic).  Last PHQ-9 score was 0 in 11/2018.   She has not yet moved to [place] live with her son.  Hypothyroidism: She continues on Synthroid 175m53mtaking on an empty stomach, separate from other medications. She denies changes to energy/hair/skin/bowels/weight. Some hair thinning as she has gotten older, no loss.  Lab Results   Component Value Date   TSH 1.530 11/22/2018   Hypertension:She reports compliance with her medications, denies side effects other than some urinary frequency. She is under the care of cardiology.  Currently is taking lisinopril 20mg110m HCTZ 25mg.28m eats 1/2 banana daily. BP's are running120's-low 130's/60-80. She denies headaches, dizziness, syncope, chest pain, palpitations, DOE.  Aortic stenosis--mild per echo 02/2017; echo was repeated 03/2018(after syncope), no change. Asymptomatic from this, no further dizziness/syncope. Denies angina or dyspnea.  H/o impaired fasting glucose:  She tries to limit sweets and carbs.   She rides her bike 20-30 minutes every other day. She continues to do intermittent fasting (not eating after 6-7 pm, eating window of 6-7 hours).  Lab Results  Component Value Date   HGBA1C 6.1 (H) 11/22/2018    Overactive bladder:  Discussed in 03/2018, with complaints of leakage of urine, and frequency, getting up 2-3 times at night. Leakage with urgency as well as with cough/sneeze.  She takes her diuretic in the morning.  Given trial of oxybutynin in 05/2018, didn't think it helped, and nighttime symptoms improves when more careful with the timing of her liquid intake.  It no longer is much of a concern to her.   Immunization History  Administered Date(s) Administered  . Fluad Quad(high Dose 65+) 01/24/2019  . Influenza Split 02/01/2012, 01/03/2013, 03/03/2014, 03/03/2014  . Influenza Whole 03/16/2011  . Influenza, High Dose Seasonal PF 03/14/2015, 03/19/2016, 02/12/2017, 03/17/2018  . PFIZER SARS-COV-2 Vaccination 05/26/2019  . Pneumococcal Conjugate-13 06/08/2013  . Pneumococcal Polysaccharide-23 03/03/2014  . Td 10/09/1997  . Tdap 08/02/2007, 03/16/2013  . Zoster 03/17/2011  Last Pap smear: 02/2017 normal, no high risk HPV present Last mammogram: 06/2018 at [place] colonoscopy: 02/2011 Dr. Carlean Purl, diverticulosis Last DEXA: 06/2018 (at [facility]), T  -2.0 at R fem neck. Dentist: twice yearly Ophtho:last was 3 years ago UPDATE Exercise:recumbent bike, 20 minutes 4x/week. Lipids: Lab Results  Component Value Date   CHOL 173 10/18/2015   HDL 56 10/18/2015   LDLCALC 105 10/18/2015   TRIG 58 10/18/2015   CHOLHDL 3.1 10/18/2015   Vitamin D screen normal at 50 in 10/2015  Other doctors caring for patient include: Ophtho:Dr. Nicki Reaper at [facility] Dentist: [employer] Dermatologist: Dr. Allyson Sabal (retired) GI: Dr. Carlean Purl Ortho: [facility] (sees Dr. Rudene Anda PA, Biagio Borg, and Dr. Junius Roads)  Cardiologist: Dr. Eulas Post, Dr. Irish Lack  Depression screen:  Fall screen:None in the last year Function Status screen: notable onlyfor leakage of urine Mini-Cog screen: Normal See full screens in Epic.  End of Life Discussion: She signed something with [employer], thinks it was notarized; took new forms, will double check. UPDATE    ROS: The patient denies anorexia, fever, headaches, vision changes, decreased hearing, ear pain, sore throat, breast concerns, chest pain, palpitations, dizziness, syncope, dyspnea on exertion, cough, swelling, nausea, vomiting, diarrhea, constipation, melena, hematochezia, indigestion/heartburn, hematuria, dysuria, vaginal bleeding, discharge, odor or itch, genital lesions, numbness, tingling, weakness, tremor, suspicious skin lesions, abnormal bleeding/bruising, or enlarged lymph nodes. +urinary incontinence (cough/sneeze/urgency) Tremor in left arm/hand since injury/surgery, unchanged Neck pain Left knee pain depression    PHYSICAL EXAM:  Wt Readings from Last 3 Encounters:  05/25/19 230 lb (104.3 kg)  04/14/19 230 lb (104.3 kg)  03/24/19 251 lb 12.8 oz (114.2 kg)   General Appearance:   Alert, cooperative, no distress, appears stated age  Head:   Normocephalic, without obvious abnormality, atraumatic  Eyes:   PERRL, conjunctiva/corneas clear, EOM's intact, fundi  benign  Ears:   Normal TM's and external ear canals.  Nose: Not examined, wearing mask due to COVID-19 pandemic  Throat:  Not examined, wearing mask due to COVID-19 pandemic  Neck: Supple, no lymphadenopathy; thyroid: no enlargement/ tenderness/nodules; no carotid bruit or JVD  Back:  Spine nontender, no curvature, ROM normal, no CVA tenderness  Lungs:   Clear to auscultation bilaterally without wheezes, rales or ronchi; respirations unlabored  Chest Wall:   No tenderness or deformity  Heart:   Regular rate and rhythm, S1 and S2 normal, no rub or gallop. 3/6 SEM heard loudest at RUSB  Breast Exam:   No tenderness, masses, or nipple discharge or inversion. No axillary lymphadenopathy  Abdomen:   Soft, non-tender, nondistended, normoactive bowel sounds, no masses, no hepatosplenomegaly  Genitalia:   Normal external genitalia without lesions; atrophic changes noted. BUS and vagina normal; no cervical motion tenderness. No abnormal vaginal discharge. Uterus and adnexa not enlarged, nontender, no masses.Pap not performed  Rectal:   Normal tone, no masses or tenderness; guaiac negative stool  Extremities:  No clubbing, cyanosis. Wearing compression socks, and trace pretibial edema noted through socks. Varicose veins noted left medial thigh, nontender  Pulses:  2+ and symmetric all extremities  Skin:  Skin color, texture, turgor normal, no rashes or lesions  Lymph nodes:  Cervical, supraclavicular, and axillary nodes normal  Neurologic:  CNII-XII intact, normal strength, sensation and gait; reflexes 2+ and symmetric throughout   Psych: Normal mood, affect, hygiene and grooming  PHQ-9 score of  UPDATE EXTREMITIES (comp socks? Edema?)   ASSESSMENT/PLAN:  A1c TSH, c-met, lipids  DEXA next year Colonoscopy next  year  Ensure getting routine eye exams mammo due next month  Shingrix rec  (at least 2 weeks after COVID vaccine)  2nd COVID vaccine as scheduled in 06/2019  Discussed monthly self breast exams and yearly mammograms; at least 30 minutes of aerobic activity at least 5 days/week, weight-bearing exercise at least 2x/week; proper sunscreen use reviewed; healthy diet, including goals of calcium and vitamin D intake and alcohol recommendations (less than or equal to 1 drink/day) reviewed; regular seatbelt use; changing batteries in smoke detectors. Immunization recommendations discussed--continue yearly high dose flu shots. Shingrix recommended.Second COVID vaccine as scheduled (wait at least 2 weeks after vaccine before starting Shingrix).  Colonoscopy recommendations reviewed, UTD, due again next year. DEXA f/u due next year.   Medicare Attestation I have personally reviewed: The patient's medical and social history Their use of alcohol, tobacco or illicit drugs Their current medications and supplements The patient's functional ability including ADLs,fall risks, home safety risks, cognitive, and hearing and visual impairment Diet and physical activities Evidence for depression or mood disorders  The patient's weight, height, BMI have been recorded in the chart.  I have made referrals, counseling, and provided education to the patient based on review of the above and I have provided the patient with a written personalized care plan for preventive services.

## 2019-05-30 ENCOUNTER — Ambulatory Visit: Payer: Medicare Other | Admitting: Family Medicine

## 2019-06-02 ENCOUNTER — Encounter: Payer: Self-pay | Admitting: Orthopaedic Surgery

## 2019-06-02 ENCOUNTER — Other Ambulatory Visit: Payer: Self-pay

## 2019-06-02 ENCOUNTER — Ambulatory Visit (INDEPENDENT_AMBULATORY_CARE_PROVIDER_SITE_OTHER): Payer: Medicare Other | Admitting: Orthopaedic Surgery

## 2019-06-02 ENCOUNTER — Telehealth: Payer: Self-pay | Admitting: Orthopaedic Surgery

## 2019-06-02 VITALS — Ht 65.0 in | Wt 230.0 lb

## 2019-06-02 DIAGNOSIS — M1712 Unilateral primary osteoarthritis, left knee: Secondary | ICD-10-CM | POA: Diagnosis not present

## 2019-06-02 MED ORDER — HYALURONAN 30 MG/2ML IX SOSY
30.0000 mg | PREFILLED_SYRINGE | INTRA_ARTICULAR | Status: AC | PRN
  Administered 2019-06-02: 30 mg via INTRA_ARTICULAR

## 2019-06-02 NOTE — Telephone Encounter (Signed)
Pt called in said she was just here for an appt with dr.whitfield and he filed out an application for handicap placard for the patient, but he forgot to mark if her condition was going to be temporary or permanent? Pt just wants to know which one to mark she doesn't want to have to drive all the way to our office again.   [phone]

## 2019-06-02 NOTE — Progress Notes (Signed)
Office Visit Note   Patient: Katie Kaiser           Date of Birth: 1945/10/22           MRN: [id number] Visit Date: 06/02/2019              Requested by: Rita Ohara, [address] [place],  [place] [address] PCP: Rita Ohara, MD   Assessment & Plan: Visit Diagnoses:  1. Unilateral primary osteoarthritis, left knee     Plan: Second Orthovisc injection left knee.  No problem with the first injection.  Return next week to complete the series of 3  Follow-Up Instructions: Return in about 1 week (around 06/09/2019).   Orders:  Orders Placed This Encounter  Procedures  . Large Joint Inj: L knee   No orders of the defined types were placed in this encounter.     Procedures: Large Joint Inj: L knee on 06/02/2019 1:49 PM Indications: pain and joint swelling Details: 25 G 1.5 in needle, anteromedial approach  Arthrogram: No  Medications: 30 mg Hyaluronan 30 MG/2ML Outcome: tolerated well, no immediate complications Procedure, treatment alternatives, risks and benefits explained, specific risks discussed. Consent was given by the patient. Immediately prior to procedure a time out was called to verify the correct patient, procedure, equipment, support staff and site/side marked as required. Patient was prepped and draped in the usual sterile fashion.       Clinical Data: No additional findings.   Subjective: Chief Complaint  Patient presents with  . Left Knee - Follow-up    Orthovisc started 05/25/19  Patient presents today for the second injection of Orthovisc in her left knee. She started the series on 05/25/19. Patient states that her knee is more sore since the first injection, but wants to move forward with the second injection.  HPI  Review of Systems   Objective: Vital Signs: Ht 5\' 5"  (1.651 m)   Wt 230 lb (104.3 kg)   LMP 05/05/1990   BMI 38.27 kg/m   Physical Exam  Ortho Exam large knees.  Left knee was not hot red warm or swollen.  Very  mild medial joint pain full extension and flexion over 100 degrees without instability  Specialty Comments:  No specialty comments available.  Imaging: No results found.   PMFS History: Patient Active Problem List   Diagnosis Date Noted  . Bilateral primary osteoarthritis of knee 04/14/2019  . Pneumonia   . Obesity   . OAB (overactive bladder)   . Kidney stone   . Hypothyroid   . Heart murmur   . GERD (gastroesophageal reflux disease)   . Depression   . Diverticulosis   . Arthritis   . Aortic stenosis, mild 02/18/2017  . Unilateral primary osteoarthritis, left knee 04/02/2016  . Primary osteoarthritis of right knee 11/27/2015  . S/P total knee replacement using cement 11/27/2015  . Overactive bladder 11/15/2015  . Essential hypertension 04/17/2015  . Atherosclerosis of both carotid arteries 03/14/2015  . Advance care planning 08/21/2014  . Vitamin D deficiency 06/08/2013  . Depression, major, in remission ([place]) 06/08/2013  . Obesity, Class III, BMI 40-49.9 (morbid obesity) ([place]) 02/04/2011  . Hypothyroidism 10/21/2010  . Impaired fasting glucose 09/02/2005   Past Medical History:  Diagnosis Date  . Arthritis   . Depression   . Diverticulosis   . Essential hypertension   . GERD (gastroesophageal reflux disease)   . Heart murmur    slight murmur per pt  . Hypothyroid   .  Impaired fasting glucose 5/07  . Kidney stone   . OAB (overactive bladder)   . Obesity   . Pneumonia   . Vitamin D deficiency     Family History  Problem Relation Age of Onset  . Parkinsonism Mother   . Diabetes Brother   . Heart disease Brother        dx'd in 27's  . HIV Brother   . Arthritis Sister        rheumatoid  . Arthritis Sister        rheumatoid  . Bipolar disorder Sister   . Cancer Neg Hx   . Colon cancer Neg Hx   . Stomach cancer Neg Hx     Past Surgical History:  Procedure Laterality Date  . APPENDECTOMY    . CESAREAN SECTION    . COLONOSCOPY  02/04/2011    diverticulosis  . ORIF FOREARM FRACTURE  2002  . THYROIDECTOMY  1975  . TONSILLECTOMY    . TOTAL KNEE ARTHROPLASTY Right 11/27/2015   Procedure: TOTAL KNEE ARTHROPLASTY;  Surgeon: Garald Balding, MD;  Location: [facility];  Service: Orthopedics;  Laterality: Right;   Social History   Occupational History  . Occupation: hairdresser    Fish farm manager: [employer]  Tobacco Use  . Smoking status: Former Smoker    Quit date: 05/05/1978    Years since quitting: 41.1  . Smokeless tobacco: Never Used  Substance and Sexual Activity  . Alcohol use: Yes    Alcohol/week: 0.0 standard drinks    Comment: a glass of wine or beer once or twice a month or less  . Drug use: No  . Sexual activity: Not Currently

## 2019-06-02 NOTE — Telephone Encounter (Signed)
Spoke with patient and advised her to check the temporary box and 6 months.

## 2019-06-08 ENCOUNTER — Other Ambulatory Visit: Payer: Self-pay

## 2019-06-08 ENCOUNTER — Encounter: Payer: Self-pay | Admitting: Orthopaedic Surgery

## 2019-06-08 ENCOUNTER — Ambulatory Visit (INDEPENDENT_AMBULATORY_CARE_PROVIDER_SITE_OTHER): Payer: Medicare Other | Admitting: Orthopaedic Surgery

## 2019-06-08 VITALS — Ht 65.0 in | Wt 230.0 lb

## 2019-06-08 DIAGNOSIS — M1712 Unilateral primary osteoarthritis, left knee: Secondary | ICD-10-CM | POA: Diagnosis not present

## 2019-06-08 DIAGNOSIS — M170 Bilateral primary osteoarthritis of knee: Secondary | ICD-10-CM

## 2019-06-08 MED ORDER — HYALURONAN 30 MG/2ML IX SOSY
30.0000 mg | PREFILLED_SYRINGE | INTRA_ARTICULAR | Status: AC | PRN
  Administered 2019-06-08: 30 mg via INTRA_ARTICULAR

## 2019-06-08 NOTE — Progress Notes (Signed)
Office Visit Note   Patient: Katie Kaiser           Date of Birth: 1945-12-25           MRN: [id number] Visit Date: 06/08/2019              Requested by: Rita Ohara, [address] [place],  [place] [address] PCP: Rita Ohara, MD   Assessment & Plan: Visit Diagnoses:  1. Bilateral primary osteoarthritis of knee     Plan: Third and final Orthovisc injection left knee.  Return as needed  Follow-Up Instructions: Return if symptoms worsen or fail to improve.   Orders:  Orders Placed This Encounter  Procedures  . Large Joint Inj: R knee   No orders of the defined types were placed in this encounter.     Procedures: Large Joint Inj: R knee on 06/08/2019 11:02 AM Indications: pain and joint swelling Details: 25 G 1.5 in needle  Arthrogram: No  Medications: 30 mg Hyaluronan 30 MG/2ML Outcome: tolerated well, no immediate complications Procedure, treatment alternatives, risks and benefits explained, specific risks discussed. Consent was given by the patient. Immediately prior to procedure a time out was called to verify the correct patient, procedure, equipment, support staff and site/side marked as required. Patient was prepped and draped in the usual sterile fashion.       Clinical Data: No additional findings.   Subjective: Chief Complaint  Patient presents with  . Left Knee - Follow-up    Orthovisc started 05/25/2019  Patient presents today for her third and final injection of Orthovisc in her left knee. She started the series on 05/25/2019. She has already noticed some improvement in her knee.   HPI  Review of Systems   Objective: Vital Signs: Ht 5\' 5"  (1.651 m)   Wt 230 lb (104.3 kg)   LMP 05/05/1990   BMI 38.27 kg/m   Physical Exam  Ortho Exam large knees.  Left knee was not hot warm red or swollen.  Specialty Comments:  No specialty comments available.  Imaging: No results found.   PMFS History: Patient Active Problem List   Diagnosis Date Noted  . Bilateral primary osteoarthritis of knee 04/14/2019  . Pneumonia   . Obesity   . OAB (overactive bladder)   . Kidney stone   . Hypothyroid   . Heart murmur   . GERD (gastroesophageal reflux disease)   . Depression   . Diverticulosis   . Arthritis   . Aortic stenosis, mild 02/18/2017  . Unilateral primary osteoarthritis, left knee 04/02/2016  . Primary osteoarthritis of right knee 11/27/2015  . S/P total knee replacement using cement 11/27/2015  . Overactive bladder 11/15/2015  . Essential hypertension 04/17/2015  . Atherosclerosis of both carotid arteries 03/14/2015  . Advance care planning 08/21/2014  . Vitamin D deficiency 06/08/2013  . Depression, major, in remission ([place]) 06/08/2013  . Obesity, Class III, BMI 40-49.9 (morbid obesity) ([place]) 02/04/2011  . Hypothyroidism 10/21/2010  . Impaired fasting glucose 09/02/2005   Past Medical History:  Diagnosis Date  . Arthritis   . Depression   . Diverticulosis   . Essential hypertension   . GERD (gastroesophageal reflux disease)   . Heart murmur    slight murmur per pt  . Hypothyroid   . Impaired fasting glucose 5/07  . Kidney stone   . OAB (overactive bladder)   . Obesity   . Pneumonia   . Vitamin D deficiency     Family History  Problem  Relation Age of Onset  . Parkinsonism Mother   . Diabetes Brother   . Heart disease Brother        dx'd in 71's  . HIV Brother   . Arthritis Sister        rheumatoid  . Arthritis Sister        rheumatoid  . Bipolar disorder Sister   . Cancer Neg Hx   . Colon cancer Neg Hx   . Stomach cancer Neg Hx     Past Surgical History:  Procedure Laterality Date  . APPENDECTOMY    . CESAREAN SECTION    . COLONOSCOPY  02/04/2011   diverticulosis  . ORIF FOREARM FRACTURE  2002  . THYROIDECTOMY  1975  . TONSILLECTOMY    . TOTAL KNEE ARTHROPLASTY Right 11/27/2015   Procedure: TOTAL KNEE ARTHROPLASTY;  Surgeon: Garald Balding, MD;  Location: [facility];  Service:  Orthopedics;  Laterality: Right;   Social History   Occupational History  . Occupation: hairdresser    Fish farm manager: [employer]  Tobacco Use  . Smoking status: Former Smoker    Quit date: 05/05/1978    Years since quitting: 41.1  . Smokeless tobacco: Never Used  Substance and Sexual Activity  . Alcohol use: Yes    Alcohol/week: 0.0 standard drinks    Comment: a glass of wine or beer once or twice a month or less  . Drug use: No  . Sexual activity: Not Currently

## 2019-06-09 ENCOUNTER — Other Ambulatory Visit: Payer: Self-pay | Admitting: Family Medicine

## 2019-06-09 DIAGNOSIS — I10 Essential (primary) hypertension: Secondary | ICD-10-CM

## 2019-06-12 ENCOUNTER — Other Ambulatory Visit: Payer: Self-pay | Admitting: Family Medicine

## 2019-06-12 DIAGNOSIS — F325 Major depressive disorder, single episode, in full remission: Secondary | ICD-10-CM

## 2019-06-13 ENCOUNTER — Ambulatory Visit: Payer: Self-pay

## 2019-06-13 NOTE — Telephone Encounter (Signed)
Would you like me to send for #30 or #90?

## 2019-06-13 NOTE — Telephone Encounter (Signed)
Attempted to contact patient She wants to know how long after 2nd immunization for COVID-19 does it take to be fully protected. Left VM to return call

## 2019-06-13 NOTE — Telephone Encounter (Signed)
#  30.  Maybe she will be less likely to cancel again.

## 2019-06-13 NOTE — Telephone Encounter (Signed)
Attempted to call patient 3 times and no answer. Left message to return the call to [place] or call her provider's office tomorrow, when they are opened.

## 2019-06-16 ENCOUNTER — Ambulatory Visit: Payer: Medicare Other | Attending: Internal Medicine

## 2019-06-16 DIAGNOSIS — Z23 Encounter for immunization: Secondary | ICD-10-CM

## 2019-06-16 NOTE — Progress Notes (Signed)
   Covid-19 Vaccination Clinic  Name:  Katie Kaiser    MRN: [id number] DOB: 1946-03-06  06/16/2019  [name] was observed post Covid-19 immunization for 15 minutes without incidence. She was provided with Vaccine Information Sheet and instruction to access the V-Safe system.   [name] was instructed to call 911 with any severe reactions post vaccine: Marland Kitchen Difficulty breathing  . Swelling of your face and throat  . A fast heartbeat  . A bad rash all over your body  . Dizziness and weakness    Immunizations Administered    Name Date Dose VIS Date Route   Pfizer COVID-19 Vaccine 06/16/2019  3:31 PM 0.3 mL 04/15/2019 Intramuscular   Manufacturer: [employer], [employer]   Lot: [id number]   [place]: [id number]

## 2019-06-20 ENCOUNTER — Ambulatory Visit: Payer: Medicare Other | Admitting: Family Medicine

## 2019-07-06 NOTE — Patient Instructions (Addendum)
HEALTH MAINTENANCE RECOMMENDATIONS:  It is recommended that you get at least 30 minutes of aerobic exercise at least 5 days/week (for weight loss, you may need as much as 60-90 minutes). This can be any activity that gets your heart rate up. This can be divided in 10-15 minute intervals if needed, but try and build up your endurance at least once a week.  Weight bearing exercise is also recommended twice weekly.  Eat a healthy diet with lots of vegetables, fruits and fiber.  "Colorful" foods have a lot of vitamins (ie green vegetables, tomatoes, red peppers, etc).  Limit sweet tea, regular sodas and alcoholic beverages, all of which has a lot of calories and sugar.  Up to 1 alcoholic drink daily may be beneficial for women (unless trying to lose weight, watch sugars).  Drink a lot of water.  Calcium recommendations are 1200-1500 mg daily (1500 mg for postmenopausal women or women without ovaries), and vitamin D 1000 IU daily.  This should be obtained from diet and/or supplements (vitamins), and calcium should not be taken all at once, but in divided doses.  Monthly self breast exams and yearly mammograms for women over the age of 56 is recommended.  Sunscreen of at least SPF 30 should be used on all sun-exposed parts of the skin when outside between the hours of 10 am and 4 pm (not just when at beach or pool, but even with exercise, golf, tennis, and yard work!)  Use a sunscreen that says "broad spectrum" so it covers both UVA and UVB rays, and make sure to reapply every 1-2 hours.  Remember to change the batteries in your smoke detectors when changing your clock times in the spring and fall. Carbon monoxide detectors are recommended for your home.  Use your seat belt every time you are in a car, and please drive safely and not be distracted with cell phones and texting while driving.   Ms. Baumel , Thank you for taking time to come for your Medicare Wellness Visit. I appreciate your ongoing  commitment to your health goals. Please review the following plan we discussed and let me know if I can assist you in the future.    This is a list of the screening recommended for you and due dates:  Health Maintenance  Topic Date Due  . Mammogram  06/10/2019  . Colon Cancer Screening  02/03/2021  . Tetanus Vaccine  03/17/2023  . Flu Shot  Completed  . DEXA scan (bone density measurement)  Completed  .  Hepatitis C: One time screening is recommended by Center for Disease Control  (CDC) for  adults born from [birth year] through 1963.   Completed  . Pneumonia vaccines  Completed   You are due for your mammogram.  They recommend waiting 4-6 weeks after your second COVID vaccine before getting a mammogram.  Your bone density test is due to be repeated again next year (2 years from the last one).  I recommend getting the new shingles vaccine (Shingrix). You will need to get this from the pharmacy rather than our office, as it is covered by Medicare Part D (if you don't have part D, then the least expensive place to pay out of pocket for it is at [employer]).  It is a series of 2 injections, spaced 2 months apart.  Please schedule your routine dental cleaning, and also schedule an eye exam.  Please fill out the living will and healthcare power of attorney forms.  Get  [place] a copy at your convenience after they are notarized, in order get them scanned into your medical chart.  Your morning blood pressures are high--surprising since you do separate your blood pressure medications.  This could be a sign of sleep apnea (also because of the loud snoring). If your morning blood pressures stay high, we should consider doing a sleep study, and/or adjust your medications. Try and avoid sleeping on your back.  Continue to work on weight loss, and get regular exercise and follow a low sodium diet.  We are referring to the neurologist to further assess your tremor.  We will have you re-try the oxybutynin.  Start at 1/2-1 tablet 2 times daily, and titrate up until you get desired results without too much dry mouth or constipation.  Max dose would be 1 full pill three times daily, but use the lowest effective dose. Others that are long-acting that might be more expensive (to consider if this doesn't work, or if it does but has more side effects) would be Detrol, Theme park manager, [employer]  There is an extended release oxybutynin as well (taken just once daily).

## 2019-07-06 NOTE — Progress Notes (Signed)
Chief Complaint  Patient presents with  . Medicare Wellness    fasting AWV with pelvic (patient said she really would rather not). She is considering getting her bladder fixed.     Katie Kaiser is a 74 y.o. female who presents for annual wellness visit and follow-up on chronic medical conditions.    "I am tired of dripping"--asking for referral to urologist to consider surgery. She has h/o overactive bladder. In 03/2018 she reportedleakage of urine, and frequency, getting up 2-3 times at night. Leakage with urgency as well as with cough/sneeze.  She takes her diuretic in the morning.  Given trial of oxybutynin in 05/2018, didn't think it helped, and nighttime symptoms improved when more careful with the timing of her liquid intake. She continues to avoid drinking late at night, gets up only once to void, and falls back to sleep. She is having more leakage during the day, having to wear a pad, and it is "horrible" and she is sick of it.  She had a friend who had surgery, and wants a referral to a urologist. She tried just the oxybutynin, no other meds, and thinks she only took it once daily.  She pays OOP for medications.  Her son worries that she could have Parkinson's disease--her mother and sister both did.  She has h/o tremor in the LUE ever since injury/surgery.  She states that Dr. Park Liter felt like it was from the nerve injury.  She would like to know if there is any treatment for the tremor, and if it is PD.  She saw Dr. Durward Fortes in January/February for Orthovisc injections into the L knee x 3. It is a lot better than it was. She got R sided cervical facet injection from Dr. Ernestina Patches on 05/23/19. This helped tremendously, and her neck is doing much better. She had seen Dr. Junius Roads, who ordered MRI, done 05/22/19: IMPRESSION: 1. Multilevel cervical spondylosis with resultant mild to moderate diffuse spinal stenosis at C3-4 through C5-6. 2. Multifactorial degenerative changes with resultant  multilevel foraminal narrowing as above. Notable findings include mild right C4 and bilateral C5 foraminal stenosis, moderate right with mild left C6 foraminal narrowing, with mild right C8 foraminal stenosis. 3. Mild reactive marrow edema about the left C6-7 facet due to facet arthritis. Finding could serve as a source for neck pain.  Depression: In 1/2020Abilify 51m was added to her regimen of fluoxetine 467mand wellbutrin 30083mue to depression not being well controlled. PHQ-9 score was 10 at that time(prior to adding Abilify). She was also given names of psychiatrists to consult with if not improving, to have medication regimen adjusted. Sheinitially felt like it was helping (PHQ-9 score of 2 a week later), but contacted us [place] 06/2018 reportingtremor in left leg and foot, felt easilyagitated and more depressed, and she stopped the medication. She wasn't able to get in with a psychiatrist.  She continued on Prozac and Wellbutrin, and had been doing much better. A lot of that is related to not working (related to pandemic). Last PHQ-9 score was 0 in 11/2018.  She did feel like her depression worsened since then, but improved again after getting to see her grandchildren. She currently reports she is still doing very well on the Wellbutrin and Prozac. [place]. They only recently moved back into the house (after repairs related to asbestos), so she will wait until June or July.  Hypothyroidism: She continues on Synthroid 175m8m  taking on an empty stomach, separate from other medications. She denies changes to energy/hair/skin/bowels/weight.Some hair thinning as she has gotten older, no loss. Lab Results  Component Value Date   TSH 1.530 11/22/2018   Hypertension:She reports compliance with her medications, denies side effects other thansomeurinary frequency.She is under the care of cardiology. Currently is taking lisinopril 65m at  night and HCTZ 251min the morning. She reports that her BP is up to 150's/70's when she first wakes up. She rechecks it later in the afternoon, and it is 130's/70. She denies headaches, dizziness, syncope, chest pain, palpitations, DOE. When asked about sleep apnea, she indicated that we have suggested testing in the past (but she didn't want).  +snoring.  Feels refreshed in the morning, no daytime somnolence. Denies headaches (just once when she was away and slept with a different pillow--neck pain and HA).  Aortic stenosis--mild per echo 02/2017; echo was repeated 03/2018(after syncope), no change. Asymptomatic from this, no further dizziness/syncope. Denies angina or dyspnea.  H/o impaired fasting glucose:  She tries to limit sweets and carbs.   Hasn't exercised much in the last 4 months--related to depression (also knee pain). Lab Results  Component Value Date   HGBA1C 6.1 (H) 11/22/2018   GERD:  Takes OTC Nexium daily with good results . Has recurrent heartburn if she doesn't take it.  Immunization History  Administered Date(s) Administered  . Fluad Quad(high Dose 65+) 01/24/2019  . Influenza Split 02/01/2012, 01/03/2013, 03/03/2014, 03/03/2014  . Influenza Whole 03/16/2011  . Influenza, High Dose Seasonal PF 03/14/2015, 03/19/2016, 02/12/2017, 03/17/2018  . PFIZER SARS-COV-2 Vaccination 05/26/2019, 06/16/2019  . Pneumococcal Conjugate-13 06/08/2013  . Pneumococcal Polysaccharide-23 03/03/2014  . Td 10/09/1997  . Tdap 08/02/2007, 03/16/2013  . Zoster 03/17/2011   Last Pap smear:02/2017 normal, no high risk HPV present [facility]; plans to r/s (needs to wait due to recent COVID vaccine) Last colonoscopy: 10/2012Dr. GeCarlean Purldiverticulosis [facility] R [facility] neck. Dentist: twice yearly, but past [place] Exercise:nothing recently; plans to  restart her recumbent bike.  Lipids: Lab Results  Component Value Date   CHOL 173 10/18/2015   HDL 56 10/18/2015   LDLCALC 105 10/18/2015   TRIG 58 10/18/2015   CHOLHDL 3.1 10/18/2015   Vitamin D screen normal at 52 in 10/2015  Other doctors caring for patient include: Ophtho:[facility]. [facility]Nicki Reapert [facility]ist:[facility] Dermatologist: Dr. LuPati GalloGI: Dr. GeCarlean Purlrtho: PiLenard Simmerrtho (sees Dr. WhRudene AndaA, BrBiagio Borgand Dr. HiJunius RoadsCardiologist: Dr. MeEulas PostDr. VaIrish LackDepression screen: negative Fall screen:None in the last year Function Status screen: notableonlyfor leakage of urine Mini-Cog screen:Normal See full screens in Epic.  End of Life Discussion:She does not have living will or healthcare power of attorney. New forms were given.    PMH, PSH, SH and FH reviewed  Outpatient Encounter Medications as of 07/07/2019  Medication Sig  . aspirin EC 81 MG tablet Take 81 mg by mouth daily.  . Marland KitchenuPROPion (WELLBUTRIN XL) 300 MG 24 hr tablet TAKE ONE TABLET BY MOUTH DAILY  . Cholecalciferol (VITAMIN D) 1000 UNITS capsule Take 1,000 Units by mouth daily.    . diclofenac Sodium (VOLTAREN) 1 % GEL APPLY 2 TO 4 GRAMS TO AFFECTED EXTERNAL AREA FOUR TIMES A DAY  . esomeprazole (NEXIUM) 20 MG capsule Take 20 mg by mouth daily at 12 noon.  .Marland Kitchen  FLUoxetine (PROZAC) 40 MG capsule Take 1 capsule by mouth once daily  . hydrochlorothiazide (HYDRODIURIL) 25 MG tablet Take 1 tablet by mouth once daily  . lisinopril (ZESTRIL) 20 MG tablet TAKE 1 TABLET BY MOUTH EVERY EVENING  . Omega-3 Fatty Acids (FISH OIL) 1000 MG CAPS Take 2 mg daily by mouth.  . SYNTHROID 175 MCG tablet Take 1 tablet (175 mcg total) by mouth daily before breakfast.  . [DISCONTINUED] HYDROcodone-acetaminophen (NORCO/VICODIN) 5-325 MG tablet Take 1 tablet by mouth 2 (two) times daily as needed for moderate pain.  . [DISCONTINUED] metaxalone (SKELAXIN) 800 MG tablet Take 0.5-1  tablets (400-800 mg total) by mouth 3 (three) times daily as needed for muscle spasms. (Patient not taking: Reported on 07/07/2019)  . [DISCONTINUED] methocarbamol (ROBAXIN) 500 MG tablet Take 1 tablet (500 mg total) by mouth 4 (four) times daily.  . [DISCONTINUED] methylPREDNISolone (MEDROL DOSEPAK) 4 MG TBPK tablet Take as directed   No facility-administered encounter medications on file as of 07/07/2019.   Allergies  Allergen Reactions  . Dilaudid [Hydromorphone Hcl]     Lots of itching     ROS: The patient denies anorexia, fever, headaches, vision changes, decreased hearing, ear pain, sore throat, breast concerns, chest pain, palpitations, dizziness, syncope, dyspnea on exertion, cough, swelling, nausea, vomiting, diarrhea, constipation, melena, hematochezia, indigestion/heartburn, hematuria, dysuria, vaginal bleeding, discharge, odor or itch, genital lesions, numbness, tingling, weakness, tremor, suspicious skin lesions, abnormal bleeding/bruising, or enlarged lymph nodes. +urinary incontinence (cough/sneeze/urgency) Tremor (and numbness) in left arm/hand since injury/surgery, unchanged.  Her son thinks it is worse, she doesn't think there is much difference. Neck pain resolved with the injection Left knee pain--s/p injections, and much better Depression is much better since she saw her grandchildren and got vaccinated R ear sometimes feels like it plugs up periodically.    PHYSICAL EXAM:  BP 140/72   Pulse 72   Temp 98.4 F (36.9 C) (Other (Comment))   Ht 5' 3.75" (1.619 m)   Wt 248 lb 9.6 oz (112.8 kg)   LMP 05/05/1990   BMI 43.01 kg/m   Wt Readings from Last 3 Encounters:  07/07/19 248 lb 9.6 oz (112.8 kg)  06/08/19 230 lb (104.3 kg)  06/02/19 230 lb (104.3 kg)   Wt 251# 12.8 oz in our office 11/19 (other weights above were form Dr. Rudene Anda notes)  General Appearance:   Alert, cooperative, no distress, appears stated age  Head:   Normocephalic, without  obvious abnormality, atraumatic  Eyes:   PERRL, conjunctiva/corneas clear, EOM's intact, fundi benign  Ears:   Normal TM's and external ear canals.  Nose: Not examined, wearing mask due to COVID-19 pandemic  Throat:  Not examined, wearing mask due to COVID-19 pandemic  Neck: Supple, no lymphadenopathy; thyroid: no enlargement/ tenderness/nodules; no carotid bruit or JVD  Back:  Spine nontender, no curvature, ROM normal, no CVA tenderness  Lungs:   Clear to auscultation bilaterally without wheezes, rales or ronchi; respirations unlabored  Chest Wall:   No tenderness or deformity  Heart:   Regular rate and rhythm, S1 and S2 normal, no rub or gallop. 3/6 SEM heard loudest at RUSB  Breast Exam:   No tenderness, masses, or nipple discharge or inversion. No axillary lymphadenopathy  Abdomen:   Soft, non-tender, nondistended, normoactive bowel sounds, no masses, no hepatosplenomegaly  Genitalia:   Normal external genitalia without lesions; atrophic changes noted. BUS and vagina normal; no cervical motion tenderness. No abnormal vaginal discharge. Uterus and adnexa not enlarged, nontender, no masses,  though exam is limited due to body habitus. No prolapse or significant cystocele noted. Papnotperformed  Rectal:   Normal tone, no masses or tenderness; guaiac negative stool  Extremities:  No clubbing, cyanosis. Wearing compression socks, and trace pretibial edema noted through socks. Varicose veins noted left medial thigh, nontender  Pulses:  2+ and symmetric all extremities  Skin:  Skin color, texture, turgor normal, no rashes or lesions. She does have some purpura on forearms and hands, as well as some healing bruises (abdomen, legs).  Lymph nodes:  Cervical, supraclavicular, and axillary nodes normal  Neurologic:  Normal strength, sensation and gait; reflexes 2+ and symmetric throughout. Resting, almost pill-rolling  tremor on the left, with arm in certain positions.  Some very mild intention tremor bilaterally (when touching index fingers together).    Psych: Normal mood, affect, hygiene and grooming   Lab Results  Component Value Date   HGBA1C 5.7 (A) 07/07/2019    ASSESSMENT/PLAN:  Medicare annual wellness visit, subsequent  Impaired fasting glucose - improved; encouraged lowfat, low carb diet, resume daily exercise, weight loss - Plan: HgB A1c, Comprehensive metabolic panel  Essential hypertension - borderline today, elevated at home in mornings.  Sees cardiologist. Some concern for sleep apnea, declines sleep study. Will further monitor BP - Plan: Comprehensive metabolic panel, Lipid panel  Mild aortic stenosis - asymptomatic  Recurrent major depressive disorder, in full remission ([place]) - Currently in remission, though only recent.  Cont wellbutrin and Prozac - Plan: buPROPion (WELLBUTRIN XL) 300 MG 24 hr tablet, FLUoxetine (PROZAC) 40 MG capsule  Hypothyroidism, unspecified type - euthyroid by history. Cont synthroid - Plan: TSH  Tremor - per pt request, will refer to neuro for further eval.  Does have some pill-rolling quality, no other PD sx, and know nerve injury/trauma. - Plan: Ambulatory referral to Neurology  Overactive bladder - unsure if she had good trial of ditropan in past. Not indicated for urology/surgical eval without other med trials. Limited by cost (no med coverage) - Plan: oxybutynin (DITROPAN) 5 MG tablet   A1c TSH, c-met, lipids  DEXA next year Colonoscopy next year Encouraged to schedule mammo (4-6 wks after last COVID vaccine), dental visits and routine eye exam.  F/u 6 mos  Discussed monthly self breast exams and yearly mammograms; at least 30 minutes of aerobic activity at least 5 days/week, weight-bearing exercise at least 2x/week; proper sunscreen use reviewed; healthy diet, including goals of calcium and vitamin D intake and alcohol  recommendations (less than or equal to 1 drink/day) reviewed; regular seatbelt use; changing batteries in smoke detectors. Immunization recommendations discussed--continue yearlyhigh dose flu shots.Shingrix recommended.  Colonoscopy recommendations reviewed, UTD, due again next year. DEXA f/u due next year.   Your morning blood pressures are high--surprising since you do separate your blood pressure medications.  This could be a sign of sleep apnea (also because of the loud snoring). If your morning blood pressures stay high, we should consider doing a sleep study, and/or adjust your medications. Try and avoid sleeping on your back.  Continue to work on weight loss, and get regular exercise and follow a low sodium diet.  We are referring to the neurologist to further assess your tremor.  We will have you re-try the oxybutynin. Start at 1/2-1 tablet 2 times daily, and titrate up until you get desired results without too much dry mouth or constipation.  Max dose would be 1 full pill three times daily, but use the lowest effective dose. Others that are long-acting  that might be more expensive (to consider if this doesn't work, or if it does but has more side effects) would be Detrol, Theme park manager, [employer].  There is an extended release oxybutynin as well (taken just once daily).   Medicare Attestation I have personally reviewed: The patient's medical and social history Their use of alcohol, tobacco or illicit drugs Their current medications and supplements The patient's functional ability including ADLs,fall risks, home safety risks, cognitive, and hearing and visual impairment Diet and physical activities Evidence for depression or mood disorders  The patient's weight, height, BMI have been recorded in the chart.  I have made referrals, counseling, and provided education to the patient based on review of the above and I have provided the patient with a written personalized care plan for  preventive services.

## 2019-07-07 ENCOUNTER — Other Ambulatory Visit: Payer: Self-pay

## 2019-07-07 ENCOUNTER — Ambulatory Visit (INDEPENDENT_AMBULATORY_CARE_PROVIDER_SITE_OTHER): Payer: Medicare Other | Admitting: Family Medicine

## 2019-07-07 ENCOUNTER — Encounter: Payer: Self-pay | Admitting: Family Medicine

## 2019-07-07 VITALS — BP 140/72 | HR 72 | Temp 98.4°F | Ht 63.75 in | Wt 248.6 lb

## 2019-07-07 DIAGNOSIS — I10 Essential (primary) hypertension: Secondary | ICD-10-CM

## 2019-07-07 DIAGNOSIS — R7301 Impaired fasting glucose: Secondary | ICD-10-CM

## 2019-07-07 DIAGNOSIS — R251 Tremor, unspecified: Secondary | ICD-10-CM | POA: Diagnosis not present

## 2019-07-07 DIAGNOSIS — N3281 Overactive bladder: Secondary | ICD-10-CM | POA: Diagnosis not present

## 2019-07-07 DIAGNOSIS — I350 Nonrheumatic aortic (valve) stenosis: Secondary | ICD-10-CM | POA: Diagnosis not present

## 2019-07-07 DIAGNOSIS — E039 Hypothyroidism, unspecified: Secondary | ICD-10-CM

## 2019-07-07 DIAGNOSIS — F3342 Major depressive disorder, recurrent, in full remission: Secondary | ICD-10-CM | POA: Diagnosis not present

## 2019-07-07 DIAGNOSIS — Z0000 Encounter for general adult medical examination without abnormal findings: Secondary | ICD-10-CM | POA: Diagnosis not present

## 2019-07-07 LAB — POCT GLYCOSYLATED HEMOGLOBIN (HGB A1C): Hemoglobin A1C: 5.7 % — AB (ref 4.0–5.6)

## 2019-07-07 MED ORDER — OXYBUTYNIN CHLORIDE 5 MG PO TABS: 2.5000 mg | ORAL_TABLET | Freq: Three times a day (TID) | ORAL | 5 refills | Status: DC

## 2019-07-07 MED ORDER — FLUOXETINE HCL 40 MG PO CAPS: 40.0000 mg | ORAL_CAPSULE | Freq: Every day | ORAL | 1 refills | Status: DC

## 2019-07-07 MED ORDER — BUPROPION HCL ER (XL) 300 MG PO TB24: 300.0000 mg | ORAL_TABLET | Freq: Every day | ORAL | 1 refills | Status: DC

## 2019-07-08 ENCOUNTER — Encounter: Payer: Self-pay | Admitting: Neurology

## 2019-07-08 LAB — COMPREHENSIVE METABOLIC PANEL
ALT: 15 IU/L (ref 0–32)
AST: 16 IU/L (ref 0–40)
Albumin/Globulin Ratio: 1.6 (ref 1.2–2.2)
Albumin: 4.3 g/dL (ref 3.7–4.7)
Alkaline Phosphatase: 107 IU/L (ref 39–117)
BUN/Creatinine Ratio: 17 (ref 12–28)
BUN: 12 mg/dL (ref 8–27)
Bilirubin Total: 0.3 mg/dL (ref 0.0–1.2)
CO2: 25 mmol/L (ref 20–29)
Calcium: 9.3 mg/dL (ref 8.7–10.3)
Chloride: 99 mmol/L (ref 96–106)
Creatinine, Ser: 0.69 mg/dL (ref 0.57–1.00)
GFR calc Af Amer: 101 mL/min/{1.73_m2} (ref >59)
GFR calc non Af Amer: 88 mL/min/{1.73_m2} (ref >59)
Globulin, Total: 2.7 g/dL (ref 1.5–4.5)
Glucose: 89 mg/dL (ref 65–99)
Potassium: 4.3 mmol/L (ref 3.5–5.2)
Sodium: 138 mmol/L (ref 134–144)
Total Protein: 7 g/dL (ref 6.0–8.5)

## 2019-07-08 LAB — LIPID PANEL
Chol/HDL Ratio: 3.2 ratio (ref 0.0–4.4)
Cholesterol, Total: 168 mg/dL (ref 100–199)
HDL: 52 mg/dL (ref >39)
LDL Chol Calc (NIH): 100 mg/dL — ABNORMAL HIGH (ref 0–99)
Triglycerides: 84 mg/dL (ref 0–149)
VLDL Cholesterol Cal: 16 mg/dL (ref 5–40)

## 2019-07-08 LAB — TSH: TSH: 0.559 u[IU]/mL (ref 0.450–4.500)

## 2019-07-24 NOTE — Progress Notes (Signed)
Cardiology Office Note   Date:  07/25/2019   ID:  Katie Kaiser, DOB 1946-02-15, MRN [id number]  PCP:  Rita Ohara, MD    No chief complaint on file.  Aortic stenosis  Wt Readings from Last 3 Encounters:  07/25/19 247 lb (112 kg)  07/07/19 248 lb 9.6 oz (112.8 kg)  06/08/19 230 lb (104.3 kg)       History of Present Illness: Katie Kaiser is a 74 y.o. female  With a h/o mild aortic stenosis.    In late 2019, she has some dizziness, but this improved with better hydration.  She never fell.    In the past, she has written an exercise bike 3 times a week.  Lisinopril has adequately controlled blood pressure.  Since last visit, she has had injections in her knee for osteoarthritis.  She had her COVID vaccines Therapist, music).    Denies : Chest pain. Dizziness. Leg edema. Nitroglycerin use. Orthopnea. Palpitations. Paroxysmal nocturnal dyspnea. Shortness of breath. Syncope.   She is back to using an exercise bike.    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      Past Medical History:  Diagnosis Date  . Arthritis   . Depression   . Diverticulosis   . Essential hypertension   . GERD (gastroesophageal reflux disease)   . Heart murmur    slight murmur per pt  . Hypothyroid   . Impaired fasting glucose 5/07  . Kidney stone   . OAB (overactive bladder)   . Obesity   . Pneumonia   . Vitamin D deficiency     Past Surgical History:  Procedure Laterality Date  . APPENDECTOMY    . CESAREAN SECTION    . COLONOSCOPY  02/04/2011   diverticulosis  . ORIF FOREARM FRACTURE  2002  . THYROIDECTOMY  1975  . TONSILLECTOMY    . TOTAL KNEE ARTHROPLASTY Right 11/27/2015   Procedure: TOTAL KNEE ARTHROPLASTY;  Surgeon: Garald Balding, MD;  Location: [facility];  Service: Orthopedics;  Laterality: Right;     Current Outpatient Medications  Medication Sig Dispense Refill  . aspirin EC 81 MG tablet Take 81 mg by mouth daily.    Marland Kitchen buPROPion (WELLBUTRIN XL) 300 MG 24 hr tablet  Take 1 tablet (300 mg total) by mouth daily. 90 tablet 1  . Cholecalciferol (VITAMIN D) 1000 UNITS capsule Take 1,000 Units by mouth daily.      . diclofenac Sodium (VOLTAREN) 1 % GEL APPLY 2 TO 4 GRAMS TO AFFECTED EXTERNAL AREA FOUR TIMES A DAY 500 g 2  . esomeprazole (NEXIUM) 20 MG capsule Take 20 mg by mouth daily at 12 noon.    Marland Kitchen FLUoxetine (PROZAC) 40 MG capsule Take 1 capsule (40 mg total) by mouth daily. 90 capsule 1  . hydrochlorothiazide (HYDRODIURIL) 25 MG tablet Take 1 tablet by mouth once daily 90 tablet 0  . lisinopril (ZESTRIL) 20 MG tablet TAKE 1 TABLET BY MOUTH EVERY EVENING 30 tablet 8  . Omega-3 Fatty Acids (FISH OIL) 1000 MG CAPS Take 2 mg daily by mouth.    . oxybutynin (DITROPAN) 5 MG tablet Take 0.5-1 tablets (2.5-5 mg total) by mouth 3 (three) times daily. 90 tablet 5  . SYNTHROID 175 MCG tablet Take 1 tablet (175 mcg total) by mouth daily before breakfast. 90 tablet 3   No current facility-administered medications for this visit.    Allergies:   Dilaudid [hydromorphone hcl]    Social History:  The patient  reports  that she quit smoking about 41 years ago. She has never used smokeless tobacco. She reports previous alcohol use. She reports that she does not use drugs.   Family History:  The patient's family history includes Arthritis in her sister and sister; Bipolar disorder in her sister; Diabetes in her brother; HIV in her brother; Heart disease in her brother; Parkinsonism in her mother.    ROS:  Please see the history of present illness.   Otherwise, review of systems are positive for knee pain.   All other systems are reviewed and negative.    PHYSICAL EXAM: VS:  BP 110/60   Pulse 71   Ht 5' 3.75" (1.619 m)   Wt 247 lb (112 kg)   LMP 05/05/1990   SpO2 97%   BMI 42.73 kg/m  , BMI Body mass index is 42.73 kg/m. GEN: Well nourished, well developed, in no acute distress  HEENT: normal  Neck: no JVD, carotid bruits, or masses Cardiac: RRR; 2/6 early  systolic murmur, no rubs, or gallops,no edema  Respiratory:  clear to auscultation bilaterally, normal work of breathing GI: soft, nontender, nondistended, + BS MS: no deformity or atrophy  Skin: warm and dry, no rash Neuro:  Strength and sensation are intact Psych: euthymic mood, full affect   EKG:   The ekg ordered today demonstrates NSR, no ST segment changes   Recent Labs: 11/22/2018: Hemoglobin 12.0; Platelets 254 07/07/2019: ALT 15; BUN 12; Creatinine, Ser 0.69; Potassium 4.3; Sodium 138; TSH 0.559   Lipid Panel    Component Value Date/Time   CHOL 168 07/07/2019 1443   TRIG 84 07/07/2019 1443   HDL 52 07/07/2019 1443   CHOLHDL 3.2 07/07/2019 1443   CHOLHDL 3.1 10/18/2015 0909   VLDL 12 10/18/2015 0909   LDLCALC 100 (H) 07/07/2019 1443     Other studies Reviewed: Additional studies/ records that were reviewed today with results demonstrating: 2019 echo showed: " - Left ventricle: The cavity size was normal. There was mild  concentric hypertrophy. Systolic function was normal. The  estimated ejection fraction was in the range of 60% to 65%. Wall  motion was normal; there were no regional wall motion  abnormalities. Doppler parameters are consistent with abnormal  left ventricular relaxation (grade 1 diastolic dysfunction).  - Aortic valve: Not well visualized. There was mild stenosis.  - Left atrium: The atrium was mildly dilated. "   ASSESSMENT AND PLAN:  1. Aortic stenosis: No sx of severe AS.  Continue regular exercise.   2. Hypertension: The current medical regimen is effective;  continue present plan and medications. 3. Dizziness: Stay well hydrated.    Current medicines are reviewed at length with the patient today.  The patient concerns regarding her medicines were addressed.  The following changes have been made:  No change  Labs/ tests ordered today include:  No orders of the defined types were placed in this encounter.   Recommend 150  minutes/week of aerobic exercise Low fat, low carb, high fiber diet recommended  Disposition:   FU in 1 year   Signed, Larae Grooms, MD  07/25/2019 3:35 PM    [facility] [address], [place], [place]  [address] Phone: [phone]; Fax: [phone]

## 2019-07-25 ENCOUNTER — Ambulatory Visit (INDEPENDENT_AMBULATORY_CARE_PROVIDER_SITE_OTHER): Payer: Medicare Other | Admitting: Interventional Cardiology

## 2019-07-25 ENCOUNTER — Other Ambulatory Visit: Payer: Self-pay

## 2019-07-25 ENCOUNTER — Encounter: Payer: Self-pay | Admitting: Interventional Cardiology

## 2019-07-25 VITALS — BP 110/60 | HR 71 | Ht 63.75 in | Wt 247.0 lb

## 2019-07-25 DIAGNOSIS — I350 Nonrheumatic aortic (valve) stenosis: Secondary | ICD-10-CM

## 2019-07-25 DIAGNOSIS — I10 Essential (primary) hypertension: Secondary | ICD-10-CM | POA: Diagnosis not present

## 2019-07-25 DIAGNOSIS — R42 Dizziness and giddiness: Secondary | ICD-10-CM

## 2019-07-25 MED ORDER — LISINOPRIL 20 MG PO TABS: 20.0000 mg | ORAL_TABLET | Freq: Every evening | ORAL | 3 refills | Status: DC

## 2019-07-25 MED ORDER — ASPIRIN EC 81 MG PO TBEC: 81.0000 mg | DELAYED_RELEASE_TABLET | Freq: Every day | ORAL | 3 refills | Status: AC

## 2019-07-25 MED ORDER — HYDROCHLOROTHIAZIDE 25 MG PO TABS: 25.0000 mg | ORAL_TABLET | Freq: Every day | ORAL | 3 refills | Status: DC

## 2019-07-25 NOTE — Patient Instructions (Signed)

## 2019-08-12 NOTE — Progress Notes (Signed)
Assessment/Plan:   1.  idiopathic Parkinson's disease, new dx 08/16/19  -Patient did have recent exposure to aripiprazole, but was really only on it about a month, so likely not contributing factor.  -Patient has very strong family history of Parkinson's disease.  We did discuss [facility] genetic testing.  She would like to proceed and we had her sign for that today  -We discussed the diagnosis as well as pathophysiology of the disease.  We discussed treatment options as well as prognostic indicators.  Patient education was provided.  -We discussed that it used to be thought that levodopa would increase risk of melanoma but now it is believed that Parkinsons itself likely increases risk of melanoma. she is to get regular skin checks.  -We talked about medication options as well as potential future surgical options.  We talked about safety in the home.  -We decided to add carbidopa/levodopa 25/100.  1/2 tab tid x 1 wk, then 1/2 in am & noon & 1 at night for a week, then 1/2 in am &1 at noon &night for a week, then 1 po tid.  Risks, benefits, side effects and alternative therapies were discussed.  The opportunity to ask questions was given and they were answered to the best of my ability.  The patient expressed understanding and willingness to follow the outlined treatment protocols.  -she declined PT/OT for now since she will be moving.  She will find that resource in [facility]  -We discussed community resources in the area including patient support groups and community exercise programs for PD and pt education was provided to the patient.  -pt moving to [facility].  Will refer to Dr. Rosine Beat for future care.     Subjective:   Katie Kaiser was seen today in the movement disorders clinic for neurologic consultation at the request of Rita Ohara, MD.  The consultation is for the evaluation of tremor.  Medical records made available to me are reviewed.  Tremor: Yes.     How long has it been going on? 5 years  ago but gotten worse - has had long term sensory nerve damage to that arm from surgical trauma years ago but tremor started 5 years ago  At rest or with activation?  activation  When is it noted the most?  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  Yes.   mother and sister with history of Parkinson's disease  Located where?  L hand;   Affected by caffeine:  Yes.  , and has cut back on caffeine because of it  Affected by alcohol:  Doesn't drink EtOH  Affected by stress:  Yes.    Affected by fatigue:  No.  Spills soup if on spoon:  No.  Spills glass of liquid if full:  No.  Affects ADL's (tying shoes, brushing teeth, etc):  No.  Tremor inducing meds:  Yes.   Abilify started in January, 2020 tremor got worse and she stopped the medication in February.  Other Specific Symptoms:  Voice: a little deeper Sleep: sleeps well  Vivid Dreams:  No. - feels like dreams  Acting out dreams:  Yes.  , some sleep talking per her kids Wet Pillows: No. Postural symptoms:  No.  Falls?  No. Bradykinesia symptoms: no bradykinesia noted Loss of smell:  Yes.   Loss of taste:  Yes.   Urinary Incontinence:  Yes.   (  resolved with oxybutinin) Difficulty Swallowing:  Yes.   Handwriting, micrographia: No. Trouble with ADL's:  No.  Trouble buttoning clothing: Yes.   Depression:  No. - but a little anxious Memory changes:  Yes.   - trouble with word finding Hallucinations:  No.  visual distortions: No. N/V:  No. Lightheaded:  Yes.   but resolved after put on BP meds - seeing cardiology  Syncope: No. Diplopia:  No. Dyskinesia:  No.  Neuroimaging of the brain has not been done in the recent years.  It was done in [facility] in 2016 due to HA.  I don't have the films.  She was told it was normal.    PREVIOUS MEDICATIONS: none to date  ALLERGIES:   Allergies  Allergen Reactions  . Dilaudid [Hydromorphone Hcl]     Lots of itching     CURRENT  MEDICATIONS:  Current Outpatient Medications  Medication Instructions  . aspirin EC 81 mg, Oral, Daily  . buPROPion (WELLBUTRIN XL) 300 mg, Oral, Daily  . carbidopa-levodopa (SINEMET IR) 25-100 MG tablet 1 tablet, Oral, 3 times daily  . diclofenac Sodium (VOLTAREN) 1 % GEL APPLY 2 TO 4 GRAMS TO AFFECTED EXTERNAL AREA FOUR TIMES A DAY  . esomeprazole (NEXIUM) 20 mg, Oral, Daily  . Fish Oil 2 mg, Oral, Daily  . FLUoxetine (PROZAC) 40 mg, Oral, Daily  . hydrochlorothiazide (HYDRODIURIL) 25 mg, Oral, Daily  . lisinopril (ZESTRIL) 20 mg, Oral, Every evening  . oxybutynin (DITROPAN) 2.5-5 mg, Oral, 3 times daily  . Synthroid 175 mcg, Oral, Daily before breakfast  . Vitamin D 1,000 Units, Daily    Objective:   PHYSICAL EXAMINATION:    VITALS:   Vitals:   08/16/19 1338  BP: 138/78  Pulse: 70  Resp: 18  SpO2: 96%  Weight: 240 lb (108.9 kg)  Height: 5\' 4"  (1.626 m)    GEN:  The patient appears stated age and is in NAD. HEENT:  Normocephalic, atraumatic.  The mucous membranes are moist. The superficial temporal arteries are without ropiness or tenderness. CV:  RRR Lungs:  CTAB Neck/HEME:  There are no carotid bruits bilaterally.  Neurological examination:  Orientation: The patient is alert and oriented x3.  Cranial nerves: There is good facial symmetry.  Extraocular muscles are intact. The visual fields are full to confrontational testing. The speech is fluent and clear. Soft palate rises symmetrically and there is no tongue deviation. Hearing is intact to conversational tone. Sensation: Sensation is intact to light touch throughout (facial, trunk, extremities). Vibration is intact at the bilateral big toe. There is no extinction with double simultaneous stimulation.  Motor: Strength is 5/5 in the bilateral upper and lower extremities.   Shoulder shrug is equal and symmetric.  There is no pronator drift. Deep tendon reflexes: Deep tendon reflexes are 2-2+/4 at the bilateral biceps,  triceps, brachioradialis, patella and achilles. Plantar responses are downgoing bilaterally.  Movement examination: Tone: There is normal tone in the bilateral upper extremities.  The tone in the lower extremities is normal.  Abnormal movements: there is LUE rest tremor.  There is RUE rest tremor that can be felt Coordination:  There is  decremation with RAM's, with hand opening and closing on the L, action of "turning in a light bulb" on the L and mild with finger taps on the right.  All other RAMs are normal Gait and Station: The patient has no difficulty arising out of a deep-seated chair without the use of the hands. The patient's stride length  is good but she is wide based.  There is re-emergent tremor on the LUE.  She slightly drags the L leg.      Chemistry      Component Value Date/Time   NA 138 07/07/2019 1443   K 4.3 07/07/2019 1443   CL 99 07/07/2019 1443   CO2 25 07/07/2019 1443   BUN 12 07/07/2019 1443   CREATININE 0.69 07/07/2019 1443   CREATININE 0.71 02/12/2017 1105      Component Value Date/Time   CALCIUM 9.3 07/07/2019 1443   ALKPHOS 107 07/07/2019 1443   AST 16 07/07/2019 1443   ALT 15 07/07/2019 1443   BILITOT 0.3 07/07/2019 1443     Lab Results  Component Value Date   WBC 8.1 11/22/2018   HGB 12.0 11/22/2018   HCT 38.5 11/22/2018   MCV 81 11/22/2018   PLT 254 11/22/2018     Total time spent on today's visit was 70 minutes, including both face-to-face time and nonface-to-face time.  Time included that spent on review of records (prior notes available to me/labs/imaging if pertinent), discussing treatment and goals, answering patient's questions and coordinating care.  Cc:  Rita Ohara, MD

## 2019-08-16 ENCOUNTER — Ambulatory Visit (INDEPENDENT_AMBULATORY_CARE_PROVIDER_SITE_OTHER): Payer: Medicare Other | Admitting: Neurology

## 2019-08-16 ENCOUNTER — Other Ambulatory Visit: Payer: Self-pay

## 2019-08-16 ENCOUNTER — Encounter: Payer: Self-pay | Admitting: Neurology

## 2019-08-16 VITALS — BP 138/78 | HR 70 | Resp 18 | Ht 64.0 in | Wt 240.0 lb

## 2019-08-16 DIAGNOSIS — G20 Parkinson's disease: Secondary | ICD-10-CM

## 2019-08-16 DIAGNOSIS — G20A1 Parkinson's disease without dyskinesia, without mention of fluctuations: Secondary | ICD-10-CM | POA: Insufficient documentation

## 2019-08-16 MED ORDER — CARBIDOPA-LEVODOPA 25-100 MG PO TABS: 1.0000 | ORAL_TABLET | Freq: Three times a day (TID) | ORAL | 1 refills | Status: DC

## 2019-08-16 NOTE — Progress Notes (Signed)
Start time: 10:28 End time: 10:46  Virtual Visit via Video Note  I connected with Katie Kaiser on 08/17/2019 by a video enabled telemedicine application and verified that I am speaking with the correct person using two identifiers.  Location: Patient: home Provider: office   I discussed the limitations of evaluation and management by telemedicine and the availability of in person appointments. The patient expressed understanding and agreed to proceed.  History of Present Illness:  Chief Complaint  Patient presents with  . Cough    VIRTUAL has had cough off and on since pollen started. Also has some drainage in the back her throat. No fever. Afraid to take anything due to bp meds. Doesn't have any taste of smell sense-was told that is due to Parkinson's.   Patient is complaining of "allergy cough" and drainage in the back of her throat.   Also has dry mouth from her oxybutynin (which is "working great")   She is sniffling, sneezing.  Mucus is clear.  Cough is productive of clear mucus.  Denies any shortness of breath.  Denies itchy/watery eyes. She previously had always used Claritin D for her allergies, with good results.  She hasn't taken anything yet, unsure what is safe, due to her blood pressures.  She has had issues with her taste and smell off/on for a couple of years. Food now tastes metallic. The other day a dish tasted very salty to her, and her son said it wasn't salty at all.  Since her AWV she saw Dr. Drenda Freeze changes made. This week she saw Dr. Carles Collet, was diagnosed with PD, and started on carbidopa/levodopa. She hasn't picked this up yet.  PMH, PSH, SH reviewed  Outpatient Encounter Medications as of 08/17/2019  Medication Sig  . aspirin EC 81 MG tablet Take 1 tablet (81 mg total) by mouth daily.  Marland Kitchen buPROPion (WELLBUTRIN XL) 300 MG 24 hr tablet Take 1 tablet (300 mg total) by mouth daily.  . Cholecalciferol (VITAMIN D) 1000 UNITS capsule Take 1,000 Units by  mouth daily.    . diclofenac Sodium (VOLTAREN) 1 % GEL APPLY 2 TO 4 GRAMS TO AFFECTED EXTERNAL AREA FOUR TIMES A DAY  . esomeprazole (NEXIUM) 20 MG capsule Take 20 mg by mouth daily at 12 noon.  Marland Kitchen FLUoxetine (PROZAC) 40 MG capsule Take 1 capsule (40 mg total) by mouth daily.  . hydrochlorothiazide (HYDRODIURIL) 25 MG tablet Take 1 tablet (25 mg total) by mouth daily.  Marland Kitchen lisinopril (ZESTRIL) 20 MG tablet Take 1 tablet (20 mg total) by mouth every evening.  . Omega-3 Fatty Acids (FISH OIL) 1000 MG CAPS Take 2 mg daily by mouth.  . oxybutynin (DITROPAN) 5 MG tablet Take 0.5-1 tablets (2.5-5 mg total) by mouth 3 (three) times daily.  Marland Kitchen SYNTHROID 175 MCG tablet Take 1 tablet (175 mcg total) by mouth daily before breakfast.  . carbidopa-levodopa (SINEMET IR) 25-100 MG tablet Take 1 tablet by mouth 3 (three) times daily. (Patient not taking: Reported on 08/17/2019)   No facility-administered encounter medications on file as of 08/17/2019.   Allergies  Allergen Reactions  . Dilaudid [Hydromorphone Hcl]     Lots of itching    ROS: no fever, chills, dizziness, headaches, chest pain, rashes.  Some change in taste and smell per HPI.  No shortness of breath, bleeding, bruising, rash.   Observations/Objective:  BP (!) 150/85   Pulse 74   Ht '5\' 4"'  (1.626 m)   Wt 237 lb (107.5 kg)   LMP  05/05/1990   BMI 40.68 kg/m   BP Readings from Last 3 Encounters:  08/17/19 (!) 150/85  08/16/19 138/78  07/25/19 110/60   Well-appearing, pleasant female, in good spirits. No significant coughing heard during visit. No sniffling or sneezing. She is alert, oriented, cranial nerves are grossly intact. Normal mood, affect, grooming   Assessment and Plan:  Seasonal allergic rhinitis, unspecified trigger - claritin recommended.  Sinus rinses prn for sinus pain; add flonase if not improving  Cough - guaifenesin +/- DM discussed prn  Essential hypertension - elevated today--had been running around prior to  checking; lower at doctor yesterday  Parkinson's disease [facility]) - diagnosed yesterday by neuro. Hasn't picked up medication yet.   Start plain claritin once daily (not the D version). If you develop any sinus pain or pressure, start doing sinus rinses (either sinus rinse kit or Neti-pot). I recommend you taking guaifenesin (expectorant) to help keep the mucus and phlegm loose/thin.  This should help some with the coughing.  If it isn't enough, then a cough suppressant (dextromethorphan) can also be used.  You can get these together in combination (Mucinex DM or Robitussin DM), or you can take these separately (plain mucinex, containing guaifenesin only and then getting Delsym syrup, which only contains the cough suppressant).  If your allergy symptoms aren't well controlled with these measures, with continued drainage and congestion, then add in a daily nasal steroid such as Flonase.   Follow Up Instructions:    I discussed the assessment and treatment plan with the patient. The patient was provided an opportunity to ask questions and all were answered. The patient agreed with the plan and demonstrated an understanding of the instructions.   The patient was advised to call back or seek an in-person evaluation if the symptoms worsen or if the condition fails to improve as anticipated.  I provided 18 minutes of non-face-to-face time during this encounter.   Vikki Ports, MD

## 2019-08-16 NOTE — Patient Instructions (Addendum)
1. Start Carbidopa Levodopa as follows:  Take 1/2 tablet three times daily, at least 30 minutes before meals (approximately 7am/11am/4pm), for one week  Then take 1/2 tablet in the morning, 1/2 tablet in the afternoon, 1 tablet in the evening, at least 30 minutes before meals, for one week  Then take 1/2 tablet in the morning, 1 tablet in the afternoon, 1 tablet in the evening, at least 30 minutes before meals, for one week  Then take 1 tablet three times daily at 7am/11am/4pm, at least 30 minutes before meals   As a reminder, carbidopa/levodopa can be taken at the same time as a carbohydrate, but we like to have you take your pill either 30 minutes before a protein source or 1 hour after as protein can interfere with carbidopa/levodopa absorption.  2.  We will send a referral to Dr. Dicie Beam Address: [address], [place], [place] [address] Phone: [phone]  3.  We will contact [facility] regarding your genetic testing  The physicians and staff at [facility] are committed to providing excellent care. You may receive a survey requesting feedback about your experience at our office. We strive to receive "very good" responses to the survey questions. If you feel that your experience would prevent you from giving the office a "very good " response, please contact our office to try to remedy the situation. We may be reached at [phone]. Thank you for taking the time out of your busy day to complete the survey.

## 2019-08-17 ENCOUNTER — Encounter: Payer: Self-pay | Admitting: Family Medicine

## 2019-08-17 ENCOUNTER — Ambulatory Visit (INDEPENDENT_AMBULATORY_CARE_PROVIDER_SITE_OTHER): Payer: Medicare Other | Admitting: Family Medicine

## 2019-08-17 VITALS — BP 150/85 | HR 74 | Ht 64.0 in | Wt 237.0 lb

## 2019-08-17 DIAGNOSIS — R059 Cough, unspecified: Secondary | ICD-10-CM

## 2019-08-17 DIAGNOSIS — R05 Cough: Secondary | ICD-10-CM | POA: Diagnosis not present

## 2019-08-17 DIAGNOSIS — G20 Parkinson's disease: Secondary | ICD-10-CM | POA: Diagnosis not present

## 2019-08-17 DIAGNOSIS — I10 Essential (primary) hypertension: Secondary | ICD-10-CM

## 2019-08-17 DIAGNOSIS — J302 Other seasonal allergic rhinitis: Secondary | ICD-10-CM | POA: Diagnosis not present

## 2019-08-17 NOTE — Patient Instructions (Signed)
  Start plain claritin once daily (not the D version). If you develop any sinus pain or pressure, start doing sinus rinses (either sinus rinse kit or Neti-pot). I recommend you taking guaifenesin (expectorant) to help keep the mucus and phlegm loose/thin.  This should help some with the coughing.  If it isn't enough, then a cough suppressant (dextromethorphan) can also be used.  You can get these together in combination (Mucinex DM or Robitussin DM), or you can take these separately (plain mucinex, containing guaifenesin only and then getting Delsym syrup, which only contains the cough suppressant).  If your allergy symptoms aren't well controlled with these measures, with continued drainage and congestion, then add in a daily nasal steroid such as Flonase.  Contact us if you develop fever, discolored mucus/phlegm, sinus pain, worsening cough, shortness of breath or other concerns.

## 2019-08-25 ENCOUNTER — Other Ambulatory Visit: Payer: Self-pay | Admitting: Physician Assistant

## 2019-09-08 ENCOUNTER — Ambulatory Visit (INDEPENDENT_AMBULATORY_CARE_PROVIDER_SITE_OTHER): Payer: Medicare Other | Admitting: Family Medicine

## 2019-09-08 ENCOUNTER — Other Ambulatory Visit: Payer: Self-pay

## 2019-09-08 ENCOUNTER — Encounter: Payer: Self-pay | Admitting: Family Medicine

## 2019-09-08 VITALS — BP 120/72 | HR 76 | Temp 97.6°F | Ht 64.0 in | Wt 235.0 lb

## 2019-09-08 DIAGNOSIS — S40921A Unspecified superficial injury of right upper arm, initial encounter: Secondary | ICD-10-CM | POA: Diagnosis not present

## 2019-09-08 DIAGNOSIS — W5503XA Scratched by cat, initial encounter: Secondary | ICD-10-CM

## 2019-09-08 DIAGNOSIS — W5501XA Bitten by cat, initial encounter: Secondary | ICD-10-CM

## 2019-09-08 MED ORDER — AMOXICILLIN-POT CLAVULANATE 875-125 MG PO TABS: 1.0000 | ORAL_TABLET | Freq: Two times a day (BID) | ORAL | 0 refills | Status: AC

## 2019-09-08 NOTE — Progress Notes (Signed)
Chief Complaint  Patient presents with  . Abrasion    cat scratches-yesterday and wants to make she is not infected.    Dog had abscessed tooth.  Went for surgery yesterday, and he never woke up after. 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  Has 2 cats at home, that never mix with the dogs. After getting back home from the vet, 1 cat got out with the dogs, started attacking the boxer. She was chasing the cat, she pulled the cat off, and got scratched up. R and and arm are injured--bruised, scratched.  She washed immediately with soap and water, and once with hydrogen peroxide. Tetanus UTD, 2014  PMH, PSH, SH reviewed  Outpatient Encounter Medications as of 09/08/2019  Medication Sig  . aspirin EC 81 MG tablet Take 1 tablet (81 mg total) by mouth daily.  Marland Kitchen buPROPion (WELLBUTRIN XL) 300 MG 24 hr tablet Take 1 tablet (300 mg total) by mouth daily.  . carbidopa-levodopa (SINEMET IR) 25-100 MG tablet Take 1 tablet by mouth 3 (three) times daily.  . Cholecalciferol (VITAMIN D) 1000 UNITS capsule Take 1,000 Units by mouth daily.    . diclofenac Sodium (VOLTAREN) 1 % GEL APPLY 2 TO 4 GRAMS TO AFFECTED EXTERNAL AREA FOUR TIMES A DAY  . esomeprazole (NEXIUM) 20 MG capsule Take 20 mg by mouth daily at 12 noon.  Marland Kitchen FLUoxetine (PROZAC) 40 MG capsule Take 1 capsule (40 mg total) by mouth daily.  . hydrochlorothiazide (HYDRODIURIL) 25 MG tablet Take 1 tablet (25 mg total) by mouth daily.  Marland Kitchen lisinopril (ZESTRIL) 20 MG tablet TAKE 1 TABLET BY MOUTH ONCE DAILY IN THE EVENING  . Omega-3 Fatty Acids (FISH OIL) 1000 MG CAPS Take 2 mg daily by mouth.  . oxybutynin (DITROPAN) 5 MG tablet Take 0.5-1 tablets (2.5-5 mg total) by mouth 3 (three) times daily.  Marland Kitchen SYNTHROID 175 MCG tablet Take 1 tablet (175 mcg total) by mouth daily before breakfast.  . amoxicillin-clavulanate (AUGMENTIN) 875-125 MG tablet Take 1 tablet by mouth 2 (two) times daily.   No  facility-administered encounter medications on file as of 09/08/2019.   (NOT taking Augmentin prior to today's visit)  Allergies  Allergen Reactions  . Dilaudid [Hydromorphone Hcl]     Lots of itching    ROS: No fever or chills, URI symptoms chest pain, shortness of breath. +grief 20# intentional weight loss  PHYSICAL EXAM:  BP 120/72   Pulse 76   Temp 97.6 F (36.4 C) (Other (Comment))   Ht 5\' 4"  (1.626 m)   Wt 235 lb (106.6 kg)   LMP 05/05/1990   BMI 40.34 kg/m   Well developed female, in no distress. Somewhat upset about her dog, but not tearful, full range of affect.  RUE: Multiple superficial scratches (linear abrasions) on the ulnar aspect of the palm, as well as the upper arm (with purple ecchymoses) and the lateral forearm. On the anterior aspect of the forearm there are 2 areas that have round red and slightly swollen lesions with central scab.  These are not linear, but more punctate, concerning for possible puncture wound/bite. There are somewhat firm and very tender. The larger one measures 3cm in size, and the two smaller ones located medial to this are 1cm in size (of swelling and slight erythema, the puncture is very small). There is no streaking or other redness of the forearm.  ASSESSMENT/PLAN:  Cat scratch - multiple scratches on RUE, no  evidence of infection or cellulitis  Cat bite, initial encounter - on the forearm, there is area that is suspected to be a bite, rather than scratch, so will cover with Augmentin - Plan: amoxicillin-clavulanate (AUGMENTIN) 875-125 MG tablet   Cat scratches, cannot rule out puncture wounds. Will treat preventatively Augmentin BID #14  Risks/SE reviewed. S/sx cellulitis reviewed. F/u for recheck if worsening.

## 2019-09-08 NOTE — Patient Instructions (Signed)
Take Augmentin twice daily for 7 days to prevent infection in case there was a puncture wound to the arm/hand. Use bacitracin or other topical antibacterial ointment to all open wounds until healed. Keep them clean. You do not need to keep them covered.  Follow up if you notice increasing redness, swelling, pain, fever, or any swollen glands (armpit, back of your neck).

## 2019-10-15 DIAGNOSIS — G8911 Acute pain due to trauma: Secondary | ICD-10-CM | POA: Diagnosis not present

## 2019-10-15 DIAGNOSIS — S52501A Unspecified fracture of the lower end of right radius, initial encounter for closed fracture: Secondary | ICD-10-CM | POA: Diagnosis not present

## 2019-10-15 DIAGNOSIS — S0990XA Unspecified injury of head, initial encounter: Secondary | ICD-10-CM | POA: Diagnosis not present

## 2019-10-18 ENCOUNTER — Telehealth: Payer: Self-pay

## 2019-10-18 DIAGNOSIS — S52509A Unspecified fracture of the lower end of unspecified radius, initial encounter for closed fracture: Secondary | ICD-10-CM | POA: Diagnosis not present

## 2019-10-18 NOTE — Telephone Encounter (Signed)
I received a fax from [place] for a refill on the pts. hctz 25mg , Bupropion XL 300mg , and Fluoxetine 40mg  pt. Last apt was 09/08/19 and has no future apt.

## 2019-10-18 NOTE — Telephone Encounter (Signed)
Per documentation in chart--these should all have refills remaining.  They were all written in 07/2019, with 1-3 refills given.  No idea why they are faxing refill request, they shouldn't be needed.

## 2019-10-19 ENCOUNTER — Other Ambulatory Visit: Payer: Self-pay

## 2019-10-19 ENCOUNTER — Telehealth: Payer: Self-pay | Admitting: *Deleted

## 2019-10-19 ENCOUNTER — Telehealth: Payer: Self-pay | Admitting: Neurology

## 2019-10-19 DIAGNOSIS — E039 Hypothyroidism, unspecified: Secondary | ICD-10-CM

## 2019-10-19 DIAGNOSIS — N3281 Overactive bladder: Secondary | ICD-10-CM

## 2019-10-19 DIAGNOSIS — I10 Essential (primary) hypertension: Secondary | ICD-10-CM

## 2019-10-19 DIAGNOSIS — F3342 Major depressive disorder, recurrent, in full remission: Secondary | ICD-10-CM

## 2019-10-19 MED ORDER — OXYBUTYNIN CHLORIDE 5 MG PO TABS: 2.5000 mg | ORAL_TABLET | Freq: Three times a day (TID) | ORAL | 0 refills | Status: AC

## 2019-10-19 MED ORDER — FLUOXETINE HCL 40 MG PO CAPS: 40.0000 mg | ORAL_CAPSULE | Freq: Every day | ORAL | 0 refills | Status: AC

## 2019-10-19 MED ORDER — CARBIDOPA-LEVODOPA 25-100 MG PO TABS: 1.0000 | ORAL_TABLET | Freq: Three times a day (TID) | ORAL | 0 refills | Status: DC

## 2019-10-19 MED ORDER — BUPROPION HCL ER (XL) 300 MG PO TB24
300.0000 mg | ORAL_TABLET | Freq: Every day | ORAL | 0 refills | Status: AC
Start: 2019-10-19 — End: ?

## 2019-10-19 MED ORDER — SYNTHROID 175 MCG PO TABS: 175.0000 ug | ORAL_TABLET | Freq: Every day | ORAL | 0 refills | Status: DC

## 2019-10-19 MED ORDER — HYDROCHLOROTHIAZIDE 25 MG PO TABS: 25.0000 mg | ORAL_TABLET | Freq: Every day | ORAL | 0 refills | Status: DC

## 2019-10-19 NOTE — Telephone Encounter (Signed)
Patient is down at her son's house and she fell on Saturday and broke her right wrist in 3 places. She is having surgery next week and only brought enough medication for the weekend. She will be there for a couple of weeks and would like to know if we can call in a month's supply of her meds to [facility] in [place], [place]. She needs: Buproprion Synthroid Prozac Oxybutynin HCTZ She also needs her Parkinson's medication, I let her know she would need to contact Dr. Doristine Devoid office.

## 2019-10-19 NOTE — Telephone Encounter (Signed)
Patient called in stating she is out of town and had fallen and hurt her wrist. She is now having to have surgery on her wrist and cannot get home to get her carbidopa-levodopa. She would like to have about a month's supply called in to the [facility] in [place], [place].

## 2019-10-19 NOTE — Telephone Encounter (Signed)
[place] for 30d

## 2019-10-19 NOTE — Telephone Encounter (Signed)
Done

## 2019-10-24 DIAGNOSIS — I10 Essential (primary) hypertension: Secondary | ICD-10-CM | POA: Diagnosis not present

## 2019-10-24 DIAGNOSIS — F418 Other specified anxiety disorders: Secondary | ICD-10-CM | POA: Diagnosis not present

## 2019-10-24 DIAGNOSIS — E669 Obesity, unspecified: Secondary | ICD-10-CM | POA: Diagnosis not present

## 2019-10-24 DIAGNOSIS — S52571A Other intraarticular fracture of lower end of right radius, initial encounter for closed fracture: Secondary | ICD-10-CM | POA: Diagnosis not present

## 2019-10-24 DIAGNOSIS — Z87891 Personal history of nicotine dependence: Secondary | ICD-10-CM | POA: Diagnosis not present

## 2019-10-24 DIAGNOSIS — E079 Disorder of thyroid, unspecified: Secondary | ICD-10-CM | POA: Diagnosis not present

## 2019-10-24 DIAGNOSIS — Z79890 Hormone replacement therapy (postmenopausal): Secondary | ICD-10-CM | POA: Diagnosis not present

## 2019-10-24 DIAGNOSIS — G20 Parkinson's disease: Secondary | ICD-10-CM | POA: Diagnosis not present

## 2019-10-24 DIAGNOSIS — Z7982 Long term (current) use of aspirin: Secondary | ICD-10-CM | POA: Diagnosis not present

## 2019-10-24 DIAGNOSIS — Z79899 Other long term (current) drug therapy: Secondary | ICD-10-CM | POA: Diagnosis not present

## 2019-10-24 DIAGNOSIS — Z6837 Body mass index (BMI) 37.0-37.9, adult: Secondary | ICD-10-CM | POA: Diagnosis not present

## 2019-10-25 DIAGNOSIS — R509 Fever, unspecified: Secondary | ICD-10-CM | POA: Diagnosis not present

## 2019-10-25 DIAGNOSIS — R05 Cough: Secondary | ICD-10-CM | POA: Diagnosis not present

## 2019-10-31 ENCOUNTER — Telehealth: Payer: Self-pay | Admitting: Neurology

## 2019-10-31 MED ORDER — CARBIDOPA-LEVODOPA ER 25-100 MG PO TBCR: 1.0000 | EXTENDED_RELEASE_TABLET | Freq: Three times a day (TID) | ORAL | 0 refills | Status: AC

## 2019-10-31 NOTE — Telephone Encounter (Signed)
D/c carbidopa/levodopa 25/100 IR Start carbidopa/levodopa 25/100 CR, 1 po tid at 8am/noon/4pm.  #90.  RF 0 since has appt in [place].  See if she tolerates it better

## 2019-10-31 NOTE — Telephone Encounter (Signed)
Spoke with patient and gave her Dr Doristine Devoid recommendations. She voiced understanding and thanked Dr Tat very much.  Rx(s) sent to pharmacy electronically.

## 2019-10-31 NOTE — Telephone Encounter (Signed)
Left VM and asked pt to call back so can gather more info re medication side effects.

## 2019-10-31 NOTE — Telephone Encounter (Signed)
Patient called to report extreme "nausea" while taking carbidopa-levodopa.  [facility] (pt is at [facility])

## 2019-10-31 NOTE — Telephone Encounter (Signed)
Patient states ever since she has been taking this medication and has been sick. She states she has taking this medication every way possible but the nausea is unbearable. She states the nausea is very awful and there is no relief from it. She states she called the pharmacy for adviceand was told she can not stop this medication with out speaking with her doctor. Patient states this medication leaves a bad taste in her mouth. She states she feels awful multiple times. She states she wants something different. She states this medication stopped the shaking but she needs some relief for the nausea. Advised patient that I would speak with Dr Tat and get back with her. She voiced understanding.

## 2019-10-31 NOTE — Telephone Encounter (Signed)
Has she moved to [place]?  We sent a referral to Dr. Rosine Beat in [place] for her as she was not going to be here any longer.  If so, does she have a follow up there Times she is taking the medication and with or without food?

## 2019-10-31 NOTE — Telephone Encounter (Signed)
Patient states she is in [place] and has an appt November 28, 2019. She states she should have told you several weeks ago. She states she has a bitter aftertaste after taking the medication.  Patient states maybe you  her something for nausea. Patient apologizes for sound so desperate but she just feels terrible.   Patient states she takes her meds at 1 at 8am 1 at 2pm 1 at no later than 7pm

## 2019-11-17 ENCOUNTER — Other Ambulatory Visit: Payer: Self-pay | Admitting: Family Medicine

## 2019-11-17 DIAGNOSIS — E039 Hypothyroidism, unspecified: Secondary | ICD-10-CM

## 2019-11-28 DIAGNOSIS — G20 Parkinson's disease: Secondary | ICD-10-CM | POA: Diagnosis not present

## 2019-11-30 DIAGNOSIS — M25562 Pain in left knee: Secondary | ICD-10-CM | POA: Diagnosis not present

## 2019-11-30 DIAGNOSIS — M1712 Unilateral primary osteoarthritis, left knee: Secondary | ICD-10-CM | POA: Diagnosis not present

## 2019-11-30 DIAGNOSIS — M25561 Pain in right knee: Secondary | ICD-10-CM | POA: Diagnosis not present

## 2019-12-13 DIAGNOSIS — M1711 Unilateral primary osteoarthritis, right knee: Secondary | ICD-10-CM | POA: Diagnosis not present

## 2019-12-13 DIAGNOSIS — M1712 Unilateral primary osteoarthritis, left knee: Secondary | ICD-10-CM | POA: Diagnosis not present

## 2020-01-03 DIAGNOSIS — J343 Hypertrophy of nasal turbinates: Secondary | ICD-10-CM | POA: Diagnosis not present

## 2020-01-03 DIAGNOSIS — J310 Chronic rhinitis: Secondary | ICD-10-CM | POA: Diagnosis not present

## 2020-01-03 DIAGNOSIS — R430 Anosmia: Secondary | ICD-10-CM | POA: Diagnosis not present

## 2020-01-03 DIAGNOSIS — J342 Deviated nasal septum: Secondary | ICD-10-CM | POA: Diagnosis not present

## 2020-01-03 DIAGNOSIS — J3489 Other specified disorders of nose and nasal sinuses: Secondary | ICD-10-CM | POA: Diagnosis not present

## 2020-01-17 ENCOUNTER — Telehealth: Payer: Self-pay | Admitting: *Deleted

## 2020-01-17 DIAGNOSIS — E039 Hypothyroidism, unspecified: Secondary | ICD-10-CM

## 2020-01-17 MED ORDER — SYNTHROID 175 MCG PO TABS
ORAL_TABLET | ORAL | 0 refills | Status: AC
Start: 2020-01-17 — End: ?

## 2020-01-17 NOTE — Telephone Encounter (Signed)
That's fine (happy to send more if she would like, like a full month's prescription)

## 2020-01-17 NOTE — Telephone Encounter (Signed)
Done

## 2020-01-17 NOTE — Telephone Encounter (Signed)
Patient has appt with new family practice next week in [place], wanted to know if we can call in #14 of her synthroid to hold her until she sees new doctor? Uses [facility]. Thanks.

## 2020-01-19 ENCOUNTER — Telehealth: Payer: Self-pay

## 2020-01-19 NOTE — Telephone Encounter (Signed)
Referral  Type of referral: Neurology  Provider Name: Dr Audie Box  Phone: [phone]  Fax: [phone]  Address: n/a  Appointment Date and Time: 11/28/19 at 2pm

## 2020-01-20 DIAGNOSIS — Z23 Encounter for immunization: Secondary | ICD-10-CM | POA: Diagnosis not present

## 2020-01-25 ENCOUNTER — Telehealth: Payer: Self-pay | Admitting: Family Medicine

## 2020-01-25 DIAGNOSIS — I10 Essential (primary) hypertension: Secondary | ICD-10-CM

## 2020-01-25 MED ORDER — HYDROCHLOROTHIAZIDE 25 MG PO TABS: 25.0000 mg | ORAL_TABLET | Freq: Every day | ORAL | 0 refills | Status: AC

## 2020-01-25 NOTE — Telephone Encounter (Signed)
[place] for #30 HCTZ

## 2020-01-25 NOTE — Telephone Encounter (Signed)
°  Pt called she needs refill on HCTZ  She has appt with her new MD in [place] on 02/08/20   She needs it sent to [facility]

## 2020-01-25 NOTE — Telephone Encounter (Signed)
Done

## 2020-02-08 ENCOUNTER — Other Ambulatory Visit: Payer: Self-pay | Admitting: Family Medicine

## 2020-02-08 DIAGNOSIS — Z6834 Body mass index (BMI) 34.0-34.9, adult: Secondary | ICD-10-CM | POA: Diagnosis not present

## 2020-02-08 DIAGNOSIS — Z1231 Encounter for screening mammogram for malignant neoplasm of breast: Secondary | ICD-10-CM | POA: Diagnosis not present

## 2020-02-08 DIAGNOSIS — M1712 Unilateral primary osteoarthritis, left knee: Secondary | ICD-10-CM | POA: Diagnosis not present

## 2020-02-08 DIAGNOSIS — I10 Essential (primary) hypertension: Secondary | ICD-10-CM | POA: Diagnosis not present

## 2020-02-08 DIAGNOSIS — E662 Morbid (severe) obesity with alveolar hypoventilation: Secondary | ICD-10-CM | POA: Diagnosis not present

## 2020-02-08 DIAGNOSIS — Z23 Encounter for immunization: Secondary | ICD-10-CM | POA: Diagnosis not present

## 2020-02-08 DIAGNOSIS — E039 Hypothyroidism, unspecified: Secondary | ICD-10-CM | POA: Diagnosis not present

## 2020-02-08 DIAGNOSIS — F3341 Major depressive disorder, recurrent, in partial remission: Secondary | ICD-10-CM | POA: Diagnosis not present

## 2020-02-08 DIAGNOSIS — G20 Parkinson's disease: Secondary | ICD-10-CM | POA: Diagnosis not present

## 2020-02-08 DIAGNOSIS — N3281 Overactive bladder: Secondary | ICD-10-CM | POA: Diagnosis not present

## 2020-02-21 DIAGNOSIS — R519 Headache, unspecified: Secondary | ICD-10-CM | POA: Diagnosis not present

## 2020-02-21 DIAGNOSIS — R251 Tremor, unspecified: Secondary | ICD-10-CM | POA: Diagnosis not present

## 2020-02-21 DIAGNOSIS — Z87891 Personal history of nicotine dependence: Secondary | ICD-10-CM | POA: Diagnosis not present

## 2020-02-21 DIAGNOSIS — G20 Parkinson's disease: Secondary | ICD-10-CM | POA: Diagnosis not present

## 2020-02-21 DIAGNOSIS — S0990XA Unspecified injury of head, initial encounter: Secondary | ICD-10-CM | POA: Diagnosis not present

## 2020-02-21 DIAGNOSIS — Z833 Family history of diabetes mellitus: Secondary | ICD-10-CM | POA: Diagnosis not present

## 2020-03-01 DIAGNOSIS — Z1231 Encounter for screening mammogram for malignant neoplasm of breast: Secondary | ICD-10-CM | POA: Diagnosis not present

## 2020-03-07 DIAGNOSIS — G20 Parkinson's disease: Secondary | ICD-10-CM | POA: Diagnosis not present

## 2020-03-09 DIAGNOSIS — Z96651 Presence of right artificial knee joint: Secondary | ICD-10-CM | POA: Diagnosis not present

## 2020-03-09 DIAGNOSIS — M1712 Unilateral primary osteoarthritis, left knee: Secondary | ICD-10-CM | POA: Diagnosis not present

## 2020-03-09 DIAGNOSIS — R52 Pain, unspecified: Secondary | ICD-10-CM | POA: Diagnosis not present

## 2020-03-09 DIAGNOSIS — M25562 Pain in left knee: Secondary | ICD-10-CM | POA: Diagnosis not present

## 2020-03-09 DIAGNOSIS — E669 Obesity, unspecified: Secondary | ICD-10-CM | POA: Diagnosis not present

## 2020-03-15 DIAGNOSIS — E669 Obesity, unspecified: Secondary | ICD-10-CM | POA: Diagnosis not present

## 2020-03-15 DIAGNOSIS — M1712 Unilateral primary osteoarthritis, left knee: Secondary | ICD-10-CM | POA: Diagnosis not present

## 2020-03-23 DIAGNOSIS — M1712 Unilateral primary osteoarthritis, left knee: Secondary | ICD-10-CM | POA: Diagnosis not present

## 2020-03-23 DIAGNOSIS — E669 Obesity, unspecified: Secondary | ICD-10-CM | POA: Diagnosis not present

## 2020-04-05 DIAGNOSIS — R430 Anosmia: Secondary | ICD-10-CM | POA: Diagnosis not present

## 2020-04-05 DIAGNOSIS — J310 Chronic rhinitis: Secondary | ICD-10-CM | POA: Diagnosis not present

## 2020-04-06 DIAGNOSIS — J310 Chronic rhinitis: Secondary | ICD-10-CM | POA: Diagnosis not present

## 2020-04-20 ENCOUNTER — Other Ambulatory Visit: Payer: Self-pay | Admitting: Family Medicine

## 2020-04-20 DIAGNOSIS — E039 Hypothyroidism, unspecified: Secondary | ICD-10-CM

## 2020-04-20 NOTE — Telephone Encounter (Signed)
Pt has moved away from [place] and no longer a patient here. I will deny as she does not know why we got the request

## 2020-05-28 ENCOUNTER — Encounter: Payer: Self-pay | Admitting: Family Medicine

## 2020-05-28 ENCOUNTER — Other Ambulatory Visit: Payer: Self-pay | Admitting: Interventional Cardiology

## 2020-05-28 ENCOUNTER — Telehealth: Payer: Self-pay | Admitting: *Deleted

## 2020-05-28 ENCOUNTER — Other Ambulatory Visit: Payer: Self-pay | Admitting: Family Medicine

## 2020-05-28 DIAGNOSIS — I10 Essential (primary) hypertension: Secondary | ICD-10-CM

## 2020-05-28 NOTE — Telephone Encounter (Signed)
Called patient and she was very upset. She is asking for a letter stating that she had the vaccines and the dates (she does have a vaccine card) but needs a letter from you, since you have the information in her chart. She simply wants the letter to state this and if you can also just state the [facility] feels that it is too risky for her to go back to work, not that you feel this. That in her opinion it is not safe due to her age. If you are not willing to state her opinion can you at least provide a letter stating she is fully vaccinated with the dates?

## 2020-05-28 NOTE — Telephone Encounter (Signed)
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 She called asking for another letter. This one to state that she now has all 3 Covid vaccines with the dates. Also needs to state that she is at high risk for covid and is unable to return to work. Needs emailed to [email or web address]

## 2020-05-28 NOTE — Telephone Encounter (Signed)
I wrote a letter with the dates of her vaccines.  It is not my place to write her opinion in a letter.  Letters are for me to provide facts or MY medical opinion. If she needs anything further, she needs to ask her current PCP (who should also have access to these dates). I routed it to you--not sure if she automatically gets the letter through [facility] or if you need to do something else for her to get it.

## 2020-05-28 NOTE — Telephone Encounter (Signed)
I cannot write this letter. She is fully vaccinated and doesn't have contraindication to wearing a mask

## 2020-05-29 ENCOUNTER — Other Ambulatory Visit: Payer: Self-pay | Admitting: Family Medicine

## 2020-05-29 DIAGNOSIS — I10 Essential (primary) hypertension: Secondary | ICD-10-CM

## 2020-06-20 ENCOUNTER — Telehealth: Payer: Self-pay | Admitting: Family Medicine

## 2020-06-20 ENCOUNTER — Encounter: Payer: Self-pay | Admitting: Family Medicine

## 2020-06-20 NOTE — Telephone Encounter (Signed)
Please see previous messages from this patient.  She moved to [place] and established with a new doctor (I am no longer her PCP). Feel free to come and talk to me about her, if needed

## 2020-06-20 NOTE — Telephone Encounter (Signed)
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  She has no money and had to move in with her son.  She is very afraid to be in public due to her Parkinsons and other health conditions along with her age.  She is requesting a letter for the [employer] program regarding her health conditions.

## 2020-06-21 ENCOUNTER — Encounter: Payer: Self-pay | Admitting: Family Medicine

## 2021-06-13 ENCOUNTER — Encounter: Payer: Self-pay | Admitting: Internal Medicine
